# Patient Record
Sex: Male | Born: 1985
Health system: Southern US, Community
[De-identification: ages and names within clinical notes are randomized; demographics above are authoritative.]

## PROBLEM LIST (undated history)

## (undated) DIAGNOSIS — I10 Essential (primary) hypertension: Secondary | ICD-10-CM

## (undated) DIAGNOSIS — M869 Osteomyelitis, unspecified: Secondary | ICD-10-CM

## (undated) DIAGNOSIS — E119 Type 2 diabetes mellitus without complications: Secondary | ICD-10-CM

## (undated) HISTORY — PX: NO PAST SURGERIES: SHX2092

---

## 2013-09-25 ENCOUNTER — Encounter (HOSPITAL_COMMUNITY): Payer: Self-pay | Admitting: Emergency Medicine

## 2013-09-25 ENCOUNTER — Emergency Department (HOSPITAL_COMMUNITY)
Admission: EM | Admit: 2013-09-25 | Discharge: 2013-09-26 | Disposition: A | Discharge status: Home / Self Care | Payer: Self-pay | Attending: Emergency Medicine | Admitting: Emergency Medicine

## 2013-09-25 DIAGNOSIS — R197 Diarrhea, unspecified: Secondary | ICD-10-CM | POA: Insufficient documentation

## 2013-09-25 DIAGNOSIS — J029 Acute pharyngitis, unspecified: Secondary | ICD-10-CM | POA: Insufficient documentation

## 2013-09-25 DIAGNOSIS — J020 Streptococcal pharyngitis: Secondary | ICD-10-CM | POA: Insufficient documentation

## 2013-09-25 DIAGNOSIS — R112 Nausea with vomiting, unspecified: Secondary | ICD-10-CM | POA: Insufficient documentation

## 2013-09-25 MED ORDER — ONDANSETRON HCL 4 MG PO TABS
4.0000 mg | ORAL_TABLET | Freq: Once | ORAL | Status: AC
  Administered 2013-09-26: 4 mg via ORAL
  Filled 2013-09-25: qty 1

## 2013-09-25 NOTE — ED Notes (Signed)
Pt reports sore throat and n/v/d x 24 hours.  Pt reports hx of strep last year.

## 2013-09-25 NOTE — ED Provider Notes (Signed)
CSN: [id number]     Arrival date & time 09/25/13  2238 History   First MD Initiated Contact with Patient 09/25/13 2324     Chief Complaint  Patient presents with  . Sore Throat  . Vomiting  . Diarrhea    HPI Patient presents with sore throat and N/V/D since yesterday. Pain has been constant. He is able to keep down fluids and 2 meals today. Emesis is nonbilious, nonbloody. Patient reports diarrhea without blood. Patient is hydrating with Gatorade. Patient reports having strep last year with a similar presentation. Patient denies cough, rhinorrhea but some congestion. Patient denies palpitations, CP, dyspnea, abdominal pain, weakness.  History reviewed. No pertinent past medical history. History reviewed. No pertinent past surgical history. No family history on file. History  Substance Use Topics  . Smoking status: Never Smoker   . Smokeless tobacco: Not on file  . Alcohol Use: No    Review of Systems  Constitutional: Positive for chills. Negative for fever.  HENT: Positive for congestion. Negative for rhinorrhea.   Respiratory: Negative for cough and shortness of breath.   Cardiovascular: Negative for chest pain and palpitations.  Gastrointestinal: Positive for nausea, vomiting and diarrhea. Negative for abdominal pain and blood in stool.  Genitourinary: Negative for dysuria and hematuria.  Musculoskeletal: Negative for back pain.  Neurological: Negative for weakness.      Allergies  Review of patient's allergies indicates no known allergies.  Home Medications   Prior to Admission medications   Medication Sig Start Date End Date Taking? Authorizing Provider  amoxicillin (AMOXIL) 500 MG capsule Take 1 capsule (500 mg total) by mouth 2 (two) times daily. 09/26/13   Louann SjogrenVictoria L Dayane Hillenburg, PA-C  promethazine (PHENERGAN) 25 MG tablet Take 1 tablet (25 mg total) by mouth every 6 (six) hours as needed for nausea or vomiting. 09/26/13   Benetta SparVictoria L Krystelle Prashad, PA-C   BP 146/85  Pulse 88   Temp(Src) 98.8 F (37.1 C) (Oral)  Resp 18  SpO2 99% Physical Exam  Vitals reviewed. Constitutional: He is oriented to person, place, and time. He appears well-developed and well-nourished. No distress.  HENT:  Head: Normocephalic and atraumatic.  Mouth/Throat: Oropharyngeal exudate present.  Bilateral tonsillar edema and erythema with exudate on left side  Eyes: Conjunctivae and EOM are normal.  Neck: Neck supple.  Cardiovascular: Normal rate, regular rhythm and normal heart sounds.   Pulmonary/Chest: Effort normal and breath sounds normal. No respiratory distress. He has no wheezes.  Abdominal: Soft. Bowel sounds are normal. He exhibits no distension. There is no hepatosplenomegaly. There is no tenderness.  Musculoskeletal: Normal range of motion. He exhibits no edema and no tenderness.  Lymphadenopathy:    He has no cervical adenopathy.  Neurological: He is alert and oriented to person, place, and time.  Skin: Skin is warm and dry. He is not diaphoretic.    ED Course  Procedures (including critical care time) Labs Review Labs Reviewed  RAPID STREP SCREEN - Abnormal; Notable for the following:    Streptococcus, Group A Screen (Direct) POSITIVE (*)    All other components within normal limits    Imaging Review No results found.   EKG Interpretation None      MDM   Final diagnoses:  Streptococcal pharyngitis   Patient with PMH of strep pharyngitis presents with one day history of sore throat, N/V/D. Patient is febrile, 100.3 and tachycardic but in is nontoxic and in no distress. Vomiting is not intractable. Patient is tolerating solids and liquids. Rapid  strep positive. Patient is a good candidate for outpatient therapy and is stable for discharge. Continue hydration with fluids with electrolytes. Phenergan PRN. Follow up with PCP.  Discussed return precautions with patient. Discussed all results and patient verbalizes understanding and agrees with  plan.     Louann Sjogren, PA-C 09/26/13 [phone]

## 2013-09-26 LAB — RAPID STREP SCREEN (MED CTR MEBANE ONLY): Streptococcus, Group A Screen (Direct): POSITIVE — AB

## 2013-09-26 MED ORDER — PROMETHAZINE HCL 25 MG PO TABS: 25.0000 mg | ORAL_TABLET | Freq: Four times a day (QID) | ORAL | Status: DC | PRN

## 2013-09-26 MED ORDER — AMOXICILLIN 500 MG PO CAPS
500.0000 mg | ORAL_CAPSULE | Freq: Once | ORAL | Status: AC
  Administered 2013-09-26: 500 mg via ORAL
  Filled 2013-09-26: qty 1

## 2013-09-26 MED ORDER — AMOXICILLIN 500 MG PO CAPS: 500.0000 mg | ORAL_CAPSULE | Freq: Two times a day (BID) | ORAL | Status: DC

## 2013-09-26 NOTE — ED Provider Notes (Signed)
Pharyngitis with associated diarrhea and occasional nausea and vomiting, on exam has purulent exudate in the posterior pharynx, soft abdomen with normal bowel sounds and is well appearing otherwise. Strep is positive, outpatient therapy, diarrhea will be chewed with symptomatic care, patient expresses understanding.  Medical screening examination/treatment/procedure(s) were conducted as a shared visit with non-physician practitioner(s) and myself.  I personally evaluated the patient during the encounter.  Clinical Impression:  Strep pharyngitis      Vida RollerBrian D Quandarius Nill, MD 09/26/13 [phone]

## 2013-09-26 NOTE — Discharge Instructions (Signed)
Return to the emergency room with worsening of symptoms or with symptoms that are concerning, Especially difficulty breathing or swallowing.

## 2013-12-05 ENCOUNTER — Emergency Department (HOSPITAL_COMMUNITY)
Admission: EM | Admit: 2013-12-05 | Discharge: 2013-12-05 | Disposition: A | Discharge status: Home / Self Care | Payer: Self-pay | Attending: Emergency Medicine | Admitting: Emergency Medicine

## 2013-12-05 ENCOUNTER — Encounter (HOSPITAL_COMMUNITY): Payer: Self-pay | Admitting: Emergency Medicine

## 2013-12-05 DIAGNOSIS — J020 Streptococcal pharyngitis: Secondary | ICD-10-CM | POA: Insufficient documentation

## 2013-12-05 DIAGNOSIS — Z792 Long term (current) use of antibiotics: Secondary | ICD-10-CM | POA: Insufficient documentation

## 2013-12-05 LAB — RAPID STREP SCREEN (MED CTR MEBANE ONLY): Streptococcus, Group A Screen (Direct): POSITIVE — AB

## 2013-12-05 MED ORDER — PENICILLIN V POTASSIUM 500 MG PO TABS: 500.0000 mg | ORAL_TABLET | Freq: Four times a day (QID) | ORAL | Status: AC

## 2013-12-05 MED ORDER — GUAIFENESIN 100 MG/5ML PO SYRP: 100.0000 mg | ORAL_SOLUTION | ORAL | Status: DC | PRN

## 2013-12-05 NOTE — Discharge Instructions (Signed)
Pharyngitis °Pharyngitis is redness, pain, and swelling (inflammation) of your pharynx.  °CAUSES  °Pharyngitis is usually caused by infection. Most of the time, these infections are from viruses (viral) and are part of a cold. However, sometimes pharyngitis is caused by bacteria (bacterial). Pharyngitis can also be caused by allergies. Viral pharyngitis may be spread from person to person by coughing, sneezing, and personal items or utensils (cups, forks, spoons, toothbrushes). Bacterial pharyngitis may be spread from person to person by more intimate contact, such as kissing.  °SIGNS AND SYMPTOMS  °Symptoms of pharyngitis include:   °· Sore throat.   °· Tiredness (fatigue).   °· Low-grade fever.   °· Headache. °· Joint pain and muscle aches. °· Skin rashes. °· Swollen lymph nodes. °· Plaque-like film on throat or tonsils (often seen with bacterial pharyngitis). °DIAGNOSIS  °Your health care provider will ask you questions about your illness and your symptoms. Your medical history, along with a physical exam, is often all that is needed to diagnose pharyngitis. Sometimes, a rapid strep test is done. Other lab tests may also be done, depending on the suspected cause.  °TREATMENT  °Viral pharyngitis will usually get better in 3-4 days without the use of medicine. Bacterial pharyngitis is treated with medicines that kill germs (antibiotics).  °HOME CARE INSTRUCTIONS  °· Drink enough water and fluids to keep your urine clear or pale yellow.   °· Only take over-the-counter or prescription medicines as directed by your health care provider:   °¨ If you are prescribed antibiotics, make sure you finish them even if you start to feel better.   °¨ Do not take aspirin.   °· Get lots of rest.   °· Gargle with 8 oz of salt water (½ tsp of salt per 1 qt of water) as often as every 1-2 hours to soothe your throat.   °· Throat lozenges (if you are not at risk for choking) or sprays may be used to soothe your throat. °SEEK MEDICAL  CARE IF:  °· You have large, tender lumps in your neck. °· You have a rash. °· You cough up green, yellow-brown, or bloody spit. °SEEK IMMEDIATE MEDICAL CARE IF:  °· Your neck becomes stiff. °· You drool or are unable to swallow liquids. °· You vomit or are unable to keep medicines or liquids down. °· You have severe pain that does not go away with the use of recommended medicines. °· You have trouble breathing (not caused by a stuffy nose). °MAKE SURE YOU:  °· Understand these instructions. °· Will watch your condition. °· Will get help right away if you are not doing well or get worse. °Document Released: 02/05/2005 Document Revised: 11/26/2012 Document Reviewed: 10/13/2012 °ExitCare® Patient Information ©2015 [employer]. This information is not intended to replace advice given to you by your health care provider. Make sure you discuss any questions you have with your health care provider. ° ° °Emergency Department Resource Guide °1) Find a Doctor and Pay Out of Pocket °Although you won't have to find out who is covered by your insurance plan, it is a good idea to ask around and get recommendations. You will then need to call the office and see if the doctor you have chosen will accept you as a new patient and what types of options they offer for patients who are self-pay. Some doctors offer discounts or will set up payment plans for their patients who do not have insurance, but you will need to ask so you aren't surprised when you get to your appointment. ° °  2) Contact Your Local Health Department °Not all health departments have doctors that can see patients for sick visits, but many do, so it is worth a call to see if yours does. If you don't know where your local health department is, you can check in your phone book. The CDC also has a tool to help you locate your state's health department, and many state websites also have listings of all of their local health departments. ° °3) Find a Walk-in Clinic °If  your illness is not likely to be very severe or complicated, you may want to try a walk in clinic. These are popping up all over the country in pharmacies, drugstores, and shopping centers. They're usually staffed by nurse practitioners or physician assistants that have been trained to treat common illnesses and complaints. They're usually fairly quick and inexpensive. However, if you have serious medical issues or chronic medical problems, these are probably not your best option. ° °No Primary Care Doctor: °- Call [facility] at  832-8000 - they can help you locate a primary care doctor that  accepts your insurance, provides certain services, etc. °- Physician Referral Service- 1-800-533-3463 ° °Chronic Pain Problems: °Organization         Address  Phone   Notes  °[facility]  (336) 297-2271 Patients need to be referred by their primary care doctor.  ° °Medication Assistance: °Organization         Address  Phone   Notes  °[place] [facility] [address] °[place], [place] [address] (336) 641-8030 --Must be a resident of [place] °-- Must have NO insurance coverage whatsoever (no Medicaid/ Medicare, etc.) °-- The pt. MUST have a primary care doctor that directs their care regularly and follows them in the community °  °[facility]  (866) 331-1348   °[employer]  (888) 892-1162   ° °Agencies that provide inexpensive medical care: °Organization         Address  Phone   Notes  °[facility]  (336) 832-8035   °[facility]    (336) 832-7272   °[facility] [address] °[place], [place] [address] (336) 832-4777   °[facility] [address], °[place] (336) 271-4999   °[facility]    (336) 373-0678   °[facility]    (336) 272-1050   °[facility] ° [address], [place] Phone:  (336) 832-4444, Fax:  (336) 832-4440 Hours of  Operation:  9 am - 6 pm, M-F.  Also accepts Medicaid/Medicare and self-pay.  °[facility] ° [address], [place] Phone: (336) 832-3150, Fax: (336) 832-3151. Hours of Operation:  8:30 am - 5:30 pm, M-F.  Also accepts Medicaid and self-pay.  °[facility] [place] [address], [place] Phone: (336) 878-6027   °[facility] [address], [place], [place] (336)723-1848, Ext. 123 Mondays & Thursdays: 7-9 AM.  First 15 patients are seen on a first come, first serve basis. °  ° °Medicaid-accepting [place] Providers: ° °Organization         Address  Phone   Notes  °[facility] [address], [place] (336) 641-2100 Also accepts self-pay patients.  °[facility] [address], [place] ° (336) 856-9996   °[facility] [address], [place] (336) 288-8857   °[facility] [address] [place] [address], [place] (336) 299-7000   °Veita Bland [address], [place]  ° (336) 373-1557 Only accepts [place] Access Medicaid patients after they have their name applied to their card.  ° °Self-Pay (no insurance) in [place]: ° °Organization         Address  Phone   Notes  °Sickle Cell Patients, [facility] [address], [place] (336) 832-1970   °[facility] [address], [place] (336) 832-4400   °[facility] [place] ° [address], [place] (336) 992-4800   °[facility]/Dr. Osei-Bonsu ° [address] [place] [address], [place] or [address], [place] (336) 841-8500 Phone number for both [place] and [place] locations is the same.  °[facility] [address], [place] (336) 299-0000   °[facility] [address] [place] [address], [place] or [address] (336) 852-7530 °(336) 878-2260   °[facility] [address], [place] (336) 350-1642, phone; (336) 294-5005, fax Sees patients 1st and 3rd Saturday of every month.  Must not qualify for public or private insurance (i.e. Medicaid, Medicare, [place] Health Choice, Veterans' Benefits) • Household income should be no more than 200% of the poverty level •The clinic cannot treat you if you are pregnant or think you are pregnant • Sexually transmitted diseases are not treated at the clinic.  ° ° °Dental Care: °Organization         Address  Phone  Notes  °[facility] [address], [place] (336) 641-6152 Accepts children up to age 21 who are enrolled in Medicaid or [place] Health Choice; pregnant women with a Medicaid card; and children who have applied for Medicaid or [place] Health Choice, but were declined, whose parents can pay a reduced fee at time of service.  °[facility] [place]  [address], [place] (336) 641-7733 Accepts children up to age 21 who are enrolled in Medicaid or [place] Health Choice; pregnant women with a Medicaid card; and children who have applied for Medicaid or [place] Health Choice, but were declined, whose parents can pay a reduced fee at time of service.  °[facility] ° [address], [place] (336) 641-4533 Patients are seen by appointment only. Walk-ins are not accepted. [facility] will see patients 18 years of age and older. °Monday - Tuesday (8am-5pm) °Most Wednesdays (8:30-5pm) °$30 per visit, cash only  °[facility] ° [address], [place] (336) 641-4533 Patients are seen by appointment only. Walk-ins are not accepted. [facility] will see patients 18 years of age and older. °One Wednesday Evening (Monthly: Volunteer Based).  $30 per visit, cash only  °[facility]  (919) 537-3737 for adults; Children under age 4, call Graduate Pediatric  Dentistry at ([phone]) [phone]. Children aged 4-14, please call (919) [phone] to request a pediatric application. ° Dental services are provided in all areas of dental care including fillings, crowns and bridges, complete and partial dentures, implants, gum treatment, root canals, and extractions. Preventive care is also provided. Treatment is provided to both adults and children. °Patients are selected via a lottery and there is often a waiting list. °  °[facility] [address], °  [place] ° (336) 763-8833 [email or web address] °  °[facility] [address], [place], [place] (336)723-1848, Ext. 123 Second and Fourth Thursday of each month, opens at 6:30 AM; Clinic ends at 9 AM.  Patients are seen on a first-come first-served basis, and a limited number are seen during each clinic.  ° °[facility] ° [address], [place], [place] (336) 723-7904   Eligibility Requirements °You must have lived in [place], [place], or [place] counties for at least the last three months. °  You cannot be eligible for state or federal sponsored healthcare insurance, including Veterans Administration, Medicaid, or Medicare. °  You generally cannot be eligible for healthcare insurance through your employer.  °  How to apply: °Eligibility screenings are held every Tuesday and Wednesday afternoon from 1:00 pm until 4:00 pm. You do not need an appointment for the interview!  °[facility] [address], [place], [place] 336-631-2330   °[facility]  336-342-8273   °[facility]  336-703-3100   °[facility]  336-570-6415   ° °Behavioral Health Resources in the Community: °Intensive Outpatient Programs °Organization         Address  Phone  Notes  °[facility] [address], [place], [place] 336-878-6098   °[facility] [address], [place], [place] 336-832-9800   °[facility] [address], [place], [place] ° 336-882-2125   °[facility] [address],  °[place], [place] 1-800-853-5163 or 336-641-4981   °Substance Abuse Resources °Organization         Address  Phone  Notes  °[facility]  336-882-2125   °[facility]  336-784-9470   °[facility]  336-285-9073   °[facility]  336-845-3988   °Residential & Outpatient Substance Abuse Program  1-800-659-3381   °Psychological Services °Organization         Address  Phone  Notes  °[facility]  336- 832-9600   °[facility]  336- 378-7881   °[facility] [address], [place] 1-800-853-5163 or 336-641-4981   ° °Mobile Crisis Teams °Organization         Address  Phone  Notes  °[facility], Mobile Crisis Care Unit  1-877-626-1772   °Assertive °[facility] ° [address] [place], [place] 336-834-9664   °Sharon DeEsch [address] °[place] 336-554-5454   ° °Self-Help/Support Groups °Organization         Address  Phone             Notes  °[facility] [place] - variety of support groups  336- 373-1402 Call for more information  °Narcotics Anonymous (NA), [facility] [address], °[place]  2 meetings at this location  ° °Residential Treatment Programs °Organization         Address  Phone  Notes  °[facility] [address],    °[place]  1-866-801-8205   °[facility] ° [address], [place], [place] 704-293-8524   °[facility] [address], [place] 336-845-3988 Admissions: 8am-3pm M-F  °[facility] [address]    °[place], [place] 336-841-1104   °[facility] [address], [place],  336-379-7146   °[facility] [address]  °[place],  336-285-9073   °[facility] - Intensive Outpatient [address],  [place], [place] 336-852-3033   °[facility] [address]  °[place], [place] 1-877-615-2722 or 336-784-9470   °[facility] [address] [place], [place] 336-227-7417 Accepts Medicaid  °[facility] [address]  °[place] 1-800-659-3381 Substance Abuse/Addiction Treatment  ° °[place] Behavioral Health Resources °Organization         Address  Phone  Notes  °[facility]  (888) 581-9988   °Julie Brannon, PhD [address] [place], [place]   (336) 349-5553 or (336) 951-0000   °[facility]   [address] °[place], [place] (336) 349-4454   °[facility] [address], [place], [place] (336) 342-8316 Insurance/Medicaid/sponsorship through [facility]  °[facility] [address]                                    [place], [place] (336) 342-8316 Therapy/tele-psych/case  °[facility] [address]  ° [place], [place] (336) 349-2233    °Dr. Arfeen  (336) 349-4544   °[facility] [place]  [employer] [place] [facility] 1) [address], [place] °2) [address], [place] °3)  [address], [place] (336) 349-3220 °(336) 342-7768 ° °(336) 342-8140   °[place] [facility] (336) 342-1394 or (336) 342-3537 (After Hours)    ° ° ° ° °

## 2013-12-05 NOTE — ED Notes (Signed)
Per pt, states cold symptoms since thurs-throat pain since this am

## 2013-12-05 NOTE — ED Provider Notes (Signed)
CSN: [id number]     Arrival date & time 12/05/13  1043 History   First MD Initiated Contact with Patient 12/05/13 1107     Chief Complaint  Patient presents with  . URI     (Consider location/radiation/quality/duration/timing/severity/associated sxs/prior Treatment) HPI Jerry Arnold is a 28 y.o. male with no significant past medical history who comes in for evaluation of cough and throat pain. Patient states on Thursday he began to have a mild cough that has been persistent through today and now he has an associated sore throat. He has tried TheraFlu and Halls throat lozenges with some relief. He denies productive or bloody cough. No chest pain, shortness of breath or abdominal pain. No sick contacts  History reviewed. No pertinent past medical history. History reviewed. No pertinent past surgical history. No family history on file. History  Substance Use Topics  . Smoking status: Never Smoker   . Smokeless tobacco: Not on file  . Alcohol Use: No    Review of Systems  Constitutional: Negative for fever.  HENT: Positive for sore throat.   Respiratory: Positive for cough. Negative for shortness of breath.   Cardiovascular: Negative for chest pain.  Skin: Negative for rash.      Allergies  Review of patient's allergies indicates no known allergies.  Home Medications   Prior to Admission medications   Medication Sig Start Date End Date Taking? Authorizing Provider  amoxicillin (AMOXIL) 500 MG capsule Take 1 capsule (500 mg total) by mouth 2 (two) times daily. 09/26/13   Louann SjogrenVictoria L Creech, PA-C  promethazine (PHENERGAN) 25 MG tablet Take 1 tablet (25 mg total) by mouth every 6 (six) hours as needed for nausea or vomiting. 09/26/13   Benetta SparVictoria L Creech, PA-C   BP 152/101  Pulse 86  Temp(Src) 99 F (37.2 C) (Oral)  Resp 16  SpO2 100% Physical Exam  Nursing note and vitals reviewed. Constitutional:  Awake, alert, nontoxic appearance.  HENT:  Head: Normocephalic and  atraumatic.  Right Ear: External ear normal.  Left Ear: External ear normal.  Nose: Nose normal.  Mouth/Throat: Oropharynx is clear and moist. No oropharyngeal exudate.  Mild posterior oropharynx erythema with no tonsillar enlargement. No exudate appreciated. Uvula midline. Handling secretions well. Patent airway  Eyes: Right eye exhibits no discharge. Left eye exhibits no discharge.  Neck: Neck supple.  Pulmonary/Chest: Effort normal. He exhibits no tenderness.  Abdominal: Soft. There is no tenderness. There is no rebound.  Musculoskeletal: He exhibits no tenderness.  Baseline ROM, no obvious new focal weakness.  Neurological:  Mental status and motor strength appears baseline for patient and situation.  Skin: No rash noted.  Psychiatric: He has a normal mood and affect.    ED Course  Procedures (including critical care time) Labs Review Labs Reviewed  RAPID STREP SCREEN    Imaging Review No results found.   EKG Interpretation None      MDM  Vitals stable  -afebrile. Recommended outpatient followup of blood pressure Pt resting comfortably in ED. PE not concerning for acute or emergent pathology. [facility] or criteria negative Labwork--rapid strep POSITIVE. Will trxt empirically.  Will treat cough and sore throat symptomatically as etiology is likely viral Will DC with Robitussin cough syrup , ABX and encourage warm salt water gargles Discussed f/u with PCP and return precautions, pt very amenable to plan. Patient stable, in good condition and is appropriate for discharge   Final diagnoses:  Strep pharyngitis        Sharlene MottsBenjamin W Arelly Whittenberg,  PA-C 12/05/13 1245

## 2013-12-05 NOTE — ED Provider Notes (Signed)
Medical screening examination/treatment/procedure(s) were performed by non-physician practitioner and as supervising physician I was immediately available for consultation/collaboration.   EKG Interpretation None        Jerry Arnold H Laural Eiland, MD 12/05/13 1603 

## 2014-02-28 ENCOUNTER — Emergency Department (HOSPITAL_COMMUNITY)
Admission: EM | Admit: 2014-02-28 | Discharge: 2014-02-28 | Disposition: A | Discharge status: Home / Self Care | Payer: Self-pay | Attending: Emergency Medicine | Admitting: Emergency Medicine

## 2014-02-28 ENCOUNTER — Encounter (HOSPITAL_COMMUNITY): Payer: Self-pay | Admitting: Emergency Medicine

## 2014-02-28 DIAGNOSIS — J069 Acute upper respiratory infection, unspecified: Secondary | ICD-10-CM | POA: Insufficient documentation

## 2014-02-28 LAB — RAPID STREP SCREEN (MED CTR MEBANE ONLY): Streptococcus, Group A Screen (Direct): NEGATIVE

## 2014-02-28 NOTE — ED Provider Notes (Signed)
CSN: [id number]     Arrival date & time 02/28/14  0608 History   First MD Initiated Contact with Patient 02/28/14 0701     Chief Complaint  Patient presents with  . Cough  . Sore Throat     (Consider location/radiation/quality/duration/timing/severity/associated sxs/prior Treatment) HPI The patient poor she developed a cough 5 days ago. He denies any shortness of breath in association with that. He reports 2 days later he developed a sore throat. There is pain with swallowing. No difficulty breathing or swallowing. No associated fever. No associated chest pain. Occasionally with cough there is some greenish tinged mucus. History reviewed. No pertinent past medical history. History reviewed. No pertinent past surgical history. History reviewed. No pertinent family history. History  Substance Use Topics  . Smoking status: Never Smoker   . Smokeless tobacco: Not on file  . Alcohol Use: No    Review of Systems Constitutional: No fevers no chills or general malaise GI: No abdominal pain vomiting or diarrhea.  Allergies  Review of patient's allergies indicates no known allergies.  Home Medications   Prior to Admission medications   Medication Sig Start Date End Date Taking? Authorizing Provider  Phenylephrine-APAP-Guaifenesin (MUCINEX FAST-MAX COLD & SINUS) 10-650-400 MG/20ML LIQD Take 20 mLs by mouth every 6 (six) hours as needed (for cold symptoms).   Yes Historical Provider, MD  Pseudoeph-Doxylamine-DM-APAP (NYQUIL PO) Take 2 capsules by mouth at bedtime as needed (for cold symptoms).   Yes Historical Provider, MD  amoxicillin (AMOXIL) 500 MG capsule Take 1 capsule (500 mg total) by mouth 2 (two) times daily. Patient not taking: Reported on 02/28/2014 09/26/13   Louann Sjogren, PA-C  guaifenesin (ROBITUSSIN) 100 MG/5ML syrup Take 5-10 mLs (100-200 mg total) by mouth every 4 (four) hours as needed for cough. Patient not taking: Reported on 02/28/2014 12/05/13   Earle Gell Cartner,  PA-C  promethazine (PHENERGAN) 25 MG tablet Take 1 tablet (25 mg total) by mouth every 6 (six) hours as needed for nausea or vomiting. Patient not taking: Reported on 02/28/2014 09/26/13   Benetta Spar L Creech, PA-C   BP 145/107 mmHg  Pulse 94  Temp(Src) 98.2 F (36.8 C) (Oral)  Resp 16  Ht  (1.854 m)  Wt 245 lb (111.131 kg)  BMI 32.33 kg/m2  SpO2 99% Physical Exam  Constitutional: He is oriented to person, place, and time. He appears well-developed and well-nourished. No distress.  HENT:  Head: Normocephalic and atraumatic.  Nose: Nose normal.  Mouth/Throat: No oropharyngeal exudate.  Bilateral TMs normal without erythema or bulging. Neck is supple without lymphadenopathy.  Eyes: EOM are normal. Pupils are equal, round, and reactive to light. Right eye exhibits no discharge. Left eye exhibits no discharge.  Neck: Neck supple.  Cardiovascular: Normal rate, regular rhythm, normal heart sounds and intact distal pulses.   Pulmonary/Chest: Effort normal and breath sounds normal.  Musculoskeletal: Normal range of motion.  Neurological: He is alert and oriented to person, place, and time. No cranial nerve deficit. Coordination normal.  Skin: Skin is warm and dry.  Psychiatric: He has a normal mood and affect.    ED Course  Procedures (including critical care time) Labs Review Labs Reviewed  RAPID STREP SCREEN  CULTURE, GROUP A STREP    Imaging Review No results found.   EKG Interpretation None      MDM   Final diagnoses:  Acute URI   The patient is well-appearing. Rapid strep is negative. Throat shows no exudate or erythema at this  time. Lungs are clear. There is no associated respiratory distress. Patient can continue over-the-counter cold remedies as needed.    Arby BarretteMarcy Kurtiss Wence, MD 02/28/14 [phone]

## 2014-02-28 NOTE — Discharge Instructions (Signed)
Upper Respiratory Infection, Adult  Continue over-the-counter cold medications as needed for symptoms.   An upper respiratory infection (URI) is also sometimes known as the common cold. The upper respiratory tract includes the nose, sinuses, throat, trachea, and bronchi. Bronchi are the airways leading to the lungs. Most people improve within 1 week, but symptoms can last up to 2 weeks. A residual cough may last even longer.  CAUSES Many different viruses can infect the tissues lining the upper respiratory tract. The tissues become irritated and inflamed and often become very moist. Mucus production is also common. A cold is contagious. You can easily spread the virus to others by oral contact. This includes kissing, sharing a glass, coughing, or sneezing. Touching your mouth or nose and then touching a surface, which is then touched by another person, can also spread the virus. SYMPTOMS  Symptoms typically develop 1 to 3 days after you come in contact with a cold virus. Symptoms vary from person to person. They may include:  Runny nose.  Sneezing.  Nasal congestion.  Sinus irritation.  Sore throat.  Loss of voice (laryngitis).  Cough.  Fatigue.  Muscle aches.  Loss of appetite.  Headache.  Low-grade fever. DIAGNOSIS  You might diagnose your own cold based on familiar symptoms, since most people get a cold 2 to 3 times a year. Your caregiver can confirm this based on your exam. Most importantly, your caregiver can check that your symptoms are not due to another disease such as strep throat, sinusitis, pneumonia, asthma, or epiglottitis. Blood tests, throat tests, and X-rays are not necessary to diagnose a common cold, but they may sometimes be helpful in excluding other more serious diseases. Your caregiver will decide if any further tests are required. RISKS AND COMPLICATIONS  You may be at risk for a more severe case of the common cold if you smoke cigarettes, have chronic heart  disease (such as heart failure) or lung disease (such as asthma), or if you have a weakened immune system. The very young and very old are also at risk for more serious infections. Bacterial sinusitis, middle ear infections, and bacterial pneumonia can complicate the common cold. The common cold can worsen asthma and chronic obstructive pulmonary disease (COPD). Sometimes, these complications can require emergency medical care and may be life-threatening. PREVENTION  The best way to protect against getting a cold is to practice good hygiene. Avoid oral or hand contact with people with cold symptoms. Wash your hands often if contact occurs. There is no clear evidence that vitamin C, vitamin E, echinacea, or exercise reduces the chance of developing a cold. However, it is always recommended to get plenty of rest and practice good nutrition. TREATMENT  Treatment is directed at relieving symptoms. There is no cure. Antibiotics are not effective, because the infection is caused by a virus, not by bacteria. Treatment may include:  Increased fluid intake. Sports drinks offer valuable electrolytes, sugars, and fluids.  Breathing heated mist or steam (vaporizer or shower).  Eating chicken soup or other clear broths, and maintaining good nutrition.  Getting plenty of rest.  Using gargles or lozenges for comfort.  Controlling fevers with ibuprofen or acetaminophen as directed by your caregiver.  Increasing usage of your inhaler if you have asthma. Zinc gel and zinc lozenges, taken in the first 24 hours of the common cold, can shorten the duration and lessen the severity of symptoms. Pain medicines may help with fever, muscle aches, and throat pain. A variety of non-prescription  medicines are available to treat congestion and runny nose. Your caregiver can make recommendations and may suggest nasal or lung inhalers for other symptoms.  HOME CARE INSTRUCTIONS   Only take over-the-counter or prescription  medicines for pain, discomfort, or fever as directed by your caregiver.  Use a warm mist humidifier or inhale steam from a shower to increase air moisture. This may keep secretions moist and make it easier to breathe.  Drink enough water and fluids to keep your urine clear or pale yellow.  Rest as needed.  Return to work when your temperature has returned to normal or as your caregiver advises. You may need to stay home longer to avoid infecting others. You can also use a face mask and careful hand washing to prevent spread of the virus. SEEK MEDICAL CARE IF:   After the first few days, you feel you are getting worse rather than better.  You need your caregiver's advice about medicines to control symptoms.  You develop chills, worsening shortness of breath, or brown or red sputum. These may be signs of pneumonia.  You develop yellow or brown nasal discharge or pain in the face, especially when you bend forward. These may be signs of sinusitis.  You develop a fever, swollen neck glands, pain with swallowing, or white areas in the back of your throat. These may be signs of strep throat. SEEK IMMEDIATE MEDICAL CARE IF:   You have a fever.  You develop severe or persistent headache, ear pain, sinus pain, or chest pain.  You develop wheezing, a prolonged cough, cough up blood, or have a change in your usual mucus (if you have chronic lung disease).  You develop sore muscles or a stiff neck. Document Released: 08/01/2000 Document Revised: 04/30/2011 Document Reviewed: 05/13/2013 [facility] Patient Information 2015 [place], [place]. This information is not intended to replace advice given to you by your health care provider. Make sure you discuss any questions you have with your health care provider.  Emergency Department Resource Guide 1) Find a Doctor and Pay Out of Pocket Although you won't have to find out who is covered by your insurance plan, it is a good idea to ask around and get  recommendations. You will then need to call the office and see if the doctor you have chosen will accept you as a new patient and what types of options they offer for patients who are self-pay. Some doctors offer discounts or will set up payment plans for their patients who do not have insurance, but you will need to ask so you aren't surprised when you get to your appointment.  2) Contact Your Local Health Department Not all health departments have doctors that can see patients for sick visits, but many do, so it is worth a call to see if yours does. If you don't know where your local health department is, you can check in your phone book. The CDC also has a tool to help you locate your state's health department, and many state websites also have listings of all of their local health departments.  3) Find a Walk-in Clinic If your illness is not likely to be very severe or complicated, you may want to try a walk in clinic. These are popping up all over the country in pharmacies, drugstores, and shopping centers. They're usually staffed by nurse practitioners or physician assistants that have been trained to treat common illnesses and complaints. They're usually fairly quick and inexpensive. However, if you have serious medical issues  or chronic medical problems, these are probably not your best option.  No Primary Care Doctor: - Call Health Connect at  [phone] - they can help you locate a primary care doctor that  accepts your insurance, provides certain services, etc. - Physician Referral Service- [phone]  Chronic Pain Problems: Organization         Address  Phone   Notes  [facility]  [phone] Patients need to be referred by their primary care doctor.   Medication Assistance: Organization         Address  Phone   Notes  [facility] Medication [facility] [address] [place] [place], [place] [address] [phone] --Must be a resident of  [facility] -- Must have NO insurance coverage whatsoever (no Medicaid/ Medicare, etc.) -- The pt. MUST have a primary care doctor that directs their care regularly and follows them in the community   MedAssist  [phone]   [employer]  [phone]    Agencies that provide inexpensive medical care: Organization         Address  Phone   Notes  [facility]  [phone]   [facility]    [phone]   [facility] [address] [place], [place] [address] [phone]   [facility] [address] [place] [phone]   Planned Parenthood    [phone]   [facility]    [phone]   [facility]  [address], [place] Phone:  [phone], Fax:  [phone] Hours of Operation:  9 am - 6 pm, M-F.  Also accepts Medicaid/Medicare and self-pay.  [facility]  [address], [place] Phone: [phone], Fax: [phone]. Hours of Operation:  8:30 am - 5:30 pm, M-F.  Also accepts Medicaid and self-pay.  [facility] [place] [address], [place] Phone: [phone]   [facility] [address] Natasha Bence [place], [place] [phone], Ext. [phone] Mondays & Thursdays: 7-9 AM.  First 15 patients are seen on a first come, first serve basis.    Medicaid-accepting [facility] Providers:  Organization         Address  Phone   Notes  [facility] [address], [place] [phone] Also accepts self-pay patients.  [facility] [address] Laurell Josephs [place], [place]  [phone]   [facility] [address], [place] [phone]   [facility] [address] [place] [phone]   Renaye Rakers [address], [place]   [phone] Only accepts [place] Access  [place] patients after they have their name applied to their card.   Self-Pay (no insurance) in [facility]:  Organization         Address  Phone   Notes  Sickle Cell Patients, [facility] [address] [place], [place] [phone]   [facility] Care [address] [place], [place] [phone]   [facility] Olivia Lopez de Gutierrez  [address], [place] [phone]   [facility]/Dr. Osei-Bonsu  [address], [place] or [address], [place] [phone] Phone number for both [place] and [place] locations is the same.  [facility]gent Medical and [facility] [address], [place] [phone]   [facility] [address] [place] or [address] [phone]   [facility] [address] [place] [phone], phone; [phone], fax Sees patients 1st and 3rd Saturday of every month.  Must not qualify for public or private insurance (i.e. Medicaid, Medicare, [place] Health Choice, Veterans' Benefits)  Household income should be no more than 200% of the poverty level The clinic cannot treat you if you are pregnant or think you are pregnant  Sexually transmitted diseases are not treated at the clinic.    Dental Care: Organization         Address  Phone  Notes  [facility] Department of [facility] [address] [place], [place] [phone] Accepts children up to age 54 who are enrolled in [place] or [place] Health Choice; pregnant women with a Medicaid card; and children who have applied for Medicaid or [place] Health Choice, but were declined, whose parents can pay a reduced fee at time of service.  [facility] Department of [facility]  [address], [place] [phone] Accepts children up to age 63 who are enrolled in [place] or [place] Health Choice; pregnant women with a Medicaid  card; and children who have applied for Medicaid or [place] Health Choice, but were declined, whose parents can pay a reduced fee at time of service.  [facility]  [address] [place] [address] [place], [place] [phone] Patients are seen by appointment only. Walk-ins are not accepted. [facility] will see patients 76 years of age and older. Monday - Tuesday (8am-5pm) Most Wednesdays (8:30-5pm) $30 per visit, cash only  [facility] Adult Dental Access PROGRAM  [address], [facility] [phone] Patients are seen by appointment only. Walk-ins are not accepted. [facility] will see patients 35 years of age and older. One Wednesday Evening (Monthly: Volunteer Based).  $30 per visit, cash only  [employer]  [phone] for adults; Children under age 36, call Graduate Pediatric Dentistry at [phone]. Children aged 32-14, please call [phone] to request a pediatric application.  Dental services are provided in all areas of dental care including fillings, crowns and bridges, complete and partial dentures, implants, gum treatment, root canals, and extractions. Preventive care is also provided. Treatment is provided to both adults and children. Patients are selected via a lottery and there is often a waiting list.   [facility] [address] [place]  [phone] www.drcivils.com   [facility] [address] [place], [place] [phone], Ext. [phone] Second and Fourth Thursday of each month, opens at 6:30 AM; Clinic ends at 9 AM.  Patients are seen on a first-come first-served basis, and a limited number are seen during each clinic.   [facility]  [address] [place], [place] [phone]   Eligibility Requirements You must have lived in [place], [place], or [place] counties for at least the last three months.   You cannot be eligible for state or federal sponsored [place],  including [employer], [place], or [employer].   You generally cannot be eligible for healthcare insurance through your employer.    How to apply: Eligibility screenings are held every Tuesday and Wednesday afternoon from 1:00 pm until 4:00 pm. You do not need an appointment for the interview!  [facility] [address], [place], [place] [phone]   [facility] Health Department  [phone]   [facility] Health Department  [phone]   [facility] Health Department  [phone]    Behavioral Health Resources in the Community: Intensive Outpatient Programs Organization         Address  Phone  Notes  [facility] [address], [place], [place] [phone]   [facility] Outpatient [address] [place], [place] [phone]   [facility] [address] [place], [place]  [phone]   [facility] Mental Health [address],  [place], [place] [phone] or [phone]   Substance Abuse Resources Organization         Address  Phone  Notes  [facility]  [phone]   [facility]  [phone]   [facility] [place]  [phone]   Floydene Flock  [phone]   Residential & Outpatient Substance Abuse Program  [phone]   Psychological Services Organization         Address  Phone  Notes  [facility] Behavioral Health  [phone]   [facility]  [phone]   [facility] Mental Health [address] [place] [phone] or [phone]    Mobile Crisis Teams Organization         Address  Phone  Notes  [facility], Mobile Crisis Care Unit  [phone]   [facility]  [address] [place], [place] [phone]   Doristine Locks [address] [place] [phone]    Self-Help/Support Groups Organization         Address  Phone             Notes  [facility]ental [facility] Assoc.  of [place] - variety of support groups  336- I7437963 Call for more information  Narcotics Anonymous (NA), [facility] [address], [employer] [place]  2 meetings at this location   Statistician         Address  Phone  Notes  [facility] [address],    [place]  [phone]   [facility]  [address], [place] [address], [place], [place] [phone]   [facility] [address] [place], [place] [phone] Admissions: 8am-3pm M-F  [facility] [address],    [place], [place] [phone]   [facility] [address] [place], [place], [place] [phone]   [facility] [address],  [place], [place] [phone]   [facility] - Intensive Outpatient [address]ure[address] Joseph[address], [place], [place] [phone]   [facility] ([facility]iction [facility] [address] [place]  [place], [place] [phone] or [phone]   [facility] ([facility]) [address], [place], [place] [phone] Accepts Medicaid  [facility] [address],  [place] [phone] Substance Abuse/Addiction Treatment   [facility] Organization         Address  Phone  Notes  [facility]  [phone]   Angie Fava, PhD [address] Ervin Knack [place], [place]   [phone] or [phone]   [facility] Behavioral   [address] [place], [place] [phone]   [facility] [address] [place], [place] [phone] Insurance/Medicaid/sponsorship through [employer] and Families [address]  [place], [place] [phone] [place], [place] [phone]    Dr. Adele Schilder  [phone]   [facility] Dept. 1) [address], [place] 2)  [place] 3)  [address], [place] [phone]  [phone]   [place] [phone] or [phone] (After Hours)

## 2014-02-28 NOTE — ED Notes (Signed)
Pt arrived to the ED with a complaint of a cough which has lead to a sore throat.  Pt states that the cough is slightly productive with clear greenish thick mucous.  Pt states cough symptoms have been present since Wednesday last.  Pt states sore throat has been present since Friday

## 2014-03-03 LAB — CULTURE, GROUP A STREP

## 2014-06-16 ENCOUNTER — Encounter (HOSPITAL_COMMUNITY): Payer: Self-pay | Admitting: Emergency Medicine

## 2014-06-16 ENCOUNTER — Emergency Department (HOSPITAL_COMMUNITY)
Admission: EM | Admit: 2014-06-16 | Discharge: 2014-06-16 | Disposition: A | Discharge status: Home / Self Care | Payer: Self-pay | Attending: Emergency Medicine | Admitting: Emergency Medicine

## 2014-06-16 DIAGNOSIS — Z792 Long term (current) use of antibiotics: Secondary | ICD-10-CM | POA: Insufficient documentation

## 2014-06-16 DIAGNOSIS — J020 Streptococcal pharyngitis: Secondary | ICD-10-CM | POA: Insufficient documentation

## 2014-06-16 LAB — RAPID STREP SCREEN (MED CTR MEBANE ONLY): Streptococcus, Group A Screen (Direct): POSITIVE — AB

## 2014-06-16 MED ORDER — IBUPROFEN 800 MG PO TABS: 800.0000 mg | ORAL_TABLET | Freq: Three times a day (TID) | ORAL | Status: DC | PRN

## 2014-06-16 MED ORDER — AMOXICILLIN 500 MG PO CAPS: 500.0000 mg | ORAL_CAPSULE | Freq: Three times a day (TID) | ORAL | Status: DC

## 2014-06-16 MED ORDER — HYDROCODONE-ACETAMINOPHEN 7.5-325 MG/15ML PO SOLN: 10.0000 mL | Freq: Four times a day (QID) | ORAL | Status: DC | PRN

## 2014-06-16 NOTE — Progress Notes (Signed)
29 yr old self pay [place] c/o nasal congestion CM spoke with pt who confirms self [place] with no pcp. CM discussed and provided written information for self pay pcps vs EDPs, importance of pcp for f/u care, www.needymeds.org, www.goodrx.com, discounted pharmacies and other [place] resources such as [facility] , [facility], affordable care act,  financial assistance, self pay dental services, [place] med assist, DSS and  health department  Reviewed resources for [place] self pay pcps like [facility]ovita [facility] [place], family medicine at [address], [facility], general medical clinics, [facility] urgent care plus others, medication resources, [facility] out patient pharmacies and housing Pt voiced understanding and appreciation of resources provided   Provided [facility] contact information Pt agreed to a referral Cm completed referral

## 2014-06-16 NOTE — ED Notes (Addendum)
BP 151/108 reported to RN [employer] RubbermaidJennifer Hamilton

## 2014-06-16 NOTE — ED Notes (Signed)
Patient requested to be re-swabed for Rapid Strep

## 2014-06-16 NOTE — ED Provider Notes (Signed)
CSN: [id number]     Arrival date & time 06/16/14  0945 History   First MD Initiated Contact with Patient 06/16/14 1003     Chief Complaint  Patient presents with  . Sore Throat     (Consider location/radiation/quality/duration/timing/severity/associated sxs/prior Treatment) HPI   Patient presents with four days of sore throat, nasal congestion, rhinorrhea, sneezing, mild headache.  Sore throat is described as sharp and sore, worse with swallowing.  Denies fevers, chills, myalgias, cough, SOB, facial pain, ear pain.  Has taken chloraseptic and mucinex without improvement.  Has had friends with similar symptoms recently.  Has personal hx strep throat and thinks this feels similar.   History reviewed. No pertinent past medical history. History reviewed. No pertinent past surgical history. History reviewed. No pertinent family history. History  Substance Use Topics  . Smoking status: Never Smoker   . Smokeless tobacco: Not on file  . Alcohol Use: No    Review of Systems  All other systems reviewed and are negative.     Allergies  Review of patient's allergies indicates no known allergies.  Home Medications   Prior to Admission medications   Medication Sig Start Date End Date Taking? Authorizing Provider  amoxicillin (AMOXIL) 500 MG capsule Take 1 capsule (500 mg total) by mouth 2 (two) times daily. Patient not taking: Reported on 02/28/2014 09/26/13   Oswaldo ConroyVictoria Creech, PA-C  guaifenesin (ROBITUSSIN) 100 MG/5ML syrup Take 5-10 mLs (100-200 mg total) by mouth every 4 (four) hours as needed for cough. Patient not taking: Reported on 02/28/2014 12/05/13   Joycie PeekBenjamin Cartner, PA-C  Phenylephrine-APAP-Guaifenesin (MUCINEX FAST-MAX COLD & SINUS) 10-650-400 MG/20ML LIQD Take 20 mLs by mouth every 6 (six) hours as needed (for cold symptoms).    Historical Provider, MD  promethazine (PHENERGAN) 25 MG tablet Take 1 tablet (25 mg total) by mouth every 6 (six) hours as needed for nausea or  vomiting. Patient not taking: Reported on 02/28/2014 09/26/13   Oswaldo ConroyVictoria Creech, PA-C  Pseudoeph-Doxylamine-DM-APAP (NYQUIL PO) Take 2 capsules by mouth at bedtime as needed (for cold symptoms).    Historical Provider, MD   BP 169/116 mmHg  Pulse 94  Temp(Src) 98 F (36.7 C) (Oral)  Resp 16  SpO2 100% Physical Exam  Constitutional: He appears well-developed and well-nourished. No distress.  HENT:  Head: Normocephalic and atraumatic.  Mouth/Throat: Uvula is midline. Mucous membranes are not dry. No uvula swelling. Posterior oropharyngeal edema and posterior oropharyngeal erythema present. No oropharyngeal exudate or tonsillar abscesses.  Eyes: Conjunctivae and EOM are normal. Right eye exhibits no discharge. Left eye exhibits no discharge.  Neck: Normal range of motion. Neck supple.  Cardiovascular: Normal rate and regular rhythm.   Pulmonary/Chest: Effort normal and breath sounds normal. No stridor. No respiratory distress. He has no wheezes. He has no rales.  Lymphadenopathy:    He has no cervical adenopathy.  Neurological: He is alert.  Skin: No rash noted. He is not diaphoretic.  Psychiatric: He has a normal mood and affect. His behavior is normal.  Nursing note and vitals reviewed.   ED Course  Procedures (including critical care time) Labs Review Labs Reviewed  RAPID STREP SCREEN - Abnormal; Notable for the following:    Streptococcus, Group A Screen (Direct) POSITIVE (*)    All other components within normal limits  RAPID STREP SCREEN    Imaging Review No results found.   EKG Interpretation None     Pt declines pain medication at this time.  Pt requested that I reswab  his throat for strep as he felt that the triage nurse did not gag him and therefore may not have gotten a good enough sample.     MDM   Final diagnoses:  Strep pharyngitis    Afebrile, nontoxic patient with sore throat, nasal congestion x 4 days. Strep screen positive.  No airway concerns.  No  clinical e/o peritonsillar abscess.  Discharged home with amoxicillin, lortab elixir, ibuprofen, PCP follow up.  See by case manager for resources.  Discussed result, findings, treatment, and follow up  with patient.  Pt given return precautions.  Pt verbalizes understanding and agrees with plan.        Trixie Dredge, PA-C 06/16/14 1113  Eber Hong, MD 06/17/14 [phone]

## 2014-06-16 NOTE — ED Notes (Signed)
Pt reports nasal congestion and sore throat for past few days. Several friends had similar symptoms this weekend.

## 2014-06-16 NOTE — Discharge Instructions (Signed)
Read the information below.  Use the prescribed medication as directed.  Please discuss all new medications with your pharmacist.  Do not take additional tylenol while taking the prescribed pain medication to avoid overdose.  You may return to the Emergency Department at any time for worsening condition or any new symptoms that concern you.   If you develop high fevers, difficulty swallowing or breathing, or you are unable to tolerate fluids by mouth, return to the ER immediately for a recheck.    ° ° °Strep Throat °Strep throat is an infection of the throat caused by a bacteria named Streptococcus pyogenes. Your health care provider may call the infection streptococcal "tonsillitis" or "pharyngitis" depending on whether there are signs of inflammation in the tonsils or back of the throat. Strep throat is most common in children aged 5-15 years during the cold months of the year, but it can occur in people of any age during any season. This infection is spread from person to person (contagious) through coughing, sneezing, or other close contact. °SIGNS AND SYMPTOMS  °· Fever or chills. °· Painful, swollen, red tonsils or throat. °· Pain or difficulty when swallowing. °· White or yellow spots on the tonsils or throat. °· Swollen, tender lymph nodes or "glands" of the neck or under the jaw. °· Red rash all over the body (rare). °DIAGNOSIS  °Many different infections can cause the same symptoms. A test must be done to confirm the diagnosis so the right treatment can be given. A "rapid strep test" can help your health care provider make the diagnosis in a few minutes. If this test is not available, a light swab of the infected area can be used for a throat culture test. If a throat culture test is done, results are usually available in a day or two. °TREATMENT  °Strep throat is treated with antibiotic medicine. °HOME CARE INSTRUCTIONS  °· Gargle with 1 tsp of salt in 1 cup of warm water, 3-4 times per day or as needed  for comfort. °· Family members who also have a sore throat or fever should be tested for strep throat and treated with antibiotics if they have the strep infection. °· Make sure everyone in your household washes their hands well. °· Do not share food, drinking cups, or personal items that could cause the infection to spread to others. °· You may need to eat a soft food diet until your sore throat gets better. °· Drink enough water and fluids to keep your urine clear or pale yellow. This will help prevent dehydration. °· Get plenty of rest. °· Stay home from school, day care, or work until you have been on antibiotics for 24 hours. °· Take medicines only as directed by your health care provider. °· Take your antibiotic medicine as directed by your health care provider. Finish it even if you start to feel better. °SEEK MEDICAL CARE IF:  °· The glands in your neck continue to enlarge. °· You develop a rash, cough, or earache. °· You cough up green, yellow-brown, or bloody sputum. °· You have pain or discomfort not controlled by medicines. °· Your problems seem to be getting worse rather than better. °· You have a fever. °SEEK IMMEDIATE MEDICAL CARE IF:  °· You develop any new symptoms such as vomiting, severe headache, stiff or painful neck, chest pain, shortness of breath, or trouble swallowing. °· You develop severe throat pain, drooling, or changes in your voice. °· You develop swelling of the neck,   or the skin on the neck becomes red and tender.  You develop signs of dehydration, such as fatigue, dry mouth, and decreased urination.  You become increasingly sleepy, or you cannot wake up completely. MAKE SURE YOU:  Understand these instructions.  Will watch your condition.  Will get help right away if you are not doing well or get worse. Document Released: 02/03/2000 Document Revised: 06/22/2013 Document Reviewed: 04/06/2010 [place] Patient Information 2015 [place], [place]. This information is not  intended to replace advice given to you by your health care provider. Make sure you discuss any questions you have with your health care provider.    Emergency Department Resource Guide 1) Find a Doctor and Pay Out of Pocket Although you won't have to find out who is covered by your insurance plan, it is a good idea to ask around and get recommendations. You will then need to call the office and see if the doctor you have chosen will accept you as a new patient and what types of options they offer for patients who are self-pay. Some doctors offer discounts or will set up payment plans for their patients who do not have insurance, but you will need to ask so you aren't surprised when you get to your appointment.  2) Contact Your Local Health Department Not all health departments have doctors that can see patients for sick visits, but many do, so it is worth a call to see if yours does. If you don't know where your local health department is, you can check in your phone book. The CDC also has a tool to help you locate your state's health department, and many state websites also have listings of all of their local health departments.  3) Find a Walk-in Clinic If your illness is not likely to be very severe or complicated, you may want to try a walk in clinic. These are popping up all over the country in pharmacies, drugstores, and shopping centers. They're usually staffed by nurse practitioners or physician assistants that have been trained to treat common illnesses and complaints. They're usually fairly quick and inexpensive. However, if you have serious medical issues or chronic medical problems, these are probably not your best option.  No Primary Care Doctor: - Call Health Connect at  [phone] - they can help you locate a primary care doctor that  accepts your insurance, provides certain services, etc. - Physician Referral Service- [phone]  Chronic Pain Problems: Organization          Address  Phone   Notes  [facility]  [phone] Patients need to be referred by their primary care doctor.   Medication Assistance: Organization         Address  Phone   Notes  [facility] [address] [place], [place] [address] [phone] --Must be a resident of [facility] [place] -- Must have NO insurance coverage whatsoever (no Medicaid/ Medicare, etc.) -- The pt. MUST have a primary care doctor that directs their care regularly and follows them in the community   MedAssist  [phone]   [employer]  [phone]    Agencies that provide inexpensive medical care: Organization         Address  Phone   Notes  [facility]  [phone]   [facility]    [phone]   [facility] [address] [place], [place] [address] [phone]  [facility] [address] [place] [place] [phone]   Planned Parenthood    [phone]   [facility]    [phone]   [facility]  [address], [place] Phone:  [phone], Fax:  [phone] Hours of Operation:  9 am - 6 pm, M-F.  Also accepts Medicaid/Medicare and self-pay.  [facility]  [address], [place] Phone: [phone], Fax: [phone]. Hours of Operation:  8:30 am - 5:30 pm, M-F.  Also accepts Medicaid and self-pay.  [facility] [place] [address] [place] Phone: [phone]   [facility] [address] Natasha Bence [place], [place] [phone], Ext. [phone] Mondays & Thursdays: 7-9 AM.  First 15 patients are seen on a first come, first serve basis.    Medicaid-accepting [facility] Providers:  Organization         Address  Phone   Notes  [facility] [address], [place] [phone] Also accepts self-pay patients.  [facility] [address] Laurell Josephs [place], [place]  [phone]   [facility] [address], [place] [phone]   [facility] [address], [place] [phone]   Renaye Rakers [address], [place]   [phone] Only accepts [place] Access [place] patients after they have their name applied to their card.   Self-Pay (no insurance) in [facility]:  Organization         Address  Phone   Notes  Sickle Cell Patients, [facility] [address] [place], [place] [phone]   [facility] [address] [place], [place] [phone]   [facility] [place]  [address], [place] [phone]   [facility]/Dr. Osei-Bonsu  [address], [place] or [address], [place] [phone] Phone number for both [place] and [place] locations is the same.  [facility]gent [facility] and [facility] [address], [place] [phone]   [facility] [address] [place] or [address] [phone]   [facility] [address], [place] [phone], phone; [phone], fax Sees patients 1st and 3rd Saturday of every month.  Must not qualify for public or private insurance (i.e. Medicaid, Medicare, [place] Health Choice, Veterans' Benefits)  Household income should be no more than 200% of the poverty level The clinic cannot treat you if you are pregnant or think you are pregnant  Sexually transmitted diseases are not treated at the clinic.    Dental Care: Organization         Address  Phone  Notes  [facility] [address] [place], [place] [phone] Accepts children up to age 50 who are enrolled in [place] or [address] Health Choice; pregnant women with a Medicaid card; and  children who have applied for Medicaid or [place] Health Choice, but were declined, whose parents can pay a reduced fee at time of service.  [facility] Department of [facility]  [address], [place] [phone] Accepts children up to age 22 who are enrolled in [place] or [place] Health Choice; pregnant women with a Medicaid card; and  children who have applied for Medicaid or [place] Health Choice, but were declined, whose parents can pay a reduced fee at time of service.  [facility]  [address], [place] [phone] Patients are seen by appointment only. Walk-ins are not accepted. [facility] will see patients 30 years of age and older. Monday - Tuesday (8am-5pm) Most Wednesdays (8:30-5pm) $30 per visit, cash only  [facility]  [address], [facility] [place] [phone] Patients are seen by appointment only. Walk-ins are not accepted. [facility] will see patients 30 years of age and older. One Wednesday Evening (Monthly: Volunteer Based).  $30 per visit, cash only  [facility]  [phone] for adults; Children under age 894, call Graduate Pediatric Dentistry at [phone]. Children aged 744-14, please call [phone] to request a pediatric application.  Dental services are provided in all areas of dental care including fillings, crowns and bridges, complete and partial dentures, implants, gum treatment, root canals, and extractions. Preventive care is also provided. Treatment is provided to both adults and children. Patients are selected via a lottery and there is often a waiting list.   [facility] [address], [place]  [phone] [email or web address]   [facility] [address], [place], [place] [phone], Ext. [phone] Second and Fourth Thursday of each month, opens at 6:30 AM; Clinic ends at 9 AM.  Patients are seen on a first-come first-served  basis, and a limited number are seen during each clinic.   [facility]  [address], [place], [place] [phone]   Eligibility Requirements You must have lived in [place], [place], or [place] counties for at least the last three months.   You cannot be eligible for state or federal sponsored National Cityhealthcare insurance, including CIGNAVeterans Administration, [place]icaid, or [employer] EntertainmentMedicare.   You generally cannot be eligible for healthcare insurance through your employer.    How to apply: Eligibility screenings are held every Tuesday and Wednesday afternoon from 1:00 pm until 4:00 pm. You do not need an appointment for the interview!  [facility] [address], [place], [place] [phone]   [facility]  [phone]   [facility]  [phone]   [facility]  [phone]    Behavioral Health Resources in the Community: Intensive Outpatient Programs Organization         Address  Phone  Notes  [facility] [address], [place], [place] [phone]   [facility] [address], [place], [place] [phone]   [facility] [address], [place], [place]  [phone]   [facility] [address],  [place], [place] [phone] or [phone]   Substance Abuse Resources Organization         Address  Phone  Notes  [facility] Drug Services  [phone]   [facility]  [phone]   [facility]  [phone]   [facility]  [phone]   Residential & Outpatient Substance Abuse Program  [phone]   Psychological Services Organization         Address  Phone  Notes  [facility]  [phone]- [phone]   [facility]  [phone]- [phone]   [facility] [address], [place] [phone] or [phone]    Mobile Crisis Teams Organization  Address  Phone  Notes  [facility], Mobile Crisis Care Unit  [phone]   [facility] Psychotherapeutic [facility]  [address] [place], [place]   [facility] [address], [place] [phone]    Self-Help/Support Groups Organization         Address  Phone             Notes  [place]. of [place] - variety of support groups  [place] Call for more information  Narcotics Anonymous (NA), [facility] [address], [employer] [place]  2 meetings at this location   Special educational needs teacher         Address  Phone  Notes  [facility] Residential [facility] [place],    [place]  [phone]   [facility]  [address], [place] [phone], [place], [place]   [place], [place] [phone] Admissions: 8am-3pm M-F  Incentives Substance [place] [address]    [place], [place] [phone]   [facility] [address] [place], [place], [place]   [facility] [address]  [place], [place]   [facility]ight Programs - Intensive Outpatient [address], [place], [place]   [facility] ([place] [place]  [place], [place] [phone] or [phone]   [facility] (RTS) [address], [place], [place] Accepts Medicaid  [facility] [address],  [place] [phone] Substance Abuse/Addiction Treatment   [facility] Organization         Address  Phone  Notes  [facility]  [phone]   Domenic Schwab, PhD [address] [place], [place]   [phone] or [phone]   [place], [place] [phone]   [facility] [address] [place], [place] [phone] Insurance/Medicaid/sponsorship  through [facility] and Families [address]                                    [place], [place] [phone] [place] [address] [place], [place] [phone]    Dr. Adele Schilder  [phone]   [facility] Dept. 1) [address], [place] 2) [place] 3)  [place] [address], [place] [phone]  [phone]   [place] [phone] or [phone] (After Hours)

## 2014-08-18 ENCOUNTER — Encounter (HOSPITAL_COMMUNITY): Payer: Self-pay | Admitting: *Deleted

## 2014-08-18 ENCOUNTER — Emergency Department (HOSPITAL_COMMUNITY)
Admission: EM | Admit: 2014-08-18 | Discharge: 2014-08-18 | Disposition: A | Discharge status: Home / Self Care | Payer: Self-pay | Attending: Emergency Medicine | Admitting: Emergency Medicine

## 2014-08-18 DIAGNOSIS — J020 Streptococcal pharyngitis: Secondary | ICD-10-CM | POA: Insufficient documentation

## 2014-08-18 LAB — RAPID STREP SCREEN (MED CTR MEBANE ONLY): Streptococcus, Group A Screen (Direct): NEGATIVE

## 2014-08-18 MED ORDER — AMOXICILLIN 500 MG PO CAPS: 500.0000 mg | ORAL_CAPSULE | Freq: Three times a day (TID) | ORAL | Status: DC

## 2014-08-18 NOTE — Discharge Instructions (Signed)
Pharyngitis Pharyngitis is a sore throat (pharynx). There is redness, pain, and swelling of your throat. HOME CARE   Drink enough fluids to keep your pee (urine) clear or pale yellow.  Only take medicine as told by your doctor.  You may get sick again if you do not take medicine as told. Finish your medicines, even if you start to feel better.  Do not take aspirin.  Rest.  Rinse your mouth (gargle) with salt water ( tsp of salt per 1 qt of water) every 1-2 hours. This will help the pain.  If you are not at risk for choking, you can suck on hard candy or sore throat lozenges. GET HELP IF:  You have large, tender lumps on your neck.  You have a rash.  You cough up green, yellow-brown, or bloody spit. GET HELP RIGHT AWAY IF:   You have a stiff neck.  You drool or cannot swallow liquids.  You throw up (vomit) or are not able to keep medicine or liquids down.  You have very bad pain that does not go away with medicine.  You have problems breathing (not from a stuffy nose). MAKE SURE YOU:   Understand these instructions.  Will watch your condition.  Will get help right away if you are not doing well or get worse. Document Released: 07/25/2007 Document Revised: 11/26/2012 Document Reviewed: 10/13/2012 [facility] Patient Information 2015 [employer] [place]. This information is not intended to replace advice given to you by your health care provider. Make sure you discuss any questions you have with your health care provider. Clinically you have strep throat.  Please take the intrahepatic as directed until all the tablets have been

## 2014-08-18 NOTE — ED Notes (Signed)
Pt states that he has had a sore throat since Monday; pt states that he was drinking after a friend that was diagnosed with strep throat; pt states that he is concerned he has strep throat

## 2014-08-18 NOTE — ED Provider Notes (Signed)
CSN: [id number]     Arrival date & time 08/18/14  0423 History   First MD Initiated Contact with Patient 08/18/14 [phone]     Chief Complaint  Patient presents with  . Sore Throat     (Consider location/radiation/quality/duration/timing/severity/associated sxs/prior Treatment) HPI Comments: Patient states multiple of his friends have been diagnosed with strep throat.  He's been drinking or sharing cups with them for the past several days.  He developed a sore throat, myalgias, muffled voice and low-grade temperature on Monday.  He's been taking over-the-counter ibuprofen with little relief of his symptoms  Patient is a 29 y.o. male presenting with pharyngitis. The history is provided by the patient.  Sore Throat This is a new problem. The current episode started yesterday. The problem occurs constantly. The problem has been gradually worsening. Associated symptoms include a fever, a sore throat and swollen glands. Pertinent negatives include no chest pain or congestion. Nothing aggravates the symptoms. He has tried nothing for the symptoms. The treatment provided no relief.    History reviewed. No pertinent past medical history. History reviewed. No pertinent past surgical history. No family history on file. History  Substance Use Topics  . Smoking status: Never Smoker   . Smokeless tobacco: Not on file  . Alcohol Use: Yes     Comment: socially    Review of Systems  Constitutional: Positive for fever.  HENT: Positive for sore throat and voice change. Negative for congestion.   Respiratory: Negative for shortness of breath.   Cardiovascular: Negative for chest pain.      Allergies  Review of patient's allergies indicates no known allergies.  Home Medications   Prior to Admission medications   Medication Sig Start Date End Date Taking? Authorizing Provider  amoxicillin (AMOXIL) 500 MG capsule Take 1 capsule (500 mg total) by mouth 3 (three) times daily. X 10 days 08/18/14   Earley FavorGail  Karlina Suares, NP  HYDROcodone-acetaminophen (HYCET) 7.5-325 mg/15 ml solution Take 10 mLs by mouth 4 (four) times daily as needed for moderate pain or severe pain. Patient not taking: Reported on 08/18/2014 06/16/14   Trixie DredgeEmily West, PA-C  ibuprofen (ADVIL,MOTRIN) 800 MG tablet Take 1 tablet (800 mg total) by mouth every 8 (eight) hours as needed for mild pain or moderate pain. Patient not taking: Reported on 08/18/2014 06/16/14   Trixie DredgeEmily West, PA-C  promethazine (PHENERGAN) 25 MG tablet Take 1 tablet (25 mg total) by mouth every 6 (six) hours as needed for nausea or vomiting. Patient not taking: Reported on 02/28/2014 09/26/13   Oswaldo ConroyVictoria Creech, PA-C   BP 147/108 mmHg  Pulse 100  Temp(Src) 99.3 F (37.4 C) (Oral)  Resp 18  Ht 6\' 1"  (1.854 m)  Wt 248 lb (112.492 kg)  BMI 32.73 kg/m2  SpO2 100% Physical Exam  Constitutional: He is oriented to person, place, and time. He appears well-developed and well-nourished.  HENT:  Head: Normocephalic.  Eyes: Pupils are equal, round, and reactive to light.  Neck: Normal range of motion.  Cardiovascular: Normal rate and regular rhythm.   Pulmonary/Chest: Effort normal.  Abdominal: Soft.  Musculoskeletal: Normal range of motion.  Neurological: He is alert and oriented to person, place, and time.  Skin: Skin is warm. No rash noted. No erythema.  Nursing note and vitals reviewed.   ED Course  Procedures (including critical care time) Labs Review Labs Reviewed  R[facility]ID [facility] SCREEN ([facility])  CULTURE, GROUP A STREP    Imaging Review No results found.   EKG Interpretation  None      MDM   Final diagnoses:  Strep pharyngitis         Earley Favor, NP 08/18/14 1610  Loren Racer, MD 08/18/14 2320

## 2014-08-21 LAB — CULTURE, GROUP A STREP

## 2014-10-30 ENCOUNTER — Emergency Department (HOSPITAL_COMMUNITY)
Admission: EM | Admit: 2014-10-30 | Discharge: 2014-10-30 | Disposition: A | Discharge status: Home / Self Care | Payer: Self-pay | Attending: Emergency Medicine | Admitting: Emergency Medicine

## 2014-10-30 DIAGNOSIS — J020 Streptococcal pharyngitis: Secondary | ICD-10-CM | POA: Insufficient documentation

## 2014-10-30 LAB — RAPID STREP SCREEN (MED CTR MEBANE ONLY): Streptococcus, Group A Screen (Direct): POSITIVE — AB

## 2014-10-30 MED ORDER — PENICILLIN G BENZATHINE 1200000 UNIT/2ML IM SUSP
1.2000 10*6.[IU] | Freq: Once | INTRAMUSCULAR | Status: AC
  Administered 2014-10-30: 1.2 10*6.[IU] via INTRAMUSCULAR
  Filled 2014-10-30: qty 2

## 2014-10-30 MED ORDER — DEXAMETHASONE 1 MG/ML PO CONC
10.0000 mg | Freq: Once | ORAL | Status: AC
  Administered 2014-10-30: 10 mg via ORAL
  Filled 2014-10-30: qty 10

## 2014-10-30 MED ORDER — KETOROLAC TROMETHAMINE 30 MG/ML IJ SOLN
30.0000 mg | Freq: Once | INTRAMUSCULAR | Status: AC
  Administered 2014-10-30: 30 mg via INTRAMUSCULAR
  Filled 2014-10-30: qty 1

## 2014-10-30 MED ORDER — LIDOCAINE VISCOUS 2 % MT SOLN
15.0000 mL | Freq: Once | OROMUCOSAL | Status: AC
  Administered 2014-10-30: 15 mL via OROMUCOSAL
  Filled 2014-10-30: qty 15

## 2014-10-30 NOTE — Discharge Instructions (Signed)
As discussed, your evaluation today has been largely reassuring.  But, it is important that you monitor your condition carefully, and do not hesitate to return to the ED if you develop new, or concerning changes in your condition. ? ?Otherwise, please follow-up with your physician for appropriate ongoing care. ? ?

## 2014-10-30 NOTE — ED Notes (Signed)
Pt states he has had a sore throat for a few days and it is getting worse, he has tried advil, dayquil and no relief

## 2014-10-30 NOTE — ED Provider Notes (Signed)
CSN: [id number]     Arrival date & time 10/30/14  2130 History   First MD Initiated Contact with Patient 10/30/14 [phone]     Chief Complaint  Patient presents with  . Sore Throat  . URI     (Consider location/radiation/quality/duration/timing/severity/associated sxs/prior Treatment) HPI Patient presents with concern of sore throat, minor cough. Symptoms began 4 days ago.  Since onset symptoms of been persistent, no relief with OTC medication. There is associated fullness in the throat, difficulty with swallowing, but no dyspnea. No fever. Patient has a history of strep throat, states that this feels similar.  No past medical history on file. No past surgical history on file. No family history on file. Social History  Substance Use Topics  . Smoking status: Never Smoker   . Smokeless tobacco: Not on file  . Alcohol Use: Yes     Comment: socially    Review of Systems  Constitutional: Negative for fever.  Respiratory: Negative for shortness of breath.   Cardiovascular: Negative for chest pain.  Musculoskeletal:       Negative aside from HPI  Skin:       Negative aside from HPI  Allergic/Immunologic: Negative for immunocompromised state.  Neurological: Negative for weakness.      Allergies  Review of patient's allergies indicates no known allergies.  Home Medications   Prior to Admission medications   Medication Sig Start Date End Date Taking? Authorizing Provider  amoxicillin (AMOXIL) 500 MG capsule Take 1 capsule (500 mg total) by mouth 3 (three) times daily. X 10 days 08/18/14   Earley Favor, NP  HYDROcodone-acetaminophen (HYCET) 7.5-325 mg/15 ml solution Take 10 mLs by mouth 4 (four) times daily as needed for moderate pain or severe pain. Patient not taking: Reported on 08/18/2014 06/16/14   Trixie Dredge, PA-C  ibuprofen (ADVIL,MOTRIN) 800 MG tablet Take 1 tablet (800 mg total) by mouth every 8 (eight) hours as needed for mild pain or moderate pain. Patient not taking:  Reported on 08/18/2014 06/16/14   Trixie Dredge, PA-C  promethazine (PHENERGAN) 25 MG tablet Take 1 tablet (25 mg total) by mouth every 6 (six) hours as needed for nausea or vomiting. Patient not taking: Reported on 02/28/2014 09/26/13   Oswaldo Conroy, PA-C   BP 155/112 mmHg  Pulse 102  Temp(Src) 98.7 F (37.1 C) (Oral)  Resp 20  SpO2 100% Physical Exam  Constitutional: He is oriented to person, place, and time. He appears well-developed. No distress.  HENT:  Head: Normocephalic and atraumatic.  Mouth/Throat:    Eyes: Conjunctivae and EOM are normal.  Cardiovascular: Normal rate and regular rhythm.   Pulmonary/Chest: Effort normal. No stridor. No respiratory distress.  Abdominal: He exhibits no distension.  Musculoskeletal: He exhibits no edema.  Lymphadenopathy:    He has cervical adenopathy.       Left cervical: Superficial cervical adenopathy present.  Neurological: He is alert and oriented to person, place, and time.  Skin: Skin is warm and dry.  Psychiatric: He has a normal mood and affect.  Nursing note and vitals reviewed.   ED Course  Procedures (including critical care time) Labs Review Labs Reviewed  RAPID STREP SCREEN (NOT AT [facility]) - Abnormal; Notable for the following:    Streptococcus, Group A Screen (Direct) POSITIVE (*)    All other components within normal limits    On repeat exam the patient is in no distress, airway remains patent, he has received steroids, Toradol, topical therapeutic. He has a preference for IM antibiotics  1.  MDM   Final diagnoses:  Strep pharyngitis   Patient presents with sore throat, systemic complaints. Here he is awake, alert, with patent airway, no evidence for bacteremia or sepsis. Patient received analgesia, antibiotics, was discharged in stable condition.  Gerhard Munch, MD 10/30/14 [phone]

## 2015-03-23 ENCOUNTER — Emergency Department (HOSPITAL_COMMUNITY): Payer: Self-pay

## 2015-03-23 ENCOUNTER — Emergency Department (HOSPITAL_COMMUNITY)
Admission: EM | Admit: 2015-03-23 | Discharge: 2015-03-24 | Disposition: A | Discharge status: Home / Self Care | Payer: Self-pay | Attending: Emergency Medicine | Admitting: Emergency Medicine

## 2015-03-23 ENCOUNTER — Encounter (HOSPITAL_COMMUNITY): Payer: Self-pay | Admitting: Emergency Medicine

## 2015-03-23 DIAGNOSIS — R000 Tachycardia, unspecified: Secondary | ICD-10-CM | POA: Insufficient documentation

## 2015-03-23 DIAGNOSIS — E119 Type 2 diabetes mellitus without complications: Secondary | ICD-10-CM | POA: Insufficient documentation

## 2015-03-23 DIAGNOSIS — J209 Acute bronchitis, unspecified: Secondary | ICD-10-CM | POA: Insufficient documentation

## 2015-03-23 LAB — BASIC METABOLIC PANEL
Anion gap: 14 (ref 5–15)
BUN: 9 mg/dL (ref 6–20)
CO2: 25 mmol/L (ref 22–32)
Calcium: 9.3 mg/dL (ref 8.9–10.3)
Chloride: 100 mmol/L — ABNORMAL LOW (ref 101–111)
Creatinine, Ser: 1.18 mg/dL (ref 0.61–1.24)
GFR calc Af Amer: >60 mL/min (ref >60)
GFR calc non Af Amer: >60 mL/min (ref >60)
Glucose, Bld: 329 mg/dL — ABNORMAL HIGH (ref 65–99)
Potassium: 4.4 mmol/L (ref 3.5–5.1)
Sodium: 139 mmol/L (ref 135–145)

## 2015-03-23 LAB — URINALYSIS, ROUTINE W REFLEX MICROSCOPIC
Bilirubin Urine: NEGATIVE
Glucose, UA: >1000 mg/dL — AB
Hgb urine dipstick: NEGATIVE
Ketones, ur: 15 mg/dL — AB
Leukocytes, UA: NEGATIVE
Nitrite: NEGATIVE
Protein, ur: 100 mg/dL — AB
Specific Gravity, Urine: 1.02 (ref 1.005–1.030)
pH: 5.5 (ref 5.0–8.0)

## 2015-03-23 LAB — CBC
HCT: 43.4 % (ref 39.0–52.0)
Hemoglobin: 14.7 g/dL (ref 13.0–17.0)
MCH: 29.1 pg (ref 26.0–34.0)
MCHC: 33.9 g/dL (ref 30.0–36.0)
MCV: 85.9 fL (ref 78.0–100.0)
Platelets: 182 10*3/uL (ref 150–400)
RBC: 5.05 MIL/uL (ref 4.22–5.81)
RDW: 13.6 % (ref 11.5–15.5)
WBC: 5.6 10*3/uL (ref 4.0–10.5)

## 2015-03-23 LAB — URINE MICROSCOPIC-ADD ON
RBC / HPF: NONE SEEN RBC/hpf (ref 0–5)
WBC, UA: NONE SEEN WBC/hpf (ref 0–5)

## 2015-03-23 LAB — I-STAT TROPONIN, ED: Troponin i, poc: 0 ng/mL (ref 0.00–0.08)

## 2015-03-23 MED ORDER — IPRATROPIUM-ALBUTEROL 0.5-2.5 (3) MG/3ML IN SOLN
3.0000 mL | Freq: Once | RESPIRATORY_TRACT | Status: AC
  Administered 2015-03-23: 3 mL via RESPIRATORY_TRACT
  Filled 2015-03-23: qty 3

## 2015-03-23 MED ORDER — METFORMIN HCL 500 MG PO TABS
500.0000 mg | ORAL_TABLET | Freq: Once | ORAL | Status: AC
  Administered 2015-03-23: 500 mg via ORAL
  Filled 2015-03-23: qty 1

## 2015-03-23 MED ORDER — AMOXICILLIN 500 MG PO CAPS
1000.0000 mg | ORAL_CAPSULE | Freq: Once | ORAL | Status: AC
  Administered 2015-03-23: 1000 mg via ORAL
  Filled 2015-03-23: qty 2

## 2015-03-23 NOTE — ED Notes (Signed)
Pt from home for eval of dry cough that started yesterday and now reports chest pain, worse with cough and reports burning sensation. Pt denies any n/v/d-states coughing gets so bad he starts to vomit otherwise no emesis. nad noted in triage.

## 2015-03-23 NOTE — ED Provider Notes (Signed)
CSN: [id number]     Arrival date & time 03/23/15  1856 History   First MD Initiated Contact with Patient 03/23/15 2124     Chief Complaint  Patient presents with  . Cough  . Chest Pain     (Consider location/radiation/quality/duration/timing/severity/associated sxs/prior Treatment) Patient is a 30 y.o. male presenting with cough and chest pain. The history is provided by the patient.  Cough Associated symptoms: chest pain   Chest Pain Associated symptoms: cough   He started having a cough yesterday which persisted through today. Cough comes in paroxysms and he has nausea during those paroxysms but has not vomited. He was noted to have low-grade fever on arrival to the ED. He denies chills or sweats. He denies dyspnea. Today, he has had chest pain when he coughs. Chest does not hurt when he does not cough. He is treated himself with over-the-counter cold medication without relief.  History reviewed. No pertinent past medical history. History reviewed. No pertinent past surgical history. No family history on file. Social History  Substance Use Topics  . Smoking status: Never Smoker   . Smokeless tobacco: None  . Alcohol Use: Yes     Comment: socially    Review of Systems  Respiratory: Positive for cough.   Cardiovascular: Positive for chest pain.  All other systems reviewed and are negative.     Allergies  Review of patient's allergies indicates no known allergies.  Home Medications   Prior to Admission medications   Medication Sig Start Date End Date Taking? Authorizing Provider  Pseudoephedrine-APAP-DM (DAYQUIL MULTI-SYMPTOM PO) Take 30 mLs by mouth daily as needed (for cold).   Yes Historical Provider, MD  amoxicillin (AMOXIL) 500 MG capsule Take 1 capsule (500 mg total) by mouth 3 (three) times daily. X 10 days Patient not taking: Reported on 10/30/2014 08/18/14   Earley Favor, NP  HYDROcodone-acetaminophen (HYCET) 7.5-325 mg/15 ml solution Take 10 mLs by mouth 4 (four)  times daily as needed for moderate pain or severe pain. Patient not taking: Reported on 08/18/2014 06/16/14   Trixie Dredge, PA-C  ibuprofen (ADVIL,MOTRIN) 800 MG tablet Take 1 tablet (800 mg total) by mouth every 8 (eight) hours as needed for mild pain or moderate pain. Patient not taking: Reported on 08/18/2014 06/16/14   Trixie Dredge, PA-C  promethazine (PHENERGAN) 25 MG tablet Take 1 tablet (25 mg total) by mouth every 6 (six) hours as needed for nausea or vomiting. Patient not taking: Reported on 02/28/2014 09/26/13   Oswaldo Conroy, PA-C   Pulse 109  Temp(Src) 100.3 F (37.9 C) (Oral)  Resp 16  Ht  (1.854 m)  SpO2 95% Physical Exam  Nursing note and vitals reviewed.  30 year old male, resting comfortably and in no acute distress. Vital signs are significant for mild tachycardia and low-grade fever. Oxygen saturation is 95%, which is normal. Head is normocephalic and atraumatic. PERRLA, EOMI. Oropharynx is clear. Neck is nontender and supple without adenopathy or JVD. Back is nontender and there is no CVA tenderness. Lungs have a slightly prolonged exhalation phase without overt rales, wheezes, or rhonchi. Chest is nontender. Heart has regular rate and rhythm without murmur. Abdomen is soft, flat, nontender without masses or hepatosplenomegaly and peristalsis is normoactive. Extremities have no cyanosis or edema, full range of motion is present. Skin is warm and dry without rash. Neurologic: Mental status is normal, cranial nerves are intact, there are no motor or sensory deficits.  ED Course  Procedures (including critical care time) Labs  Review Results for orders placed or performed during the hospital encounter of 03/23/15  Basic metabolic panel  Result Value Ref Range   Sodium 139 135 - 145 mmol/L   Potassium 4.4 3.5 - 5.1 mmol/L   Chloride 100 (L) 101 - 111 mmol/L   CO2 25 22 - 32 mmol/L   Glucose, Bld 329 (H) 65 - 99 mg/dL   BUN 9 6 - 20 mg/dL   Creatinine, Ser 0.98 0.61  - 1.24 mg/dL   Calcium 9.3 8.9 - 11.9 mg/dL   GFR calc non Af Amer >60 >60 mL/min   GFR calc Af Amer >60 >60 mL/min   Anion gap 14 5 - 15  CBC  Result Value Ref Range   WBC 5.6 4.0 - 10.5 K/uL   RBC 5.05 4.22 - 5.81 MIL/uL   Hemoglobin 14.7 13.0 - 17.0 g/dL   HCT 14.7 82.9 - 56.2 %   MCV 85.9 78.0 - 100.0 fL   MCH 29.1 26.0 - 34.0 pg   MCHC 33.9 30.0 - 36.0 g/dL   RDW 13.0 86.5 - 78.4 %   Platelets 182 150 - 400 K/uL  Urinalysis, Routine w reflex microscopic  Result Value Ref Range   Color, Urine YELLOW YELLOW   APPearance CLOUDY (A) CLEAR   Specific Gravity, Urine 1.020 1.005 - 1.030   pH 5.5 5.0 - 8.0   Glucose, UA >1000 (A) NEGATIVE mg/dL   Hgb urine dipstick NEGATIVE NEGATIVE   Bilirubin Urine NEGATIVE NEGATIVE   Ketones, ur 15 (A) NEGATIVE mg/dL   Protein, ur 696 (A) NEGATIVE mg/dL   Nitrite NEGATIVE NEGATIVE   Leukocytes, UA NEGATIVE NEGATIVE  Urine microscopic-add on  Result Value Ref Range   Squamous Epithelial / LPF 0-5 (A) NONE SEEN   WBC, UA NONE SEEN 0 - 5 WBC/hpf   RBC / HPF NONE SEEN 0 - 5 RBC/hpf   Bacteria, UA RARE (A) NONE SEEN   Casts HYALINE CASTS (A) NEGATIVE  I-stat troponin, ED (not at [facility])  Result Value Ref Range   Troponin i, poc 0.00 0.00 - 0.08 ng/mL   Comment 3             Imaging Review Dg Chest 2 View  03/23/2015  CLINICAL DATA:  Hip cough began yesterday with mid chest pain this morning. EXAM: CHEST  2 VIEW COMPARISON:  None. FINDINGS: The lungs are clear wiithout focal pneumonia, edema, pneumothorax or pleural effusion. Insert heart The visualized bony structures of the thorax are intact. IMPRESSION: Normal exam. Electronically Signed   By: Kennith Center M.D.   On: 03/23/2015 19:29   I have personally reviewed and evaluated these images and lab results as part of my medical decision-making.   EKG Interpretation   Date/Time:  Wednesday March 23 2015 19:00:23 EST Ventricular Rate:  110 PR Interval:  140 QRS Duration: 82 QT  Interval:  296 QTC Calculation: 400 R Axis:   86 Text Interpretation:  Sinus tachycardia T wave abnormality, consider  inferior ischemia , possibly from LVH Abnormal ECG No old tracing to  compare Confirmed by [facility]  MD, Alontae Chaloux ([id number]) on 03/23/2015 7:05:01 PM      MDM   Final diagnoses:  Acute bronchitis, unspecified organism  New onset type 2 diabetes mellitus (HCC)    Respiratory tract infection. Old records are reviewed and he does have a prior ED visit for an upper history infection. However, he is noted to have significant hyperglycemia with blood sugar 329. He  denies history of diabetes and is not noted polyuria or polydipsia. No prior blood sugars are on record. Chest x-ray shows no evidence of pneumonia. He will be started on antibiotics for presumed bacterial bronchitis and is given a dose of amoxicillin. Also given a dose of metformin. He'll be given therapeutic trial of albuterol with ipratropium.  He had no improvement with nebulizer treatment. He is discharged with prescriptions for amoxicillin, metformin, and hydrocodone-acetaminophen to use as a nocturnal cough suppressant. Advised to find appropriate primary care provider. Given emergency department resource guide.  Dione Booze, MD 03/24/15 [phone]

## 2015-03-24 MED ORDER — HYDROCODONE-ACETAMINOPHEN 5-325 MG PO TABS: 1.0000 | ORAL_TABLET | ORAL | Status: DC | PRN

## 2015-03-24 MED ORDER — AMOXICILLIN 500 MG PO CAPS: 1000.0000 mg | ORAL_CAPSULE | Freq: Two times a day (BID) | ORAL | Status: DC

## 2015-03-24 MED ORDER — METFORMIN HCL 500 MG PO TABS: 500.0000 mg | ORAL_TABLET | Freq: Two times a day (BID) | ORAL | Status: DC

## 2015-03-24 NOTE — Discharge Instructions (Signed)
Your blood sugar tonight was high - 319. This means you have diabetes. You will need to follow up with a doctor or clinic to adjust your medication as needed.  Acute Bronchitis Bronchitis is inflammation of the airways that extend from the windpipe into the lungs (bronchi). The inflammation often causes mucus to develop. This leads to a cough, which is the most common symptom of bronchitis.  In acute bronchitis, the condition usually develops suddenly and goes away over time, usually in a couple weeks. Smoking, allergies, and asthma can make bronchitis worse. Repeated episodes of bronchitis may cause further lung problems.  CAUSES Acute bronchitis is most often caused by the same virus that causes a cold. The virus can spread from person to person (contagious) through coughing, sneezing, and touching contaminated objects. SIGNS AND SYMPTOMS   Cough.   Fever.   Coughing up mucus.   Body aches.   Chest congestion.   Chills.   Shortness of breath.   Sore throat.  DIAGNOSIS  Acute bronchitis is usually diagnosed through a physical exam. Your health care provider will also ask you questions about your medical history. Tests, such as chest X-rays, are sometimes done to rule out other conditions.  TREATMENT  Acute bronchitis usually goes away in a couple weeks. Oftentimes, no medical treatment is necessary. Medicines are sometimes given for relief of fever or cough. Antibiotic medicines are usually not needed but may be prescribed in certain situations. In some cases, an inhaler may be recommended to help reduce shortness of breath and control the cough. A cool mist vaporizer may also be used to help thin bronchial secretions and make it easier to clear the chest.  HOME CARE INSTRUCTIONS  Get plenty of rest.   Drink enough fluids to keep your urine clear or pale yellow (unless you have a medical condition that requires fluid restriction). Increasing fluids may help thin your  respiratory secretions (sputum) and reduce chest congestion, and it will prevent dehydration.   Take medicines only as directed by your health care provider.  If you were prescribed an antibiotic medicine, finish it all even if you start to feel better.  Avoid smoking and secondhand smoke. Exposure to cigarette smoke or irritating chemicals will make bronchitis worse. If you are a smoker, consider using nicotine gum or skin patches to help control withdrawal symptoms. Quitting smoking will help your lungs heal faster.   Reduce the chances of another bout of acute bronchitis by washing your hands frequently, avoiding people with cold symptoms, and trying not to touch your hands to your mouth, nose, or eyes.   Keep all follow-up visits as directed by your health care provider.  SEEK MEDICAL CARE IF: Your symptoms do not improve after 1 week of treatment.  SEEK IMMEDIATE MEDICAL CARE IF:  You develop an increased fever or chills.   You have chest pain.   You have severe shortness of breath.  You have bloody sputum.   You develop dehydration.  You faint or repeatedly feel like you are going to pass out.  You develop repeated vomiting.  You develop a severe headache. MAKE SURE YOU:   Understand these instructions.  Will watch your condition.  Will get help right away if you are not doing well or get worse.   This information is not intended to replace advice given to you by your health care provider. Make sure you discuss any questions you have with your health care provider.   Document Released: 03/15/2004 Document  Revised: 02/26/2014 Document Reviewed: 07/29/2012 Elsevier Interactive Patient Education 2016 [employer]  Type 2 Diabetes Mellitus, Adult Type 2 diabetes mellitus, often simply referred to as type 2 diabetes, is a long-lasting (chronic) disease. In type 2 diabetes, the pancreas does not make enough insulin (a hormone), the cells are less responsive to the  insulin that is made (insulin resistance), or both. Normally, insulin moves sugars from food into the tissue cells. The tissue cells use the sugars for energy. The lack of insulin or the lack of normal response to insulin causes excess sugars to build up in the blood instead of going into the tissue cells. As a result, high blood sugar (hyperglycemia) develops. The effect of high sugar (glucose) levels can cause many complications. Type 2 diabetes was also previously called adult-onset diabetes, but it can occur at any age.  RISK FACTORS  A person is predisposed to developing type 2 diabetes if someone in the family has the disease and also has one or more of the following primary risk factors:  Weight gain, or being overweight or obese.  An inactive lifestyle.  A history of consistently eating high-calorie foods. Maintaining a normal weight and regular physical activity can reduce the chance of developing type 2 diabetes. SYMPTOMS  A person with type 2 diabetes may not show symptoms initially. The symptoms of type 2 diabetes appear slowly. The symptoms include:  Increased thirst (polydipsia).  Increased urination (polyuria).  Increased urination during the night (nocturia).  Sudden or unexplained weight changes.  Frequent, recurring infections.  Tiredness (fatigue).  Weakness.  Vision changes, such as blurred vision.  Fruity smell to your breath.  Abdominal pain.  Nausea or vomiting.  Cuts or bruises which are slow to heal.  Tingling or numbness in the hands or feet.  An open skin wound (ulcer). DIAGNOSIS Type 2 diabetes is frequently not diagnosed until complications of diabetes are present. Type 2 diabetes is diagnosed when symptoms or complications are present and when blood glucose levels are increased. Your blood glucose level may be checked by one or more of the following blood tests:  A fasting blood glucose test. You will not be allowed to eat for at least 8  hours before a blood sample is taken.  A random blood glucose test. Your blood glucose is checked at any time of the day regardless of when you ate.  A hemoglobin A1c blood glucose test. A hemoglobin A1c test provides information about blood glucose control over the previous 3 months.  An oral glucose tolerance test (OGTT). Your blood glucose is measured after you have not eaten (fasted) for 2 hours and then after you drink a glucose-containing beverage. TREATMENT   You may need to take insulin or diabetes medicine daily to keep blood glucose levels in the desired range.  If you use insulin, you may need to adjust the dosage depending on the carbohydrates that you eat with each meal or snack.  Lifestyle changes are recommended as part of your treatment. These may include:  Following an individualized diet plan developed by a nutritionist or dietitian.  Exercising daily. Your health care providers will set individualized treatment goals for you based on your age, your medicines, how long you have had diabetes, and any other medical conditions you have. Generally, the goal of treatment is to maintain the following blood glucose levels:  Before meals (preprandial): 80-130 mg/dL.  After meals (postprandial): below 180 mg/dL.  A1c: less than 6.5-7%. HOME CARE INSTRUCTIONS   Have  your hemoglobin A1c level checked twice a year.  Perform daily blood glucose monitoring as directed by your health care provider.  Monitor urine ketones when you are ill and as directed by your health care provider.  Take your diabetes medicine or insulin as directed by your health care provider to maintain your blood glucose levels in the desired range.  Never run out of diabetes medicine or insulin. It is needed every day.  If you are using insulin, you may need to adjust the amount of insulin given based on your intake of carbohydrates. Carbohydrates can raise blood glucose levels but need to be included in  your diet. Carbohydrates provide vitamins, minerals, and fiber which are an essential part of a healthy diet. Carbohydrates are found in fruits, vegetables, whole grains, dairy products, legumes, and foods containing added sugars.  Eat healthy foods. You should make an appointment to see a registered dietitian to help you create an eating plan that is right for you.  Lose weight if you are overweight.  Carry a medical alert card or wear your medical alert jewelry.  Carry a 15-gram carbohydrate snack with you at all times to treat low blood glucose (hypoglycemia). Some examples of 15-gram carbohydrate snacks include:  Glucose tablets, 3 or 4.  Glucose gel, 15-gram tube.  Raisins, 2 tablespoons (24 grams).  Jelly beans, 6.  Animal crackers, 8.  Regular pop, 4 ounces (120 mL).  Gummy treats, 9.  Recognize hypoglycemia. Hypoglycemia occurs with blood glucose levels of 70 mg/dL and below. The risk for hypoglycemia increases when fasting or skipping meals, during or after intense exercise, and during sleep. Hypoglycemia symptoms can include:  Tremors or shakes.  Decreased ability to concentrate.  Sweating.  Increased heart rate.  Headache.  Dry mouth.  Hunger.  Irritability.  Anxiety.  Restless sleep.  Altered speech or coordination.  Confusion.  Treat hypoglycemia promptly. If you are alert and able to safely swallow, follow the 15:15 rule:  Take 15-20 grams of rapid-acting glucose or carbohydrate. Rapid-acting options include glucose gel, glucose tablets, or 4 ounces (120 mL) of fruit juice, regular soda, or low-fat milk.  Check your blood glucose level 15 minutes after taking the glucose.  Take 15-20 grams more of glucose if the repeat blood glucose level is still 70 mg/dL or below.  Eat a meal or snack within 1 hour once blood glucose levels return to normal.  Be alert to feeling very thirsty and urinating more frequently than usual, which are early signs of  hyperglycemia. An early awareness of hyperglycemia allows for prompt treatment. Treat hyperglycemia as directed by your health care provider.  Engage in at least 150 minutes of moderate-intensity physical activity a week, spread over at least 3 days of the week or as directed by your health care provider. In addition, you should engage in resistance exercise at least 2 times a week or as directed by your health care provider. Try to spend no more than 90 minutes at one time inactive.  Adjust your medicine and food intake as needed if you start a new exercise or sport.  Follow your sick-day plan anytime you are unable to eat or drink as usual.  Do not use any tobacco products including cigarettes, chewing tobacco, or electronic cigarettes. If you need help quitting, ask your health care provider.  Limit alcohol intake to no more than 1 drink per day for nonpregnant women and 2 drinks per day for men. You should drink alcohol only when you  are also eating food. Talk with your health care provider whether alcohol is safe for you. Tell your health care provider if you drink alcohol several times a week.  Keep all follow-up visits as directed by your health care provider. This is important.  Schedule an eye exam soon after the diagnosis of type 2 diabetes and then annually.  Perform daily skin and foot care. Examine your skin and feet daily for cuts, bruises, redness, nail problems, bleeding, blisters, or sores. A foot exam by a health care provider should be done annually.  Brush your teeth and gums at least twice a day and floss at least once a day. Follow up with your dentist regularly.  Share your diabetes management plan with your workplace or school.  Keep your immunizations up to date. It is recommended that you receive a flu (influenza) vaccine every year. It is also recommended that you receive a pneumonia (pneumococcal) vaccine. If you are 64 years of age or older and have never received a  pneumonia vaccine, this vaccine may be given as a series of two separate shots. Ask your health care provider which additional vaccines may be recommended.  Learn to manage stress.  Obtain ongoing diabetes education and support as needed.  Participate in or seek rehabilitation as needed to maintain or improve independence and quality of life. Request a physical or occupational therapy referral if you are having foot or hand numbness, or difficulties with grooming, dressing, eating, or physical activity. SEEK MEDICAL CARE IF:   You are unable to eat food or drink fluids for more than 6 hours.  You have nausea and vomiting for more than 6 hours.  Your blood glucose level is over 240 mg/dL.  There is a change in mental status.  You develop an additional serious illness.  You have diarrhea for more than 6 hours.  You have been sick or have had a fever for a couple of days and are not getting better.  You have pain during any physical activity.  SEEK IMMEDIATE MEDICAL CARE IF:  You have difficulty breathing.  You have moderate to large ketone levels.   This information is not intended to replace advice given to you by your health care provider. Make sure you discuss any questions you have with your health care provider.   Document Released: 02/05/2005 Document Revised: 10/27/2014 Document Reviewed: 09/04/2011 Elsevier Interactive Patient Education 2016 [employer].  Metformin tablets What is this medicine? METFORMIN (met FOR min) is used to treat type 2 diabetes. It helps to control blood sugar. Treatment is combined with diet and exercise. This medicine can be used alone or with other medicines for diabetes. This medicine may be used for other purposes; ask your health care provider or pharmacist if you have questions. What should I tell my health care provider before I take this medicine? They need to know if you have any of these conditions: -anemia -frequently drink  alcohol-containing beverages -become easily dehydrated -heart attack -heart failure that is treated with medications -kidney disease -liver disease -polycystic ovary syndrome -serious infection or injury -vomiting -an unusual or allergic reaction to metformin, other medicines, foods, dyes, or preservatives -pregnant or trying to get pregnant -breast-feeding How should I use this medicine? Take this medicine by mouth. Take it with meals. Swallow the tablets with a drink of water. Follow the directions on the prescription label. Take your medicine at regular intervals. Do not take your medicine more often than directed. Talk to your pediatrician  regarding the use of this medicine in children. While this drug may be prescribed for children as young as 78 years of age for selected conditions, precautions do apply. Overdosage: If you think you have taken too much of this medicine contact a poison control center or emergency room at once. NOTE: This medicine is only for you. Do not share this medicine with others. What if I miss a dose? If you miss a dose, take it as soon as you can. If it is almost time for your next dose, take only that dose. Do not take double or extra doses. What may interact with this medicine? Do not take this medicine with any of the following medications: -dofetilide -gatifloxacin -certain contrast medicines given before X-rays, CT scans, MRI, or other procedures This medicine may also interact with the following medications: -acetazolamide -certain medicines for HIV infection or hepatitis, like adefovir, emtricitabine, entecavir, lamivudine, or tenofovir -cimetidine -crizotinib -digoxin -diuretics -male hormones, like estrogens or progestins and birth control pills -glycopyrrolate -isoniazid -lamotrigine -medicines for blood pressure, heart disease, irregular heart beat -memantine -midodrine -methazolamide -morphine -nicotinic acid -phenothiazines like  chlorpromazine, mesoridazine, prochlorperazine, thioridazine -phenytoin -procainamide -propantheline -quinidine -quinine -ranitidine -ranolazine -steroid medicines like prednisone or cortisone -stimulant medicines for attention disorders, weight loss, or to stay awake -thyroid medicines -topiramate -trimethoprim -trospium -vancomycin -vandetanib -zonisamide This list may not describe all possible interactions. Give your health care provider a list of all the medicines, herbs, non-prescription drugs, or dietary supplements you use. Also tell them if you smoke, drink alcohol, or use illegal drugs. Some items may interact with your medicine. What should I watch for while using this medicine? Visit your doctor or health care professional for regular checks on your progress. A test called the HbA1C (A1C) will be monitored. This is a simple blood test. It measures your blood sugar control over the last 2 to 3 months. You will receive this test every 3 to 6 months. Learn how to check your blood sugar. Learn the symptoms of low and high blood sugar and how to manage them. Always carry a quick-source of sugar with you in case you have symptoms of low blood sugar. Examples include hard sugar candy or glucose tablets. Make sure others know that you can choke if you eat or drink when you develop serious symptoms of low blood sugar, such as seizures or unconsciousness. They must get medical help at once. Tell your doctor or health care professional if you have high blood sugar. You might need to change the dose of your medicine. If you are sick or exercising more than usual, you might need to change the dose of your medicine. Do not skip meals. Ask your doctor or health care professional if you should avoid alcohol. Many nonprescription cough and cold products contain sugar or alcohol. These can affect blood sugar. This medicine may cause ovulation in premenopausal women who do not have regular monthly  periods. This may increase your chances of becoming pregnant. You should not take this medicine if you become pregnant or think you may be pregnant. Talk with your doctor or health care professional about your birth control options while taking this medicine. Contact your doctor or health care professional right away if think you are pregnant. If you are going to need surgery, a MRI, CT scan, or other procedure, tell your doctor that you are taking this medicine. You may need to stop taking this medicine before the procedure. Wear a medical ID bracelet or chain,  and carry a card that describes your disease and details of your medicine and dosage times. What side effects may I notice from receiving this medicine? Side effects that you should report to your doctor or health care professional as soon as possible: -allergic reactions like skin rash, itching or hives, swelling of the face, lips, or tongue -breathing problems -feeling faint or lightheaded, falls -muscle aches or pains -signs and symptoms of low blood sugar such as feeling anxious, confusion, dizziness, increased hunger, unusually weak or tired, sweating, shakiness, cold, irritable, headache, blurred vision, fast heartbeat, loss of consciousness -slow or irregular heartbeat -unusual stomach pain or discomfort -unusually tired or weak Side effects that usually do not require medical attention (report to your doctor or health care professional if they continue or are bothersome): -diarrhea -headache -heartburn -metallic taste in mouth -nausea -stomach gas, upset This list may not describe all possible side effects. Call your doctor for medical advice about side effects. You may report side effects to FDA at 1-800-FDA-1088. Where should I keep my medicine? Keep out of the reach of children. Store at room temperature between 15 and 30 degrees C (59 and 86 degrees F). Protect from moisture and light. Throw away any unused medicine after  the expiration date. NOTE: This sheet is a summary. It may not cover all possible information. If you have questions about this medicine, talk to your doctor, pharmacist, or health care provider.    2016, Elsevier/[employer]. (2013-07-21 22:14:40)  Acetaminophen; Hydrocodone tablets or capsules What is this medicine? ACETAMINOPHEN; HYDROCODONE (a set a MEE noe fen; hye droe KOE done) is a pain reliever. It is used to treat moderate to severe pain. This medicine may be used for other purposes; ask your health care provider or pharmacist if you have questions. What should I tell my health care provider before I take this medicine? They need to know if you have any of these conditions: -brain tumor -Crohn's disease, inflammatory bowel disease, or ulcerative colitis -drug abuse or addiction -head injury -heart or circulation problems -if you often drink alcohol -kidney disease or problems going to the bathroom -liver disease -lung disease, asthma, or breathing problems -an unusual or allergic reaction to acetaminophen, hydrocodone, other opioid analgesics, other medicines, foods, dyes, or preservatives -pregnant or trying to get pregnant -breast-feeding How should I use this medicine? Take this medicine by mouth. Swallow it with a full glass of water. Follow the directions on the prescription label. If the medicine upsets your stomach, take the medicine with food or milk. Do not take more than you are told to take. Talk to your pediatrician regarding the use of this medicine in children. This medicine is not approved for use in children. Patients over 65 years may have a stronger reaction and need a smaller dose. Overdosage: If you think you have taken too much of this medicine contact a poison control center or emergency room at once. NOTE: This medicine is only for you. Do not share this medicine with others. What if I miss a dose? If you miss a dose, take it as soon as you can. If it  is almost time for your next dose, take only that dose. Do not take double or extra doses. What may interact with this medicine? -alcohol -antihistamines -isoniazid -medicines for depression, anxiety, or psychotic disturbances -medicines for sleep -muscle relaxants -naltrexone -narcotic medicines (opiates) for pain -phenobarbital -ritonavir -tramadol This list may not describe all possible interactions. Give your health care provider a  list of all the medicines, herbs, non-prescription drugs, or dietary supplements you use. Also tell them if you smoke, drink alcohol, or use illegal drugs. Some items may interact with your medicine. What should I watch for while using this medicine? Tell your doctor or health care professional if your pain does not go away, if it gets worse, or if you have new or a different type of pain. You may develop tolerance to the medicine. Tolerance means that you will need a higher dose of the medicine for pain relief. Tolerance is normal and is expected if you take the medicine for a long time. Do not suddenly stop taking your medicine because you may develop a severe reaction. Your body becomes used to the medicine. This does NOT mean you are addicted. Addiction is a behavior related to getting and using a drug for a non-medical reason. If you have pain, you have a medical reason to take pain medicine. Your doctor will tell you how much medicine to take. If your doctor wants you to stop the medicine, the dose will be slowly lowered over time to avoid any side effects. You may get drowsy or dizzy when you first start taking the medicine or change doses. Do not drive, use machinery, or do anything that may be dangerous until you know how the medicine affects you. Stand or sit up slowly. There are different types of narcotic medicines (opiates) for pain. If you take more than one type at the same time, you may have more side effects. Give your health care provider a list of  all medicines you use. Your doctor will tell you how much medicine to take. Do not take more medicine than directed. Call emergency for help if you have problems breathing. The medicine will cause constipation. Try to have a bowel movement at least every 2 to 3 days. If you do not have a bowel movement for 3 days, call your doctor or health care professional. Too much acetaminophen can be very dangerous. Do not take Tylenol (acetaminophen) or medicines that contain acetaminophen with this medicine. Many non-prescription medicines contain acetaminophen. Always read the labels carefully. What side effects may I notice from receiving this medicine? Side effects that you should report to your doctor or health care professional as soon as possible: -allergic reactions like skin rash, itching or hives, swelling of the face, lips, or tongue -breathing problems -confusion -feeling faint or lightheaded, falls -stomach pain -yellowing of the eyes or skin Side effects that usually do not require medical attention (report to your doctor or health care professional if they continue or are bothersome): -nausea, vomiting -stomach upset This list may not describe all possible side effects. Call your doctor for medical advice about side effects. You may report side effects to FDA at 1-800-FDA-1088. Where should I keep my medicine? Keep out of the reach of children. This medicine can be abused. Keep your medicine in a safe place to protect it from theft. Do not share this medicine with anyone. Selling or giving away this medicine is dangerous and against the law. This medicine may cause accidental overdose and death if it taken by other adults, children, or pets. Mix any unused medicine with a substance like cat litter or coffee grounds. Then throw the medicine away in a sealed container like a sealed bag or a coffee can with a lid. Do not use the medicine after the expiration date. Store at room temperature between  15 and 30 degrees C (59  and 86 degrees F). NOTE: This sheet is a summary. It may not cover all possible information. If you have questions about this medicine, talk to your doctor, pharmacist, or health care provider.    2016, Elsevier/Gold Standard. (2014-01-06 15:29:20)   Emergency Department Resource Guide 1) Find a Doctor and Pay Out of Pocket Although you won't have to find out who is covered by your insurance plan, it is a good idea to ask around and get recommendations. You will then need to call the office and see if the doctor you have chosen will accept you as a new patient and what types of options they offer for patients who are self-pay. Some doctors offer discounts or will set up payment plans for their patients who do not have insurance, but you will need to ask so you aren't surprised when you get to your appointment.  2) Contact Your Local Health Department Not all health departments have doctors that can see patients for sick visits, but many do, so it is worth a call to see if yours does. If you don't know where your local health department is, you can check in your phone book. The CDC also has a tool to help you locate your state's health department, and many state websites also have listings of all of their local health departments.  3) Find a Walk-in Clinic If your illness is not likely to be very severe or complicated, you may want to try a walk in clinic. These are popping up all over the country in pharmacies, drugstores, and shopping centers. They're usually staffed by nurse practitioners or physician assistants that have been trained to treat common illnesses and complaints. They're usually fairly quick and inexpensive. However, if you have serious medical issues or chronic medical problems, these are probably not your best option.  No Primary Care Doctor: - Call Health Connect at  [phone] - they can help you locate a primary care doctor that  accepts your insurance,  provides certain services, etc. - Physician Referral Service- [phone]  Chronic Pain Problems: Organization         Address  Phone   Notes  [facility]  [phone] Patients need to be referred by their primary care doctor.   Medication Assistance: Organization         Address  Phone   Notes  [facility] Medication [facility] [address] [place], [place] [address] [phone] --Must be a resident of [facility] -- Must have NO insurance coverage whatsoever (no Medicaid/ Medicare, etc.) -- The pt. MUST have a primary care doctor that directs their care regularly and follows them in the community   MedAssist  [phone]   [employer]  [phone]    Agencies that provide inexpensive medical care: Organization         Address  Phone   Notes  [facility]  [phone]   [facility]    [phone]   [facility] [address] [place], [place] [address] [phone]   [facility] [address] [place] [place] [phone]   Planned Parenthood    [phone]   [facility]    [phone]   [facility] and [facility]  [address], [place] Phone:  [phone], Fax:  [phone] Hours of Operation:  9 am - 6 pm, M-F.  Also accepts Medicaid/Medicare  and self-pay.  [facility] for [address], [address], [place] Phone: [phone], Fax: [phone]. Hours of Operation:  8:30 am - 5:30 pm, M-F.  Also accepts Medicaid and self-pay.  [facility] [address] [place] Phone: [phone]   [place], [place], [place] [phone], Ext. [phone] Mondays & Thursdays: 7-9 AM.  First 15 patients are seen on a first come, first serve basis.    [place] Providers:  Organization         Address  Phone    Notes  [facility] [address], [place] [phone] Also accepts self-pay patients.  [facility] [address], [place]  [phone]   [place], [address], [place] [phone]   [facility] [address] [place] [phone]   Lucianne Lei [address], [place]   [phone] Only accepts [place] Access [place] patients after they have their name applied to their card.   Self-Pay (no insurance) in [facility]:  Organization         Address  Phone   Notes  Sickle Cell Patients, [facility] [place] [phone]   [facility] [place] [phone]   [facility] [place]  [place], [place], [place] [phone]   [facility]/Dr. Osei-Bonsu  [address] [place] or [address], [place] [phone] Phone number for both [place] and [place] locations is the same.  Urgent Medical and [facility] [address] [place] [phone]   [facility] [address] [place] or [address] [phone]   [facility] [address] [place] [phone], phone; [phone], fax Sees patients 1st and 3rd Saturday of every month.  Must not qualify for public or private insurance (i.e. Medicaid, Medicare, [place] Health Choice, Veterans' Benefits)  Household income should be no more than 200% of the poverty level The clinic cannot treat you if you are pregnant or think you are pregnant  Sexually transmitted diseases are not treated at the clinic.    Dental Care: Organization         Address  Phone  Notes  [facility] Department of [facility] [place] [phone] Accepts children up to age 87 who are enrolled in [place] or [place]; pregnant women with a Medicaid card; and children who have applied for Medicaid or [place] Health Choice, but were declined, whose parents can pay a reduced fee at time of service.  [facility] Department of [facility]  [address], [place] [phone] Accepts children up to age 37 who are enrolled in [place] or [place]; pregnant women with a Medicaid card; and children who have applied for Medicaid or [place] Health Choice, but were declined, whose parents can pay a reduced fee at time of service.  [place] Adult [facility] Access PROGRAM  [place] [phone] Patients are seen by appointment only. Walk-ins are not accepted. [place] will see patients 36 years of age and older. Monday - Tuesday (8am-5pm) Most Wednesdays (  8:30-5pm) $30 per visit, cash only  [facility] Adult Dental Access PROGRAM  [address], [facility] [phone] Patients are seen by appointment only. Walk-ins are not accepted. [place] will see patients 81 years of age and older. One Wednesday Evening (Monthly: Volunteer Based).  $30 per visit, cash only  [place]  [phone] for adults; Children under age 43, call Graduate Pediatric Dentistry at [phone]. Children aged 15-14, please call [phone] to request a pediatric application.  Dental services are provided in all areas of dental care including fillings, crowns and bridges, complete and partial dentures, implants, gum treatment, root canals, and extractions. Preventive care is also provided. Treatment is provided to both adults and children. Patients are selected via a lottery and there is often a waiting list.   [facility] [address], [place]  [phone] [email or web address]   [facility] [address] [place], [place] [phone], Ext.  123 Second and Fourth Thursday of each month, opens at 6:30 AM; Clinic ends at 9 AM.  Patients are seen on a first-come first-served basis, and a limited number are seen during each clinic.   [facility]  [address] Hillard Danker [place], [place] [phone]   Eligibility Requirements You must have lived in [place], [place], or [place] counties for at least the last three months.   You cannot be eligible for state or federal sponsored [employer], including [employer], [place], or [employer].   You generally cannot be eligible for healthcare insurance through your employer.    How to apply: Eligibility screenings are held every Tuesday and Wednesday afternoon from 1:00 pm until 4:00 pm. You do not need an appointment for the interview!  [facility] [address], [place], [place]   [place]  [place] Department  [place]  [phone]    Behavioral Health Resources in the Community: Intensive Outpatient Programs Organization         Address  Phone  Notes  [place]. [address], [place], [place] [phone]   [facility] Outpatient [address], [place], [place]   ADS: [facility] Drug S[facility] [address], [place], [place]   [place] [address]  [place], [place] or [phone]   Substance Abuse Resources Organization         Address  Phone  Notes  Alcohol and Drug Services  [phone]   [place]  [phone]   [facility] [place]   Chinita Pester  [phone]   Residential & Outpatient Substance Abuse Program  [phone]   Psychological Services Organization         Address  Phone  Notes  [facility] [place]  [place]  [phone]   [place] [address], [place] or [phone]    Mobile Crisis Teams Organization         Address  Phone  Notes  [facility], Mobile Crisis Care Unit  [phone]   [facility]  [address] [place], [place]   [place] [address], [place] [phone]    Self-Help/Support Groups Organization         Address  Phone  Notes  [facility] [place] - variety of support groups  [place] Call for more information  Narcotics Anonymous (NA), [facility] [address], [employer] [place]  2 meetings at this location   Special educational needs teacher         Address  Phone  Notes  [facility] [place],    [place]  [phone]   [facility]  [address], [place] [address], [place], [place]   [place], [place] [phone] Admissions: 8am-3pm M-F  Incentives Substance [place] [address]    [place], [place] [phone]   [facility] [address] [place], [place], [place]   [facility] [address],  [place], [place]   [facility] - Intensive Outpatient [address]een [address], [place], [place]   [facility] [place]  [place], [place] [phone] or [phone]   [facility] ([facility]) [address], [place], [place] Accepts Medicaid  [facility] [address],  [place] [phone] Substance Abuse/Addiction Treatment   [facility] Organization         Address  Phone  Notes  [facility]  [phone]   Domenic Schwab, PhD [address] Arlis Porta [place], [place]   [phone] or [phone]   [place]  [place], [place] [phone]   [facility] [address], [place], [place] [phone] Insurance/Medicaid/sponsorship through [facility] and Families [address], [address] [place]                                    [place], [place] [phone] [place] [address] [place], [place] [phone]    Dr. Adele Schilder  [phone]   [facility] [place] Dept. 1) [address], [place] 2) [place] 3)  [place] [address], [place] [phone]  [phone]   [place] [phone] or [phone] (After Hours)

## 2015-11-03 ENCOUNTER — Encounter (HOSPITAL_COMMUNITY): Payer: Self-pay | Admitting: Emergency Medicine

## 2015-11-03 ENCOUNTER — Emergency Department (HOSPITAL_COMMUNITY)
Admission: EM | Admit: 2015-11-03 | Discharge: 2015-11-03 | Disposition: A | Discharge status: Home / Self Care | Payer: Self-pay | Attending: Emergency Medicine | Admitting: Emergency Medicine

## 2015-11-03 DIAGNOSIS — J020 Streptococcal pharyngitis: Secondary | ICD-10-CM | POA: Insufficient documentation

## 2015-11-03 DIAGNOSIS — Z7984 Long term (current) use of oral hypoglycemic drugs: Secondary | ICD-10-CM | POA: Insufficient documentation

## 2015-11-03 LAB — RAPID STREP SCREEN (MED CTR MEBANE ONLY): Streptococcus, Group A Screen (Direct): POSITIVE — AB

## 2015-11-03 MED ORDER — AMOXICILLIN 500 MG PO CAPS: 1000.0000 mg | ORAL_CAPSULE | Freq: Every day | ORAL | 0 refills | Status: AC

## 2015-11-03 NOTE — ED Provider Notes (Signed)
[facility]-EMERGENCY DEPT Provider Note   CSN: [id number] Arrival date & time: 11/03/15  1254  By signing my name below, I, Christel MormonMatthew Jamison, attest that this documentation has been prepared under the direction and in the presence of Creed Kail L. Mayson Mcneish, PA-C. Electronically Signed: Christel MormonMatthew Jamison, Scribe. 11/03/2015. 4:16 PM.    History   Chief Complaint Chief Complaint  Patient presents with  . Sore Throat    The history is provided by the patient. No language interpreter was used.   HPI Comments:  Jerry Arnold is a 30 y.o. male who presents to the Emergency Department complaining of a constant sore throat that began 2-3 days ago. Pt states that pain is worsened when swallowing. Pt complains of associated non-productive cough. Pt notes possible sick contact, drank from a container of someone who had strep recently. Pt denies fatigue, headache, dizziness, fever, nausea, vomiting.   History reviewed. No pertinent past medical history.  There are no active problems to display for this patient.   History reviewed. No pertinent surgical history.     Home Medications    Prior to Admission medications   Medication Sig Start Date End Date Taking? Authorizing Provider  amoxicillin (AMOXIL) 500 MG capsule Take 2 capsules (1,000 mg total) by mouth daily. 11/03/15 11/13/15  Jerre SimonJessica L Zaineb Nowaczyk, PA  HYDROcodone-acetaminophen (NORCO) 5-325 MG tablet Take 1 tablet by mouth every 4 (four) hours as needed (coughing - use only at night). 03/24/15   Dione Boozeavid Glick, MD  metFORMIN (GLUCOPHAGE) 500 MG tablet Take 1 tablet (500 mg total) by mouth 2 (two) times daily with a meal. 03/24/15   Dione Boozeavid Glick, MD  Pseudoephedrine-APAP-DM (DAYQUIL MULTI-SYMPTOM PO) Take 30 mLs by mouth daily as needed (for cold).    Historical Provider, MD    Family History History reviewed. No pertinent family history.  Social History Social History  Substance Use Topics  . Smoking status: Never Smoker  . Smokeless tobacco: Not on  file  . Alcohol use Yes     Comment: socially     Allergies   Review of patient's allergies indicates no known allergies.   Review of Systems Review of Systems  Constitutional: Negative for fatigue and fever.  HENT: Positive for sore throat and trouble swallowing.   Gastrointestinal: Negative for nausea and vomiting.  Neurological: Negative for dizziness and headaches.     Physical Exam Updated Vital Signs BP 138/96   Pulse 99   Temp 98.4 F (36.9 C) (Oral)   Resp 16   SpO2 96%   Physical Exam  Constitutional: He appears well-developed and well-nourished. No distress.  HENT:  Head: Normocephalic and atraumatic.  Mouth/Throat: Uvula is midline and mucous membranes are normal. No trismus in the jaw. No uvula swelling. Oropharyngeal exudate, posterior oropharyngeal edema and posterior oropharyngeal erythema present. No tonsillar abscesses.  Tonsillar redness and swelling.   Eyes: Conjunctivae are normal.  Cardiovascular: Normal rate, regular rhythm and normal heart sounds.  Exam reveals no gallop and no friction rub.   No murmur heard. Pulmonary/Chest: Effort normal and breath sounds normal. No respiratory distress. He has no wheezes. He has no rales.  Musculoskeletal: Normal range of motion.  Lymphadenopathy:    He has cervical adenopathy.  Neurological: He is alert. Coordination normal.  Skin: Skin is warm and dry. He is not diaphoretic.  Psychiatric: He has a normal mood and affect. His behavior is normal.  Nursing note and vitals reviewed.    ED Treatments / Results  DIAGNOSTIC STUDIES:  Oxygen Saturation  is 96% on RA, adequate by my interpretation.    COORDINATION OF CARE:  4:16 PM Discussed treatment plan with pt at bedside and pt agreed to plan.   Labs (all labs ordered are listed, but only abnormal results are displayed) Labs Reviewed  RAPID STREP SCREEN (NOT AT [facility]) - Abnormal; Notable for the following:       Result Value   Streptococcus, Group A  Screen (Direct) POSITIVE (*)    All other components within normal limits    EKG  EKG Interpretation None       Radiology No results found.  Procedures Procedures (including critical care time)  Medications Ordered in ED Medications - No data to display   Initial Impression / Assessment and Plan / ED Course  I have reviewed the triage vital signs and the nursing notes.  Pertinent labs & imaging results that were available during my care of the patient were reviewed by me and considered in my medical decision making (see chart for details).  Clinical Course   Pt afebrile with tonsillar exudate, cervical lymphadenopathy, & dysphagia; diagnosis of strepVia positive strep test. Pt d/c with amoxicillin. Discussed importance of water rehydration. Presentation non concerning for PTA or infxn spread to soft tissue. No trismus or uvula deviation. Instructed follow-up with primary care provider in 2 days if symptoms are not improving. Pt able to drink water in ED without difficulty with intact air way.  Discussed strict return precautions. Patient expressed understanding to the discharge instructions.  I personally performed the services described in this documentation, which was scribed in my presence. The recorded information has been reviewed and is accurate.   Final Clinical Impressions(s) / ED Diagnoses   Final diagnoses:  Strep throat    New Prescriptions New Prescriptions   AMOXICILLIN (AMOXIL) 500 MG CAPSULE    Take 2 capsules (1,000 mg total) by mouth daily.     Jerre Simon, PA 11/03/15 1628    Lyndal Pulley, MD 11/05/15 [phone]

## 2015-11-03 NOTE — Discharge Instructions (Signed)
Take the amoxicillin as prescribed to be sure to complete the entire course. Discontinue this medication if you experience diarrhea or rash and returned immediately to the emergency department. Follow-up with your primary care provider or the [facility] 2 days of your symptoms are not improving.  Return mainly to the emergency department if you experience worsening swelling of your throat, worsening pain, inability to swallow, inability to open your mouth, fevers, nausea, vomiting or any other concerning symptoms.

## 2015-11-03 NOTE — Progress Notes (Signed)
Pt states his sore throat pain is a 3.5/10. He admits he was at a party and drank after a person who was dx with a strep throat

## 2015-11-03 NOTE — ED Triage Notes (Signed)
Pt reports he has had a sore throat for the past few days accompanied by a cough. Pt drank after another person who found out he had strep throat a few days ago.

## 2015-11-03 NOTE — Progress Notes (Signed)
CM spoke with pt who confirms uninsured [place] resident with [place] pcp.  CM discussed and provided written information to assist pt with determining choice for uninsured accepting pcps, discussed the importance of pcp vs EDP services for f/u care, www.needymeds.org, www.goodrx.com, discounted pharmacies and other [place] resources such as [facility] , [facility], affordable care act, financial assistance, uninsured dental services, [place] med assist, DSS and  health department  Reviewed resources for [place] uninsured accepting pcps like [facility] [place], family medicine at [address], [facility], [facility], local urgent care centers, [facility], [facility], general medical clinics, [facility] [place], [facility] plus others, medication resources, [facility] out patient pharmacies and housing Pt voiced understanding and appreciation of resources provided   Provided [facility] contact information Pt agreed to a referral Cm completed referral Pt to be contact by [facility] clinical liaison  

## 2016-12-19 ENCOUNTER — Encounter (HOSPITAL_COMMUNITY): Payer: Self-pay | Admitting: Emergency Medicine

## 2016-12-19 ENCOUNTER — Emergency Department (HOSPITAL_COMMUNITY)
Admission: EM | Admit: 2016-12-19 | Discharge: 2016-12-19 | Disposition: A | Discharge status: Home / Self Care | Payer: Self-pay | Attending: Emergency Medicine | Admitting: Emergency Medicine

## 2016-12-19 DIAGNOSIS — L738 Other specified follicular disorders: Secondary | ICD-10-CM

## 2016-12-19 DIAGNOSIS — L731 Pseudofolliculitis barbae: Secondary | ICD-10-CM | POA: Insufficient documentation

## 2016-12-19 MED ORDER — DOXYCYCLINE HYCLATE 100 MG PO TABS
100.0000 mg | ORAL_TABLET | Freq: Once | ORAL | Status: AC
  Administered 2016-12-19: 100 mg via ORAL
  Filled 2016-12-19: qty 1

## 2016-12-19 MED ORDER — IBUPROFEN 800 MG PO TABS
800.0000 mg | ORAL_TABLET | Freq: Once | ORAL | Status: AC
  Administered 2016-12-19: 800 mg via ORAL
  Filled 2016-12-19: qty 1

## 2016-12-19 MED ORDER — DOXYCYCLINE HYCLATE 100 MG PO CAPS: 100.0000 mg | ORAL_CAPSULE | Freq: Two times a day (BID) | ORAL | 0 refills | Status: DC

## 2016-12-19 MED ORDER — CLINDAMYCIN PHOS-BENZOYL PEROX 1-5 % EX GEL: Freq: Two times a day (BID) | CUTANEOUS | 0 refills | Status: DC

## 2016-12-19 NOTE — ED Provider Notes (Signed)
[facility]-EMERGENCY DEPT Provider Note   CSN: [id number] Arrival date & time: 12/19/16  0305     History   Chief Complaint Chief Complaint  Patient presents with  . Abscess    HPI Jerry Arnold is a 31 y.o. male.  31 year old male presents to the emergency department for evaluation of a rash to his posterior neck.  He states that he has developed this after the last few times of getting his haircut.  He has noticed increased pain and swelling to the back of his neck recently.  He states that pain has been well controlled with ibuprofen.  He expresses concern about persistence of this swelling.  He has had no drainage or fever.  No neck stiffness.      History reviewed. No pertinent past medical history.  There are no active problems to display for this patient.   History reviewed. No pertinent surgical history.     Home Medications    Prior to Admission medications   Medication Sig Start Date End Date Taking? Authorizing Provider  clindamycin-benzoyl peroxide (BENZACLIN) gel Apply topically 2 (two) times daily. 12/19/16   Antony MaduraHumes, Mckenna Boruff, PA-C  doxycycline (VIBRAMYCIN) 100 MG capsule Take 1 capsule (100 mg total) by mouth 2 (two) times daily. 12/19/16   Antony MaduraHumes, Neola Worrall, PA-C  HYDROcodone-acetaminophen (NORCO) 5-325 MG tablet Take 1 tablet by mouth every 4 (four) hours as needed (coughing - use only at night). 03/24/15   Dione BoozeGlick, David, MD  metFORMIN (GLUCOPHAGE) 500 MG tablet Take 1 tablet (500 mg total) by mouth 2 (two) times daily with a meal. 03/24/15   Dione BoozeGlick, David, MD  Pseudoephedrine-APAP-DM (DAYQUIL MULTI-SYMPTOM PO) Take 30 mLs by mouth daily as needed (for cold).    [provider]    Family History Family History  Problem Relation Age of Onset  . Hypertension Mother   . Hypertension Maternal Grandmother   . Hypertension Paternal Grandmother     Social History Social History  Substance Use Topics  . Smoking status: Never Smoker    . Smokeless tobacco: Never Used  . Alcohol use No     Comment: socially     Allergies   Patient has no known allergies.   Review of Systems Review of Systems Ten systems reviewed and are negative for acute change, except as noted in the HPI.    Physical Exam Updated Vital Signs BP (!) 162/107 (BP Location: Left Arm)   Pulse 96   Temp 98.8 F (37.1 C) (Oral)   Resp 16   Ht 6\' 1"  (1.854 m)   Wt 106.6 kg (235 lb)   SpO2 100%   BMI 31.00 kg/m   Physical Exam  Constitutional: He is oriented to person, place, and time. He appears well-developed and well-nourished. No distress.  Nontoxic appearing and in NAD  HENT:  Head: Normocephalic and atraumatic.  Eyes: Conjunctivae and EOM are normal. No scleral icterus.  Neck: Normal range of motion.  Papules to lower hairline on posterior neck. Small area of induration associated with papules. No fluctuance or drainage. No pustules or vesicles.  Cardiovascular: Normal rate, regular rhythm and intact distal pulses.   Pulmonary/Chest: Effort normal. No respiratory distress.  Respirations even and unlabored  Musculoskeletal: Normal range of motion.  Neurological: He is alert and oriented to person, place, and time. He exhibits normal muscle tone. Coordination normal.  Skin: Skin is warm and dry. Rash noted. He is not diaphoretic. No erythema. No pallor.  Psychiatric: He has a normal  mood and affect. His behavior is normal.  Nursing note and vitals reviewed.    ED Treatments / Results  Labs (all labs ordered are listed, but only abnormal results are displayed) Labs Reviewed - No data to display  EKG  EKG Interpretation None       Radiology No results found.  Procedures Procedures (including critical care time)  Medications Ordered in ED Medications  doxycycline (VIBRA-TABS) tablet 100 mg (not administered)  ibuprofen (ADVIL,MOTRIN) tablet 800 mg (not administered)     Initial Impression / Assessment and Plan / ED  Course  I have reviewed the triage vital signs and the nursing notes.  Pertinent labs & imaging results that were available during my care of the patient were reviewed by me and considered in my medical decision making (see chart for details).     31 year old male with clinical findings consistent with folliculitis barbae.  Will manage with oral doxycycline given area of induration which may represent small area of cellulitis.  No distinct palpable abscess.  Patient also given prescription for topical BenzaClin gel.  He has been recommended to see his primary care doctor for follow-up. Return precautions discussed and provided. Patient discharged in stable condition with no unaddressed concerns.   Final Clinical Impressions(s) / ED Diagnoses   Final diagnoses:  Folliculitis barbae    New Prescriptions New Prescriptions   CLINDAMYCIN-BENZOYL PEROXIDE (BENZACLIN) GEL    Apply topically 2 (two) times daily.   DOXYCYCLINE (VIBRAMYCIN) 100 MG CAPSULE    Take 1 capsule (100 mg total) by mouth 2 (two) times daily.     Antony Madura, PA-C 12/19/16 1610    Shon Baton, MD 12/19/16 [phone]

## 2016-12-19 NOTE — ED Triage Notes (Signed)
Pt states the last several times he has had a haircut he has developed bumps on the back of his neck at the base of his skull  Pt states this past time the bumps did not go away and now that area is swollen and painful

## 2016-12-19 NOTE — Discharge Instructions (Signed)
We recommend application of topical BenzaClin gel.  Take doxycycline as prescribed until finished.  Continue 600 mg ibuprofen every 6 hours for pain control.  Follow-up with a primary care doctor to ensure resolution of symptoms.

## 2017-04-11 ENCOUNTER — Emergency Department (HOSPITAL_COMMUNITY): Payer: Self-pay

## 2017-04-11 ENCOUNTER — Inpatient Hospital Stay (HOSPITAL_COMMUNITY)
Admission: EM | Admit: 2017-04-11 | Discharge: 2017-04-20 | DRG: 616 | Disposition: A | Discharge status: Home / Self Care | Payer: Self-pay | Attending: Internal Medicine | Admitting: Internal Medicine

## 2017-04-11 ENCOUNTER — Other Ambulatory Visit: Payer: Self-pay

## 2017-04-11 ENCOUNTER — Encounter (HOSPITAL_COMMUNITY): Payer: Self-pay

## 2017-04-11 DIAGNOSIS — N179 Acute kidney failure, unspecified: Secondary | ICD-10-CM

## 2017-04-11 DIAGNOSIS — E1069 Type 1 diabetes mellitus with other specified complication: Principal | ICD-10-CM | POA: Diagnosis present

## 2017-04-11 DIAGNOSIS — L03119 Cellulitis of unspecified part of limb: Secondary | ICD-10-CM

## 2017-04-11 DIAGNOSIS — L97519 Non-pressure chronic ulcer of other part of right foot with unspecified severity: Secondary | ICD-10-CM | POA: Diagnosis present

## 2017-04-11 DIAGNOSIS — E1043 Type 1 diabetes mellitus with diabetic autonomic (poly)neuropathy: Secondary | ICD-10-CM | POA: Diagnosis not present

## 2017-04-11 DIAGNOSIS — IMO0001 Reserved for inherently not codable concepts without codable children: Secondary | ICD-10-CM

## 2017-04-11 DIAGNOSIS — E10621 Type 1 diabetes mellitus with foot ulcer: Secondary | ICD-10-CM | POA: Diagnosis present

## 2017-04-11 DIAGNOSIS — R059 Cough, unspecified: Secondary | ICD-10-CM

## 2017-04-11 DIAGNOSIS — Z6830 Body mass index (BMI) 30.0-30.9, adult: Secondary | ICD-10-CM

## 2017-04-11 DIAGNOSIS — E1022 Type 1 diabetes mellitus with diabetic chronic kidney disease: Secondary | ICD-10-CM | POA: Diagnosis present

## 2017-04-11 DIAGNOSIS — R112 Nausea with vomiting, unspecified: Secondary | ICD-10-CM

## 2017-04-11 DIAGNOSIS — K3184 Gastroparesis: Secondary | ICD-10-CM | POA: Diagnosis not present

## 2017-04-11 DIAGNOSIS — I96 Gangrene, not elsewhere classified: Secondary | ICD-10-CM

## 2017-04-11 DIAGNOSIS — I129 Hypertensive chronic kidney disease with stage 1 through stage 4 chronic kidney disease, or unspecified chronic kidney disease: Secondary | ICD-10-CM | POA: Diagnosis present

## 2017-04-11 DIAGNOSIS — R739 Hyperglycemia, unspecified: Secondary | ICD-10-CM

## 2017-04-11 DIAGNOSIS — E1052 Type 1 diabetes mellitus with diabetic peripheral angiopathy with gangrene: Secondary | ICD-10-CM | POA: Diagnosis present

## 2017-04-11 DIAGNOSIS — D631 Anemia in chronic kidney disease: Secondary | ICD-10-CM | POA: Diagnosis present

## 2017-04-11 DIAGNOSIS — E43 Unspecified severe protein-calorie malnutrition: Secondary | ICD-10-CM | POA: Diagnosis present

## 2017-04-11 DIAGNOSIS — E119 Type 2 diabetes mellitus without complications: Secondary | ICD-10-CM

## 2017-04-11 DIAGNOSIS — N183 Chronic kidney disease, stage 3 (moderate): Secondary | ICD-10-CM | POA: Diagnosis present

## 2017-04-11 DIAGNOSIS — R1110 Vomiting, unspecified: Secondary | ICD-10-CM

## 2017-04-11 DIAGNOSIS — R05 Cough: Secondary | ICD-10-CM

## 2017-04-11 DIAGNOSIS — Z79899 Other long term (current) drug therapy: Secondary | ICD-10-CM

## 2017-04-11 DIAGNOSIS — D649 Anemia, unspecified: Secondary | ICD-10-CM

## 2017-04-11 DIAGNOSIS — E1065 Type 1 diabetes mellitus with hyperglycemia: Secondary | ICD-10-CM | POA: Diagnosis present

## 2017-04-11 DIAGNOSIS — M86171 Other acute osteomyelitis, right ankle and foot: Secondary | ICD-10-CM

## 2017-04-11 DIAGNOSIS — M869 Osteomyelitis, unspecified: Secondary | ICD-10-CM

## 2017-04-11 DIAGNOSIS — E669 Obesity, unspecified: Secondary | ICD-10-CM | POA: Diagnosis present

## 2017-04-11 DIAGNOSIS — L97509 Non-pressure chronic ulcer of other part of unspecified foot with unspecified severity: Secondary | ICD-10-CM

## 2017-04-11 HISTORY — DX: Osteomyelitis, unspecified: M86.9

## 2017-04-11 HISTORY — DX: Type 2 diabetes mellitus without complications: E11.9

## 2017-04-11 LAB — COMPREHENSIVE METABOLIC PANEL
ALT: 9 U/L — ABNORMAL LOW (ref 17–63)
AST: 12 U/L — ABNORMAL LOW (ref 15–41)
Albumin: 2.7 g/dL — ABNORMAL LOW (ref 3.5–5.0)
Alkaline Phosphatase: 100 U/L (ref 38–126)
Anion gap: 10 (ref 5–15)
BUN: 8 mg/dL (ref 6–20)
CO2: 25 mmol/L (ref 22–32)
Calcium: 8.4 mg/dL — ABNORMAL LOW (ref 8.9–10.3)
Chloride: 100 mmol/L — ABNORMAL LOW (ref 101–111)
Creatinine, Ser: 1.02 mg/dL (ref 0.61–1.24)
GFR calc Af Amer: >60 mL/min (ref >60)
GFR calc non Af Amer: >60 mL/min (ref >60)
Glucose, Bld: 351 mg/dL — ABNORMAL HIGH (ref 65–99)
Potassium: 4.2 mmol/L (ref 3.5–5.1)
Sodium: 135 mmol/L (ref 135–145)
Total Bilirubin: 0.8 mg/dL (ref 0.3–1.2)
Total Protein: 7.7 g/dL (ref 6.5–8.1)

## 2017-04-11 LAB — CBC WITH DIFFERENTIAL/PLATELET
Basophils Absolute: 0 10*3/uL (ref 0.0–0.1)
Basophils Relative: 0 %
Eosinophils Absolute: 0 10*3/uL (ref 0.0–0.7)
Eosinophils Relative: 0 %
HCT: 36.2 % — ABNORMAL LOW (ref 39.0–52.0)
Hemoglobin: 11.8 g/dL — ABNORMAL LOW (ref 13.0–17.0)
Lymphocytes Relative: 17 %
Lymphs Abs: 1.9 10*3/uL (ref 0.7–4.0)
MCH: 27.8 pg (ref 26.0–34.0)
MCHC: 32.6 g/dL (ref 30.0–36.0)
MCV: 85.4 fL (ref 78.0–100.0)
Monocytes Absolute: 0.9 10*3/uL (ref 0.1–1.0)
Monocytes Relative: 8 %
Neutro Abs: 8.5 10*3/uL — ABNORMAL HIGH (ref 1.7–7.7)
Neutrophils Relative %: 75 %
Platelets: 243 10*3/uL (ref 150–400)
RBC: 4.24 MIL/uL (ref 4.22–5.81)
RDW: 13.8 % (ref 11.5–15.5)
WBC: 11.3 10*3/uL — ABNORMAL HIGH (ref 4.0–10.5)

## 2017-04-11 LAB — GLUCOSE, CAPILLARY
Glucose-Capillary: 208 mg/dL — ABNORMAL HIGH (ref 65–99)
Glucose-Capillary: 245 mg/dL — ABNORMAL HIGH (ref 65–99)

## 2017-04-11 LAB — CBG MONITORING, ED: Glucose-Capillary: 323 mg/dL — ABNORMAL HIGH (ref 65–99)

## 2017-04-11 LAB — PROTIME-INR
INR: 1.1
Prothrombin Time: 14.1 seconds (ref 11.4–15.2)

## 2017-04-11 LAB — I-STAT CG4 LACTIC ACID, ED: Lactic Acid, Venous: 1.12 mmol/L (ref 0.5–1.9)

## 2017-04-11 MED ORDER — VANCOMYCIN HCL IN DEXTROSE 1-5 GM/200ML-% IV SOLN
1000.0000 mg | Freq: Three times a day (TID) | INTRAVENOUS | Status: DC
  Administered 2017-04-12 – 2017-04-15 (×10): 1000 mg via INTRAVENOUS
  Filled 2017-04-11 (×11): qty 200

## 2017-04-11 MED ORDER — PIPERACILLIN-TAZOBACTAM 3.375 G IVPB
3.3750 g | Freq: Three times a day (TID) | INTRAVENOUS | Status: DC
  Administered 2017-04-11 – 2017-04-15 (×12): 3.375 g via INTRAVENOUS
  Filled 2017-04-11 (×12): qty 50

## 2017-04-11 MED ORDER — PRO-STAT SUGAR FREE PO LIQD
30.0000 mL | Freq: Two times a day (BID) | ORAL | Status: DC
  Administered 2017-04-11 – 2017-04-18 (×4): 30 mL via ORAL
  Filled 2017-04-11 (×7): qty 30

## 2017-04-11 MED ORDER — ACETAMINOPHEN 325 MG PO TABS
650.0000 mg | ORAL_TABLET | Freq: Four times a day (QID) | ORAL | Status: DC | PRN
  Administered 2017-04-11 – 2017-04-20 (×2): 650 mg via ORAL
  Filled 2017-04-11 (×2): qty 2

## 2017-04-11 MED ORDER — TRAMADOL HCL 50 MG PO TABS
50.0000 mg | ORAL_TABLET | Freq: Four times a day (QID) | ORAL | Status: DC | PRN
  Administered 2017-04-11: 50 mg via ORAL
  Filled 2017-04-11: qty 1

## 2017-04-11 MED ORDER — INSULIN ASPART 100 UNIT/ML ~~LOC~~ SOLN
6.0000 [IU] | Freq: Once | SUBCUTANEOUS | Status: AC
  Administered 2017-04-11: 6 [IU] via INTRAVENOUS
  Filled 2017-04-11: qty 1

## 2017-04-11 MED ORDER — PIPERACILLIN-TAZOBACTAM 3.375 G IVPB 30 MIN
3.3750 g | Freq: Once | INTRAVENOUS | Status: AC
  Administered 2017-04-11: 3.375 g via INTRAVENOUS
  Filled 2017-04-11: qty 50

## 2017-04-11 MED ORDER — VANCOMYCIN HCL 10 G IV SOLR
2000.0000 mg | Freq: Once | INTRAVENOUS | Status: AC
  Administered 2017-04-11: 2000 mg via INTRAVENOUS
  Filled 2017-04-11 (×2): qty 2000

## 2017-04-11 MED ORDER — ONDANSETRON HCL 4 MG/2ML IJ SOLN: 4.0000 mg | Freq: Four times a day (QID) | INTRAMUSCULAR | Status: DC | PRN

## 2017-04-11 MED ORDER — INSULIN ASPART 100 UNIT/ML ~~LOC~~ SOLN
0.0000 [IU] | SUBCUTANEOUS | Status: DC
  Administered 2017-04-11: 3 [IU] via SUBCUTANEOUS
  Administered 2017-04-12: 2 [IU] via SUBCUTANEOUS
  Administered 2017-04-12: 5 [IU] via SUBCUTANEOUS

## 2017-04-11 MED ORDER — SODIUM CHLORIDE 0.9 % IV BOLUS (SEPSIS)
1000.0000 mL | Freq: Once | INTRAVENOUS | Status: AC
  Administered 2017-04-11: 1000 mL via INTRAVENOUS

## 2017-04-11 MED ORDER — ACETAMINOPHEN 325 MG PO TABS
325.0000 mg | ORAL_TABLET | Freq: Once | ORAL | Status: AC
Start: 2017-04-11 — End: 2017-04-11
  Administered 2017-04-11: 325 mg via ORAL
  Filled 2017-04-11: qty 1

## 2017-04-11 MED ORDER — SODIUM CHLORIDE 0.9 % IV SOLN
INTRAVENOUS | Status: DC
  Administered 2017-04-11: 23:00:00 via INTRAVENOUS

## 2017-04-11 MED ORDER — ACETAMINOPHEN 650 MG RE SUPP: 650.0000 mg | Freq: Four times a day (QID) | RECTAL | Status: DC | PRN

## 2017-04-11 NOTE — Progress Notes (Signed)
CBG 208, did not transfer in system. P.J. Henderson NewcomerSexton, RN

## 2017-04-11 NOTE — ED Provider Notes (Signed)
[facility]   CSN: [id number] Arrival date & time: 04/11/17  1645     History   Chief Complaint Chief Complaint  Patient presents with  . Hyperglycemia    HPI Jerry Arnold is a 32 y.o. male.  31yM with R foot pain and swelling. Approximately 3 weeks ago patient states that the nail of his right second toe was lifted off.  He had some mild pain at the site and some bleeding.  Reports that only in the last couple days he has had increasing swelling in the toe and also coming up his foot.  Subjective fevers.  He is a diabetic.   No past medical history on file.  There are no active problems to display for this patient.   History reviewed. No pertinent surgical history.     Home Medications    Prior to Admission medications   Medication Sig Start Date End Date Taking? Authorizing Provider  ibuprofen (ADVIL,MOTRIN) 200 MG tablet Take 600 mg by mouth every 6 (six) hours as needed.   Yes [provider]  insulin aspart protamine- aspart (NOVOLOG MIX 70/30) (70-30) 100 UNIT/ML injection Inject 50 Units into the skin every other day.   Yes [provider]  clindamycin-benzoyl peroxide (BENZACLIN) gel Apply topically 2 (two) times daily. Patient not taking: Reported on 04/11/2017 12/19/16   Antony MaduraHumes, Kelly, PA-C  doxycycline (VIBRAMYCIN) 100 MG capsule Take 1 capsule (100 mg total) by mouth 2 (two) times daily. Patient not taking: Reported on 04/11/2017 12/19/16   Antony MaduraHumes, Kelly, PA-C  HYDROcodone-acetaminophen (NORCO) 5-325 MG tablet Take 1 tablet by mouth every 4 (four) hours as needed (coughing - use only at night). Patient not taking: Reported on 04/11/2017 03/24/15   Dione BoozeGlick, David, MD  metFORMIN (GLUCOPHAGE) 500 MG tablet Take 1 tablet (500 mg total) by mouth 2 (two) times daily with a meal. Patient not taking: Reported on 04/11/2017 03/24/15   Dione BoozeGlick, David, MD  promethazine (PHENERGAN) 25 MG tablet Take 1 tablet (25  mg total) by mouth every 6 (six) hours as needed for nausea or vomiting. Patient not taking: Reported on 02/28/2014 09/26/13 03/24/15  Oswaldo Conroyreech, Victoria, PA-C    Family History Family History  Problem Relation Age of Onset  . Hypertension Mother   . Hypertension Maternal Grandmother   . Hypertension Paternal Grandmother     Social History Social History   Tobacco Use  . Smoking status: Never Smoker  . Smokeless tobacco: Never Used  Substance Use Topics  . Alcohol use: No    Comment: socially  . Drug use: No     Allergies   Patient has no known allergies.   Review of Systems   Physical Exam Updated Vital Signs BP (!) 161/96 (BP Location: Right Arm)   Pulse (!) 117   Temp 100.2 F (37.9 C) (Oral)   Resp 18   SpO2 100%   Physical Exam  Constitutional: He appears well-developed and well-nourished. No distress.  HENT:  Head: Normocephalic and atraumatic.  Eyes: Conjunctivae are normal. Right eye exhibits no discharge. Left eye exhibits no discharge.  Neck: Neck supple.  Cardiovascular: Normal rate, regular rhythm and normal heart sounds. Exam reveals no gallop and no friction rub.  No murmur heard. Pulmonary/Chest: Effort normal and breath sounds normal. No respiratory distress.  Abdominal: Soft. He exhibits no distension. There is no tenderness.  Musculoskeletal: He exhibits edema and tenderness.  Distal tip of the right second toe is necrotic.  Purulent  drainage.  Significant swelling extending up into the foot with some erythema over the dorsum of the foot with increased warmth.  Neurological: He is alert.  Skin: Skin is warm and dry.  Psychiatric: He has a normal mood and affect. His behavior is normal. Thought content normal.  Nursing note and vitals reviewed.    ED Treatments / Results  Labs (all labs ordered are listed, but only abnormal results are displayed) Labs Reviewed  COMPREHENSIVE METABOLIC PANEL - Abnormal; Notable for the following components:       Result Value   Chloride 100 (*)    Glucose, Bld 351 (*)    Calcium 8.4 (*)    Albumin 2.7 (*)    AST 12 (*)    ALT 9 (*)    All other components within normal limits  CBC WITH DIFFERENTIAL/PLATELET - Abnormal; Notable for the following components:   WBC 11.3 (*)    Hemoglobin 11.8 (*)    HCT 36.2 (*)    All other components within normal limits  CBG MONITORING, ED - Abnormal; Notable for the following components:   Glucose-Capillary 323 (*)    All other components within normal limits  CULTURE, BLOOD (ROUTINE X 2)  CULTURE, BLOOD (ROUTINE X 2)  PROTIME-INR  I-STAT CG4 LACTIC ACID, ED    EKG  EKG Interpretation None       Radiology Dg Foot Complete Right  Result Date: 04/11/2017 CLINICAL DATA:  Pt has necrotic 2 toe on his right foot. PT states his toe nail fell of at the end of January, after that he notices some swelling and pain in toe. His toe became infected and he tried to treat at home. Pt is has fever chills and some nv. EXAM: RIGHT FOOT COMPLETE - 3+ VIEW COMPARISON:  None. FINDINGS: There is erosion of the tuft of the distal phalanx second digit back to the level of the proximal metaphysis. No subcutaneous gas clearly noted. IMPRESSION: Osteomyelitis of the distal phalanx second digit Electronically Signed   By: Genevive Bi M.D.   On: 04/11/2017 17:23    Procedures Procedures (including critical care time)  Medications Ordered in ED Medications  vancomycin (VANCOCIN) 2,000 mg in sodium chloride 0.9 % 500 mL IVPB (not administered)  piperacillin-tazobactam (ZOSYN) IVPB 3.375 g (3.375 g Intravenous New Bag/Given 04/11/17 1743)  acetaminophen (TYLENOL) tablet 325 mg (325 mg Oral Given 04/11/17 1742)  sodium chloride 0.9 % bolus 1,000 mL (1,000 mLs Intravenous New Bag/Given 04/11/17 1743)     Initial Impression / Assessment and Plan / ED Course  I have reviewed the triage vital signs and the nursing notes.  Pertinent labs & imaging results that were  available during my care of the patient were reviewed by me and considered in my medical decision making (see chart for details).    Osteomyelitis of the right second toe with cellulitis of the foot.  Antibiotics.  Admission.  Final Clinical Impressions(s) / ED Diagnoses   Final diagnoses:  Osteomyelitis of right foot, unspecified type (HCC)  Cellulitis of foot  Hyperglycemia    ED Discharge Orders    None       Raeford Razor, MD 04/11/17 2353

## 2017-04-11 NOTE — ED Notes (Signed)
Attempted report x1. 

## 2017-04-11 NOTE — ED Triage Notes (Signed)
PT arrives EMS from home with second toe on left foot injury x 2-3 weeks. Since Monday pt has experienced increased pain and toe is now sloughing skin, black, swollen foot, hot to touch. Hx of type 2 DM with high sugars. cbg 413. Pt reports 101 temp at home.  A&Ox4

## 2017-04-11 NOTE — H&P (Addendum)
TRH H&P   Patient Demographics:    Jerry Arnold, is a 32 y.o. male  MRN: [id number]   DOB - April 30, 1985  Admit Date - 04/11/2017  Outpatient Primary MD for the patient is Patient, No Pcp Per  Referring MD/NP/PA:   Virgel Manifold  Outpatient Specialists:   Patient coming from: home  Chief Complaint  Patient presents with  . Hyperglycemia      HPI:    Jerry Arnold  is a 32 y.o. male, w diabetes apparently has had 2nd toe right foot infection since 02/2017.  Pt notes that his toe turned black starting Monday and had subjective fever starting yesterday.   In ED,  Right foot xray IMPRESSION: Osteomyelitis of the distal phalanx second digit   Na 135, K 4.2,  Glucose 351 Bun 8, creatinine 1.02 Alb 2.7, Ast 12, Alt 9  Wbc 11.3, Hgb 11.8, Plt 243  Pt will be admitted for osteomyelitis of the 2nd digit right foot.       Review of systems:    In addition to the HPI above,  + fever No Headache, No changes with Vision or hearing, No problems swallowing food or Liquids, No Chest pain, Cough or Shortness of Breath, No Abdominal pain, No Nausea or Vommitting, Bowel movements are regular, No Blood in stool or Urine, No dysuria, No new skin rashes or bruises,   No new weakness, tingling, numbness in any extremity, No recent weight gain or loss, No polyuria, polydypsia or polyphagia, No significant Mental Stressors.  A full 10 point Review of Systems was done, except as stated above, all other Review of Systems were negative.   With Past History of the following :    Past Medical History:  Diagnosis Date  . Osteomyelitis ([place]) 04/11/2017   with "second toe on right foot" injury x 2-3 weeks (04/11/2017)  . Type II diabetes mellitus ([place])       Past Surgical History:  Procedure Laterality Date  . NO PAST SURGERIES        Social History:     Social  History   Tobacco Use  . Smoking status: Never Smoker  . Smokeless tobacco: Never Used  Substance Use Topics  . Alcohol use: Yes    Comment: 04/11/2017 "might have a few drinks/year"     Lives - at home, uninsured  Mobility - walks by self    Family History :     Family History  Problem Relation Age of Onset  . Hypertension Mother   . Hypertension Maternal Grandmother   . Hypertension Paternal Grandmother       Home Medications:   Prior to Admission medications   Medication Sig Start Date End Date Taking? Authorizing Provider  ibuprofen (ADVIL,MOTRIN) 200 MG tablet Take 600 mg by mouth every 6 (six) hours as needed.   Yes [provider]  insulin aspart protamine- aspart (  NOVOLOG MIX 70/30) (70-30) 100 UNIT/ML injection Inject 50 Units into the skin every other day.   Yes [provider]  clindamycin-benzoyl peroxide (BENZACLIN) gel Apply topically 2 (two) times daily. Patient not taking: Reported on 04/11/2017 12/19/16   Antonietta Breach, PA-C  doxycycline (VIBRAMYCIN) 100 MG capsule Take 1 capsule (100 mg total) by mouth 2 (two) times daily. Patient not taking: Reported on 04/11/2017 12/19/16   Antonietta Breach, PA-C  HYDROcodone-acetaminophen (NORCO) 5-325 MG tablet Take 1 tablet by mouth every 4 (four) hours as needed (coughing - use only at night). Patient not taking: Reported on 2/50/0370 05/28/86   Delora Fuel, MD  metFORMIN (GLUCOPHAGE) 500 MG tablet Take 1 tablet (500 mg total) by mouth 2 (two) times daily with a meal. Patient not taking: Reported on 11/04/9448 04/27/86   Delora Fuel, MD  promethazine (PHENERGAN) 25 MG tablet Take 1 tablet (25 mg total) by mouth every 6 (six) hours as needed for nausea or vomiting. Patient not taking: Reported on 02/28/2014 09/26/13 03/24/15  Al Corpus, PA-C     Allergies:    No Known Allergies   Physical Exam:   Vitals  Blood pressure (!) 155/98, pulse (!) 107, temperature 99 F (37.2 C), temperature source Oral,  resp. rate 18, height '6\' 1"'  (1.854 m), weight 108.3 kg (238 lb 11.2 oz), SpO2 100 %.   1. General  lying in bed in NAD,    2. Normal affect and insight, Not Suicidal or Homicidal, Awake Alert, Oriented X 3.  3. No F.N deficits, ALL C.Nerves Intact, Strength 5/5 all 4 extremities, Sensation intact all 4 extremities, Plantars down going.  4. Ears and Eyes appear Normal, Conjunctivae clear, PERRLA. Moist Oral Mucosa.  5. Supple Neck, No JVD, No cervical lymphadenopathy appriciated, No Carotid Bruits.  6. Symmetrical Chest wall movement, Good air movement bilaterally, CTAB.  7. Tachycardia s1, s2   8. Positive Bowel Sounds, Abdomen Soft, No tenderness, No organomegaly appriciated,No rebound -guarding or rigidity.  9.  No Cyanosis,  skin ulcer on the dorsum of the 2nd digit right foot, foul smelling,  And black on the tip of the 2nd digit right foot indicative of dry gangrene.  10. Good muscle tone,  joints appear normal , no effusions, Normal ROM.  11. No Palpable Lymph Nodes in Neck or Axillae     Data Review:    CBC Recent Labs  Lab 04/11/17 1705  WBC 11.3*  HGB 11.8*  HCT 36.2*  PLT 243  MCV 85.4  MCH 27.8  MCHC 32.6  RDW 13.8  LYMPHSABS 1.9  MONOABS 0.9  EOSABS 0.0  BASOSABS 0.0   ------------------------------------------------------------------------------------------------------------------  Chemistries  Recent Labs  Lab 04/11/17 1705  NA 135  K 4.2  CL 100*  CO2 25  GLUCOSE 351*  BUN 8  CREATININE 1.02  CALCIUM 8.4*  AST 12*  ALT 9*  ALKPHOS 100  BILITOT 0.8   ------------------------------------------------------------------------------------------------------------------ estimated creatinine clearance is 134.3 mL/min (by C-G formula based on SCr of 1.02 mg/dL). ------------------------------------------------------------------------------------------------------------------ No results for input(s): TSH, T4TOTAL, T3FREE, THYROIDAB in the last  72 hours.  Invalid input(s): FREET3  Coagulation profile Recent Labs  Lab 04/11/17 1705  INR 1.10   ------------------------------------------------------------------------------------------------------------------- No results for input(s): DDIMER in the last 72 hours. -------------------------------------------------------------------------------------------------------------------  Cardiac Enzymes No results for input(s): CKMB, TROPONINI, MYOGLOBIN in the last 168 hours.  Invalid input(s): CK ------------------------------------------------------------------------------------------------------------------ No results found for: BNP   ---------------------------------------------------------------------------------------------------------------  Urinalysis    Component Value Date/Time   COLORURINE  YELLOW 03/23/2015 2317   APPEARANCEUR CLOUDY (A) 03/23/2015 2317   LABSPEC 1.020 03/23/2015 2317   PHURINE 5.5 03/23/2015 2317   GLUCOSEU >1000 (A) 03/23/2015 2317   HGBUR NEGATIVE 03/23/2015 2317   BILIRUBINUR NEGATIVE 03/23/2015 2317   KETONESUR 15 (A) 03/23/2015 2317   PROTEINUR 100 (A) 03/23/2015 2317   NITRITE NEGATIVE 03/23/2015 2317   LEUKOCYTESUR NEGATIVE 03/23/2015 2317    ----------------------------------------------------------------------------------------------------------------   Imaging Results:    Dg Foot Complete Right  Result Date: 04/11/2017 CLINICAL DATA:  Pt has necrotic 2 toe on his right foot. PT states his toe nail fell of at the end of January, after that he notices some swelling and pain in toe. His toe became infected and he tried to treat at home. Pt is has fever chills and some nv. EXAM: RIGHT FOOT COMPLETE - 3+ VIEW COMPARISON:  None. FINDINGS: There is erosion of the tuft of the distal phalanx second digit back to the level of the proximal metaphysis. No subcutaneous gas clearly noted. IMPRESSION: Osteomyelitis of the distal phalanx second digit  Electronically Signed   By: Suzy Bouchard M.D.   On: 04/11/2017 17:23      Assessment & Plan:    Principal Problem:   Osteomyelitis ([place]) Active Problems:   Anemia   Diabetes (HCC)    Osteomyelitis Check ESR Check blood culture x 2 vanco iv, zosyn iv pharmacy to dose Tramadol prn pain I contacted orthopedics [facility] and they will by in Am likely will need amputation Orthopedics requested NPO after MN Appreciate orthopedic input.    Anemia Check cbc in am  Dm1 STOP metformin fsbs q4h, ISS  Severe protein calorie malnutrition Prostat   DVT Prophylaxis  - SCDs   AM Labs Ordered, also please review Full Orders  Family Communication: Admission, patients condition and plan of care including tests being ordered have been discussed with the patient who indicate understanding and agree with the plan and Code Status.  Code Status FULL CODE  Likely DC to  home  Condition GUARDED    Consults called: orthopedic consult    Admission status: inpatient  Time spent in minutes : 45   Jani Gravel M.D on 04/11/2017 at 9:35 PM  Between 7am to 7pm - Pager - [phone]. After 7pm go to [email or web address] - password [facility]  [facility] - Office  [phone]

## 2017-04-11 NOTE — Progress Notes (Addendum)
Pharmacy Antibiotic Note  Jerry RangSamuel Arnold is a 32 y.o. male admitted on 04/11/2017 with osteomyelitis.  Pharmacy has been consulted for vancomycin and Zosyn dosing.  Patient injured 2nd toe on L foot ~2-3 weeks ago- since Monday he has had increased pain, toe has become black and is sloughing off skin. Xray of foot shows osteomyelitis. SCr 1.02, nCrCl >13300mL/min.  Plan: Zosyn 3.375g IV q8h EI Vancomycin 2g IV x1 (already given), then 1g IV q8h based on obesity nomogram Follow c/s, clinical progression, surgical plans, renal function, level at SS  Height: 6\' 1"  (185.4 cm) Weight: 238 lb 11.2 oz (108.3 kg) IBW/kg (Calculated) : 79.9  Temp (24hrs), Avg:99.6 F (37.6 C), Min:99 F (37.2 C), Max:100.2 F (37.9 C)  Recent Labs  Lab 04/11/17 1705 04/11/17 1713  WBC 11.3*  --   CREATININE 1.02  --   LATICACIDVEN  --  1.12    Estimated Creatinine Clearance: 134.3 mL/min (by C-G formula based on SCr of 1.02 mg/dL).    No Known Allergies  Antimicrobials this admission: Vancomycin 2/21 >>  Zosyn 2/21 >>   Dose adjustments this admission: n/a  Microbiology results: 2/21 BCx:    Thank you for allowing pharmacy to be a part of this patient's care.  Dakiya Puopolo D. Elester Apodaca, PharmD, BCPS Clinical Pharmacist [phone] 04/11/2017 10:17 PM

## 2017-04-12 DIAGNOSIS — I96 Gangrene, not elsewhere classified: Secondary | ICD-10-CM

## 2017-04-12 DIAGNOSIS — M86171 Other acute osteomyelitis, right ankle and foot: Secondary | ICD-10-CM

## 2017-04-12 LAB — COMPREHENSIVE METABOLIC PANEL
ALT: 8 U/L — ABNORMAL LOW (ref 17–63)
AST: 10 U/L — ABNORMAL LOW (ref 15–41)
Albumin: 2.5 g/dL — ABNORMAL LOW (ref 3.5–5.0)
Alkaline Phosphatase: 86 U/L (ref 38–126)
Anion gap: 10 (ref 5–15)
BUN: 6 mg/dL (ref 6–20)
CO2: 24 mmol/L (ref 22–32)
Calcium: 8 mg/dL — ABNORMAL LOW (ref 8.9–10.3)
Chloride: 98 mmol/L — ABNORMAL LOW (ref 101–111)
Creatinine, Ser: 1 mg/dL (ref 0.61–1.24)
GFR calc Af Amer: >60 mL/min (ref >60)
GFR calc non Af Amer: >60 mL/min (ref >60)
Glucose, Bld: 270 mg/dL — ABNORMAL HIGH (ref 65–99)
Potassium: 3.7 mmol/L (ref 3.5–5.1)
Sodium: 132 mmol/L — ABNORMAL LOW (ref 135–145)
Total Bilirubin: 0.5 mg/dL (ref 0.3–1.2)
Total Protein: 7.1 g/dL (ref 6.5–8.1)

## 2017-04-12 LAB — GLUCOSE, CAPILLARY
Glucose-Capillary: 266 mg/dL — ABNORMAL HIGH (ref 65–99)
Glucose-Capillary: 189 mg/dL — ABNORMAL HIGH (ref 65–99)
Glucose-Capillary: 417 mg/dL — ABNORMAL HIGH (ref 65–99)
Glucose-Capillary: 159 mg/dL — ABNORMAL HIGH (ref 65–99)
Glucose-Capillary: 264 mg/dL — ABNORMAL HIGH (ref 65–99)
Glucose-Capillary: 285 mg/dL — ABNORMAL HIGH (ref 65–99)

## 2017-04-12 LAB — CBC
HCT: 34.2 % — ABNORMAL LOW (ref 39.0–52.0)
Hemoglobin: 11.2 g/dL — ABNORMAL LOW (ref 13.0–17.0)
MCH: 27.9 pg (ref 26.0–34.0)
MCHC: 32.7 g/dL (ref 30.0–36.0)
MCV: 85.1 fL (ref 78.0–100.0)
Platelets: 258 10*3/uL (ref 150–400)
RBC: 4.02 MIL/uL — ABNORMAL LOW (ref 4.22–5.81)
RDW: 13.6 % (ref 11.5–15.5)
WBC: 10.9 10*3/uL — ABNORMAL HIGH (ref 4.0–10.5)

## 2017-04-12 LAB — LIPID PANEL
Cholesterol: 154 mg/dL (ref 0–200)
HDL: 41 mg/dL (ref >40)
LDL Cholesterol: 95 mg/dL (ref 0–99)
Total CHOL/HDL Ratio: 3.8 RATIO
Triglycerides: 88 mg/dL (ref <150)
VLDL: 18 mg/dL (ref 0–40)

## 2017-04-12 LAB — SEDIMENTATION RATE: Sed Rate: 122 mm/hr — ABNORMAL HIGH (ref 0–16)

## 2017-04-12 LAB — HEMOGLOBIN A1C
Hgb A1c MFr Bld: 14 % — ABNORMAL HIGH (ref 4.8–5.6)
Mean Plasma Glucose: 355.1 mg/dL

## 2017-04-12 LAB — SURGICAL PCR SCREEN
MRSA, PCR: NEGATIVE
Staphylococcus aureus: POSITIVE — AB

## 2017-04-12 LAB — HIV ANTIBODY (ROUTINE TESTING W REFLEX): HIV Screen 4th Generation wRfx: NONREACTIVE

## 2017-04-12 MED ORDER — IBUPROFEN 400 MG PO TABS
400.0000 mg | ORAL_TABLET | Freq: Four times a day (QID) | ORAL | Status: DC | PRN
  Administered 2017-04-12 – 2017-04-13 (×2): 400 mg via ORAL
  Filled 2017-04-12 (×3): qty 1

## 2017-04-12 MED ORDER — INSULIN ASPART 100 UNIT/ML ~~LOC~~ SOLN
0.0000 [IU] | Freq: Three times a day (TID) | SUBCUTANEOUS | Status: DC
  Administered 2017-04-12: 4 [IU] via SUBCUTANEOUS
  Administered 2017-04-13: 7 [IU] via SUBCUTANEOUS
  Administered 2017-04-13: 4 [IU] via SUBCUTANEOUS
  Administered 2017-04-14: 7 [IU] via SUBCUTANEOUS
  Administered 2017-04-14: 4 [IU] via SUBCUTANEOUS
  Administered 2017-04-14: 11 [IU] via SUBCUTANEOUS
  Administered 2017-04-15 (×2): 3 [IU] via SUBCUTANEOUS
  Administered 2017-04-15 – 2017-04-16 (×2): 7 [IU] via SUBCUTANEOUS

## 2017-04-12 MED ORDER — CHLORHEXIDINE GLUCONATE 4 % EX LIQD
60.0000 mL | Freq: Once | CUTANEOUS | Status: AC
  Administered 2017-04-13: 4 via TOPICAL
  Filled 2017-04-12: qty 60

## 2017-04-12 MED ORDER — POVIDONE-IODINE 10 % EX SWAB
2.0000 "application " | Freq: Once | CUTANEOUS | Status: AC
  Administered 2017-04-13: 2 via TOPICAL

## 2017-04-12 MED ORDER — MORPHINE SULFATE (PF) 4 MG/ML IV SOLN
2.0000 mg | INTRAVENOUS | Status: DC | PRN
  Administered 2017-04-13: 2 mg via INTRAVENOUS
  Filled 2017-04-12: qty 1

## 2017-04-12 MED ORDER — INSULIN ASPART 100 UNIT/ML ~~LOC~~ SOLN
0.0000 [IU] | Freq: Every day | SUBCUTANEOUS | Status: DC
  Administered 2017-04-12 – 2017-04-13 (×2): 3 [IU] via SUBCUTANEOUS

## 2017-04-12 MED ORDER — HYDROCODONE-ACETAMINOPHEN 5-325 MG PO TABS
1.0000 | ORAL_TABLET | Freq: Four times a day (QID) | ORAL | Status: DC | PRN
  Administered 2017-04-12: 1 via ORAL
  Filled 2017-04-12 (×2): qty 1

## 2017-04-12 MED ORDER — INSULIN ASPART 100 UNIT/ML ~~LOC~~ SOLN
20.0000 [IU] | Freq: Once | SUBCUTANEOUS | Status: AC
  Administered 2017-04-12: 20 [IU] via SUBCUTANEOUS

## 2017-04-12 MED ORDER — INSULIN GLARGINE 100 UNIT/ML ~~LOC~~ SOLN
20.0000 [IU] | Freq: Every day | SUBCUTANEOUS | Status: DC
  Administered 2017-04-12: 20 [IU] via SUBCUTANEOUS
  Filled 2017-04-12: qty 0.2

## 2017-04-12 NOTE — Consult Note (Signed)
ORTHOPAEDIC CONSULTATION  REQUESTING PHYSICIAN: Standley BrookingGoodrich, Daniel P, MD  Chief Complaint: Abscess ulceration osteomyelitis right second toe   HPI: Jerry Arnold is a 32 y.o. male who presents with acute abscess ulceration and necrosis right foot second toe.  Patient has diabetic insensate neuropathy.  Past Medical History:  Diagnosis Date  . Osteomyelitis (HCC) 04/11/2017   with "second toe on right foot" injury x 2-3 weeks (04/11/2017)  . Type II diabetes mellitus (HCC)    Past Surgical History:  Procedure Laterality Date  . NO PAST SURGERIES     Social History   Socioeconomic History  . Marital status: Single    Spouse name: None  . Number of children: None  . Years of education: None  . Highest education level: None  Social Needs  . Financial resource strain: None  . Food insecurity - worry: None  . Food insecurity - inability: None  . Transportation needs - medical: None  . Transportation needs - non-medical: None  Occupational History  . None  Tobacco Use  . Smoking status: Never Smoker  . Smokeless tobacco: Never Used  Substance and Sexual Activity  . Alcohol use: Yes    Comment: 04/11/2017 "might have a few drinks/year"  . Drug use: No  . Sexual activity: None  Other Topics Concern  . None  Social History Narrative  . None   Family History  Problem Relation Age of Onset  . Hypertension Mother   . Hypertension Maternal Grandmother   . Hypertension Paternal Grandmother    - negative except otherwise stated in the family history section No Known Allergies Prior to Admission medications   Medication Sig Start Date End Date Taking? Authorizing Provider  ibuprofen (ADVIL,MOTRIN) 200 MG tablet Take 600 mg by mouth every 6 (six) hours as needed.   Yes [provider]  insulin aspart protamine- aspart (NOVOLOG MIX 70/30) (70-30) 100 UNIT/ML injection Inject 50 Units into the skin every other day.   Yes [provider]    clindamycin-benzoyl peroxide (BENZACLIN) gel Apply topically 2 (two) times daily. Patient not taking: Reported on 04/11/2017 12/19/16   Antony MaduraHumes, Kelly, PA-C  doxycycline (VIBRAMYCIN) 100 MG capsule Take 1 capsule (100 mg total) by mouth 2 (two) times daily. Patient not taking: Reported on 04/11/2017 12/19/16   Antony MaduraHumes, Kelly, PA-C  HYDROcodone-acetaminophen (NORCO) 5-325 MG tablet Take 1 tablet by mouth every 4 (four) hours as needed (coughing - use only at night). Patient not taking: Reported on 04/11/2017 03/24/15   Dione BoozeGlick, David, MD  metFORMIN (GLUCOPHAGE) 500 MG tablet Take 1 tablet (500 mg total) by mouth 2 (two) times daily with a meal. Patient not taking: Reported on 04/11/2017 03/24/15   Dione BoozeGlick, David, MD  promethazine (PHENERGAN) 25 MG tablet Take 1 tablet (25 mg total) by mouth every 6 (six) hours as needed for nausea or vomiting. Patient not taking: Reported on 02/28/2014 09/26/13 03/24/15  Oswaldo Conroyreech, Victoria, PA-C   Dg Foot Complete Right  Result Date: 04/11/2017 CLINICAL DATA:  Pt has necrotic 2 toe on his right foot. PT states his toe nail fell of at the end of January, after that he notices some swelling and pain in toe. His toe became infected and he tried to treat at home. Pt is has fever chills and some nv. EXAM: RIGHT FOOT COMPLETE - 3+ VIEW COMPARISON:  None. FINDINGS: There is erosion of the tuft of the distal phalanx second digit back to the level of the proximal metaphysis. No subcutaneous gas clearly  noted. IMPRESSION: Osteomyelitis of the distal phalanx second digit Electronically Signed   By: Genevive Bi M.D.   On: 04/11/2017 17:23   - pertinent xrays, CT, MRI studies were reviewed and independently interpreted  Positive ROS: All other systems have been reviewed and were otherwise negative with the exception of those mentioned in the HPI and as above.  Physical Exam: General: Alert, no acute distress Psychiatric: Patient is competent for consent with normal mood and  affect Lymphatic: No axillary or cervical lymphadenopathy Cardiovascular: No pedal edema Respiratory: No cyanosis, no use of accessory musculature GI: No organomegaly, abdomen is soft and non-tender  Skin: Patient has black ischemic changes to the second toe with exposed bone and tendon with ulceration encompassing approximately three quarters of the toe.  Images:  @ENCIMAGES @   Neurologic: Patient does not have protective sensation bilateral lower extremities.   MUSCULOSKELETAL:  Examination patient has a good dorsalis pedis and posterior tibial pulse he has no ascending cellulitis he does have swelling.  Patient has uncontrolled type 2 diabetes.  Assessment: Assessment diabetic insensate neuropathy with osteomyelitis ulceration and gangrenous changes right foot second toe.  Plan: Plan: We will plan for right foot second ray amputation tomorrow Saturday morning n.p.o. after midnight risk and benefits were discussed including risk of the wound not healing.  Discussed importance of strict nonweightbearing for 3-4 weeks.    Thank you for the consult and the opportunity to see [name], MD [facility] [phone] 4:39 PM

## 2017-04-12 NOTE — Progress Notes (Signed)
MD notified of patient requesting Ibuprofen for his headaches. He stated that is what works for him.

## 2017-04-12 NOTE — Progress Notes (Signed)
Inpatient Diabetes Program Recommendations  AACE/ADA: New Consensus Statement on Inpatient Glycemic Control (2015)  Target Ranges:  Prepandial:   less than 140 mg/dL      Peak postprandial:   less than 180 mg/dL (1-2 hours)      Critically ill patients:  140 - 180 mg/dL   Spoke with patient about diabetes and home regimen for diabetes control. Patient reports that he gets 70/30 insulin from over the counter and takes 40-50 units every other day. Discussed A1C results (14% this admission). Discussed glucose and A1C goals. Discussed importance of checking CBGs and maintaining good CBG control to prevent long-term and short-term complications. Explained how hyperglycemia leads to damage within blood vessels which lead to the common complications seen with uncontrolled diabetes. Discussed with patient that we will be monitoring glucose trends and will assess how  Much insulin his body requires and convert it to 70/30 insulin dosing for glucose control outpatient. Patient verbalized understanding of information discussed and he states that he has no further questions at this time related to diabetes.   Thanks,  Christena DeemShannon Delcia Spitzley RN, MSN, [facility] Inpatient Diabetes Coordinator Team Pager [phone] (8a-5p)

## 2017-04-12 NOTE — Progress Notes (Signed)
  PROGRESS NOTE  Jerry Arnold [id number] DOB: 10/05/1985 DOA: 04/11/2017 PCP: Patient, No Pcp Per  Brief Narrative: 31yom PMH DM presented with right oe pain, admitted for acute osteomyelitis and gangrene right second toe.  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 BC pending. - continue empiric abx - orthopedics plans amputation  DM uncontrolled, HgbA1c 14.0 - Lantus, SSI  DVT prophylaxis: SCDs Code Status: full Family Communication: none Disposition Plan: home    Brendia Sacksaniel Goodrich, MD  [facility] Direct contact: [phone] --Via amion app OR  --[email or web address]; password [email or web address]  7PM-7AM contact night coverage as above 04/12/2017, 10:25 AM  LOS: 1 day   Consultants:  Orthopedics   Procedures:    Antimicrobials:  Zosyn 2/21 >>  Vancomycin 2/21 >>  Interval history/Subjective: Nausea, vomiting. No abdominal pain.  Objective: Vitals:  Vitals:   04/12/17 0416 04/12/17 0500  BP:    Pulse:    Resp:    Temp: (!) 100.8 F (38.2 C) 98.8 F (37.1 C)  SpO2:      Exam:  Constitutional:  . Appears calm, mildly uncomfortable Eyes:  . pupils and irises appear normal . Normal lids  ENMT:  . grossly normal hearing  . Lips appear normal Respiratory:  . CTA bilaterally, no w/r/r.  . Respiratory effort normal Cardiovascular:  . RRR, no m/r/g . No LE extremity edema   DP 2+ bilaterally Abdomen:  . Soft, ntnd Musculoskeletal:  . Digits/nails RLE: cyanosis, gangrene right second toe Skin:  . Right second toe black with proximal edema and erythema Psychiatric:  . Mental status o Mood, affect appropriate . judgement and insight appear intact   I have personally reviewed the following:    Labs:  CBG 189-417  CMP noted  Lactic acid WNL  CBC noted  Hgb A1c 14  Imaging studies:  Right foot film osteomyelitis distal phalanx second digit  Scheduled Meds: .  feeding supplement (PRO-STAT SUGAR FREE 64)  30 mL Oral BID  . insulin aspart  0-9 Units Subcutaneous Q4H   Continuous Infusions: . sodium chloride 75 mL/hr at 04/11/17 2324  . piperacillin-tazobactam (ZOSYN)  IV Stopped (04/12/17 0930)  . vancomycin Stopped (04/12/17 0931)    Principal Problem:   Osteomyelitis (HCC) Active Problems:   Anemia   Diabetes (HCC)   LOS: 1 day

## 2017-04-12 NOTE — Progress Notes (Signed)
MD said to give 20 units and give the lantus. Waiting on order and for pharmacy to approve.

## 2017-04-12 NOTE — Consult Note (Addendum)
Reason for Consult:Toe ulcer Referring Physician: D Kayne Yuhas is an 32 y.o. male.  HPI: Jerry Arnold came into the hospital with fevers and worsening of a right second toe infection. He has had a wound there since January when he stubbed it and the nail came off. He's been treating it at home with betadine soaks and abx ointment. It started turning black on Monday. He has DM but does not treat it rigorously. He checks his CBG's 1-2x/week and they are usually in the 200's-300's.  Past Medical History:  Diagnosis Date  . Osteomyelitis ([place]) 04/11/2017   with "second toe on right foot" injury x 2-3 weeks (04/11/2017)  . Type II diabetes mellitus ([place])     Past Surgical History:  Procedure Laterality Date  . NO PAST SURGERIES      Family History  Problem Relation Age of Onset  . Hypertension Mother   . Hypertension Maternal Grandmother   . Hypertension Paternal Grandmother     Social History:  reports that  has never smoked. he has never used smokeless tobacco. He reports that he drinks alcohol. He reports that he does not use drugs.  Allergies: No Known Allergies  Medications: I have reviewed the patient's current medications.  Results for orders placed or performed during the hospital encounter of 04/11/17 (from the past 48 hour(s))  CBG monitoring, ED     Status: Abnormal   Collection Time: 04/11/17  4:58 PM  Result Value Ref Range   Glucose-Capillary 323 (H) 65 - 99 mg/dL   Comment 1 Notify RN    Comment 2 Document in Chart   Comprehensive metabolic panel     Status: Abnormal   Collection Time: 04/11/17  5:05 PM  Result Value Ref Range   Sodium 135 135 - 145 mmol/L   Potassium 4.2 3.5 - 5.1 mmol/L   Chloride 100 (L) 101 - 111 mmol/L   CO2 25 22 - 32 mmol/L   Glucose, Bld 351 (H) 65 - 99 mg/dL   BUN 8 6 - 20 mg/dL   Creatinine, Ser 1.02 0.61 - 1.24 mg/dL   Calcium 8.4 (L) 8.9 - 10.3 mg/dL   Total Protein 7.7 6.5 - 8.1 g/dL   Albumin 2.7 (L) 3.5 - 5.0 g/dL   AST 12 (L) 15 - 41 U/L   ALT 9 (L) 17 - 63 U/L   Alkaline Phosphatase 100 38 - 126 U/L   Total Bilirubin 0.8 0.3 - 1.2 mg/dL   GFR calc non Af Amer >60 >60 mL/min   GFR calc Af Amer >60 >60 mL/min    Comment: (NOTE) The eGFR has been calculated using the CKD EPI equation. This calculation has not been validated in all clinical situations. eGFR's persistently <60 mL/min signify possible Chronic Kidney Disease.    Anion gap 10 5 - 15    Comment: Performed at [facility] [address], [place], [place] [address]  CBC with Differential     Status: Abnormal   Collection Time: 04/11/17  5:05 PM  Result Value Ref Range   WBC 11.3 (H) 4.0 - 10.5 K/uL   RBC 4.24 4.22 - 5.81 MIL/uL   Hemoglobin 11.8 (L) 13.0 - 17.0 g/dL   HCT 36.2 (L) 39.0 - 52.0 %   MCV 85.4 78.0 - 100.0 fL   MCH 27.8 26.0 - 34.0 pg   MCHC 32.6 30.0 - 36.0 g/dL   RDW 13.8 11.5 - 15.5 %   Platelets 243 150 - 400  K/uL   Neutrophils Relative % 75 %   Lymphocytes Relative 17 %   Monocytes Relative 8 %   Eosinophils Relative 0 %   Basophils Relative 0 %   Neutro Abs 8.5 (H) 1.7 - 7.7 K/uL   Lymphs Abs 1.9 0.7 - 4.0 K/uL   Monocytes Absolute 0.9 0.1 - 1.0 K/uL   Eosinophils Absolute 0.0 0.0 - 0.7 K/uL   Basophils Absolute 0.0 0.0 - 0.1 K/uL   Smear Review MORPHOLOGY UNREMARKABLE     Comment: Performed at [facility] [address] [place], [place] [address]  Protime-INR     Status: None   Collection Time: 04/11/17  5:05 PM  Result Value Ref Range   Prothrombin Time 14.1 11.4 - 15.2 seconds   INR 1.10     Comment: Performed at [facility] [address], [place], [place] [address]  I-Stat CG4 Lactic Acid, ED     Status: None   Collection Time: 04/11/17  5:13 PM  Result Value Ref Range   Lactic Acid, Venous 1.12 0.5 - 1.9 mmol/L  Glucose, capillary     Status: Abnormal   Collection Time: 04/11/17 10:38 PM  Result Value Ref Range   Glucose-Capillary 208 (H) 65 - 99 mg/dL  Glucose, capillary      Status: Abnormal   Collection Time: 04/11/17 11:51 PM  Result Value Ref Range   Glucose-Capillary 245 (H) 65 - 99 mg/dL   Comment 1 Notify RN   Comprehensive metabolic panel     Status: Abnormal   Collection Time: 04/12/17 12:36 AM  Result Value Ref Range   Sodium 132 (L) 135 - 145 mmol/L   Potassium 3.7 3.5 - 5.1 mmol/L   Chloride 98 (L) 101 - 111 mmol/L   CO2 24 22 - 32 mmol/L   Glucose, Bld 270 (H) 65 - 99 mg/dL   BUN 6 6 - 20 mg/dL   Creatinine, Ser 1.00 0.61 - 1.24 mg/dL   Calcium 8.0 (L) 8.9 - 10.3 mg/dL   Total Protein 7.1 6.5 - 8.1 g/dL   Albumin 2.5 (L) 3.5 - 5.0 g/dL   AST 10 (L) 15 - 41 U/L   ALT 8 (L) 17 - 63 U/L   Alkaline Phosphatase 86 38 - 126 U/L   Total Bilirubin 0.5 0.3 - 1.2 mg/dL   GFR calc non Af Amer >60 >60 mL/min   GFR calc Af Amer >60 >60 mL/min    Comment: (NOTE) The eGFR has been calculated using the CKD EPI equation. This calculation has not been validated in all clinical situations. eGFR's persistently <60 mL/min signify possible Chronic Kidney Disease.    Anion gap 10 5 - 15    Comment: Performed at [facility] [address], [place], [place] [address]  CBC     Status: Abnormal   Collection Time: 04/12/17 12:36 AM  Result Value Ref Range   WBC 10.9 (H) 4.0 - 10.5 K/uL   RBC 4.02 (L) 4.22 - 5.81 MIL/uL   Hemoglobin 11.2 (L) 13.0 - 17.0 g/dL   HCT 34.2 (L) 39.0 - 52.0 %   MCV 85.1 78.0 - 100.0 fL   MCH 27.9 26.0 - 34.0 pg   MCHC 32.7 30.0 - 36.0 g/dL   RDW 13.6 11.5 - 15.5 %   Platelets 258 150 - 400 K/uL    Comment: Performed at [facility] [address] [place],  [address]  Sedimentation rate  Status: Abnormal   Collection Time: 04/12/17 12:36 AM  Result Value Ref Range   Sed Rate 122 (H) 0 - 16 mm/hr    Comment: Performed at [facility] [address], [place], [place] [address]  Hemoglobin A1c     Status: Abnormal   Collection Time: 04/12/17 12:36 AM  Result Value Ref Range   Hgb A1c MFr Bld  14.0 (H) 4.8 - 5.6 %    Comment: (NOTE) Pre diabetes:          5.7%-6.4% Diabetes:              >6.4% Glycemic control for   <7.0% adults with diabetes    Mean Plasma Glucose 355.1 mg/dL    Comment: Performed at [facility] [address], [place], [place] [address]  Lipid panel     Status: None   Collection Time: 04/12/17 12:36 AM  Result Value Ref Range   Cholesterol 154 0 - 200 mg/dL   Triglycerides 88 <150 mg/dL   HDL 41 >40 mg/dL   Total CHOL/HDL Ratio 3.8 RATIO   VLDL 18 0 - 40 mg/dL   LDL Cholesterol 95 0 - 99 mg/dL    Comment:        Total Cholesterol/HDL:CHD Risk Coronary Heart Disease Risk Table                     Men   Women  1/2 Average Risk   3.4   3.3  Average Risk       5.0   4.4  2 X Average Risk   9.6   7.1  3 X Average Risk  23.4   11.0        Use the calculated Patient Ratio above and the CHD Risk Table to determine the patient's CHD Risk.        ATP III CLASSIFICATION (LDL):  <100     mg/dL   Optimal  100-129  mg/dL   Near or Above                    Optimal  130-159  mg/dL   Borderline  160-189  mg/dL   High  >190     mg/dL   Very High Performed at [facility] [address], [place], [place] [address]   Surgical pcr screen     Status: Abnormal   Collection Time: 04/12/17  3:47 AM  Result Value Ref Range   MRSA, PCR NEGATIVE NEGATIVE   Staphylococcus aureus POSITIVE (A) NEGATIVE    Comment: (NOTE) The Xpert SA Assay (FDA approved for NASAL specimens in patients 68 years of age and older), is one component of a comprehensive surveillance program. It is not intended to diagnose infection nor to guide or monitor treatment. Performed at [facility], [place] [address], [place], [place] [address]   Glucose, capillary     Status: Abnormal   Collection Time: 04/12/17  4:05 AM  Result Value Ref Range   Glucose-Capillary 266 (H) 65 - 99 mg/dL  Glucose, capillary     Status: Abnormal   Collection Time: 04/12/17  8:13 AM  Result  Value Ref Range   Glucose-Capillary 189 (H) 65 - 99 mg/dL    Dg Foot Complete Right  Result Date: 04/11/2017 CLINICAL DATA:  Pt has necrotic 2 toe on his right foot. PT states his toe nail fell of at the end of January, after that he  notices some swelling and pain in toe. His toe became infected and he tried to treat at home. Pt is has fever chills and some nv. EXAM: RIGHT FOOT COMPLETE - 3+ VIEW COMPARISON:  None. FINDINGS: There is erosion of the tuft of the distal phalanx second digit back to the level of the proximal metaphysis. No subcutaneous gas clearly noted. IMPRESSION: Osteomyelitis of the distal phalanx second digit Electronically Signed   By: Suzy Bouchard M.D.   On: 04/11/2017 17:23    Review of Systems  Constitutional: Negative for weight loss.  HENT: Negative for ear discharge, ear pain, hearing loss and tinnitus.   Eyes: Negative for blurred vision, double vision, photophobia and pain.  Respiratory: Negative for cough, sputum production and shortness of breath.   Cardiovascular: Negative for chest pain.  Gastrointestinal: Negative for abdominal pain, nausea and vomiting.  Genitourinary: Negative for dysuria, flank pain, frequency and urgency.  Musculoskeletal: Negative for back pain, falls, joint pain, myalgias and neck pain.  Neurological: Negative for dizziness, tingling, sensory change, focal weakness, loss of consciousness and headaches.  Endo/Heme/Allergies: Does not bruise/bleed easily.  Psychiatric/Behavioral: Negative for depression, memory loss and substance abuse. The patient is not nervous/anxious.    Blood pressure 132/66, pulse (!) 108, temperature 98.8 F (37.1 C), temperature source Oral, resp. rate 18, height '6\' 1"'  (1.854 m), weight 108.1 kg (238 lb 6.4 oz), SpO2 97 %. Physical Exam  Constitutional: He appears well-developed and well-nourished. No distress.  HENT:  Head: Normocephalic and atraumatic.  Eyes: Conjunctivae are normal. Right eye exhibits no  discharge. Left eye exhibits no discharge. No scleral icterus.  Neck: Normal range of motion.  Cardiovascular: Normal rate and regular rhythm.  Respiratory: Effort normal. No respiratory distress.  Musculoskeletal:  RLE 2nd toe necrotic with deep dorsal ulceration and malodorous, no ecchymosis or rash  Nontender  No knee or ankle effusion  Knee stable to varus/ valgus and anterior/posterior stress  Sens DPN, SPN, TN intact  Motor EHL, ext, flex, evers 5/5  DP 2+, PT 0, No significant edema  LLE No traumatic wounds, ecchymosis, or rash  Nontender  No knee or ankle effusion  Knee stable to varus/ valgus and anterior/posterior stress  Sens DPN, SPN, TN intact  Motor EHL, ext, flex, evers 5/5  DP 2+, PT 0, No significant edema  Neurological: He is alert.  Skin: Skin is warm and dry. He is not diaphoretic.  Psychiatric: He has a normal mood and affect. His behavior is normal.    Assessment/Plan: Right 2nd toe gangrene with ulceration and distal phalanx osteomyelitis -- Will need amputation for this. Timing TBD. Dr. Sharol Given to evaluate later today but no room to perform today, will give diet. DM -- Reinforced need for strict glucose control and foot surveillance    Lisette Abu, PA-C Orthopedic Surgery [phone] 04/12/2017, 9:01 AM

## 2017-04-12 NOTE — Progress Notes (Signed)
MD was paged about patient's blood sugar being 417. Patient had an [place] ice and a peach cup when he first had his diet changed due to him feeling sick and vomiting up all morning.

## 2017-04-12 NOTE — Progress Notes (Signed)
Dressing on right second toe was changed.

## 2017-04-12 NOTE — H&P (View-Only) (Signed)
ORTHOPAEDIC CONSULTATION  REQUESTING PHYSICIAN: Standley BrookingGoodrich, Jerry P, MD  Chief Complaint: Abscess ulceration osteomyelitis right second toe   HPI: Jerry Arnold is a 32 y.o. male who presents with acute abscess ulceration and necrosis right foot second toe.  Patient has diabetic insensate neuropathy.  Past Medical History:  Diagnosis Date  . Osteomyelitis (HCC) 04/11/2017   with "second toe on right foot" injury x 2-3 weeks (04/11/2017)  . Type II diabetes mellitus (HCC)    Past Surgical History:  Procedure Laterality Date  . NO PAST SURGERIES     Social History   Socioeconomic History  . Marital status: Single    Spouse name: None  . Number of children: None  . Years of education: None  . Highest education level: None  Social Needs  . Financial resource strain: None  . Food insecurity - worry: None  . Food insecurity - inability: None  . Transportation needs - medical: None  . Transportation needs - non-medical: None  Occupational History  . None  Tobacco Use  . Smoking status: Never Smoker  . Smokeless tobacco: Never Used  Substance and Sexual Activity  . Alcohol use: Yes    Comment: 04/11/2017 "might have a few drinks/year"  . Drug use: No  . Sexual activity: None  Other Topics Concern  . None  Social History Narrative  . None   Family History  Problem Relation Age of Onset  . Hypertension Mother   . Hypertension Maternal Grandmother   . Hypertension Paternal Grandmother    - negative except otherwise stated in the family history section No Known Allergies Prior to Admission medications   Medication Sig Start Date End Date Taking? Authorizing Provider  ibuprofen (ADVIL,MOTRIN) 200 MG tablet Take 600 mg by mouth every 6 (six) hours as needed.   Yes [provider]  insulin aspart protamine- aspart (NOVOLOG MIX 70/30) (70-30) 100 UNIT/ML injection Inject 50 Units into the skin every other day.   Yes [provider]    clindamycin-benzoyl peroxide (BENZACLIN) gel Apply topically 2 (two) times daily. Patient not taking: Reported on 04/11/2017 12/19/16   Antony MaduraHumes, Kelly, PA-C  doxycycline (VIBRAMYCIN) 100 MG capsule Take 1 capsule (100 mg total) by mouth 2 (two) times daily. Patient not taking: Reported on 04/11/2017 12/19/16   Antony MaduraHumes, Kelly, PA-C  HYDROcodone-acetaminophen (NORCO) 5-325 MG tablet Take 1 tablet by mouth every 4 (four) hours as needed (coughing - use only at night). Patient not taking: Reported on 04/11/2017 03/24/15   Dione BoozeGlick, David, MD  metFORMIN (GLUCOPHAGE) 500 MG tablet Take 1 tablet (500 mg total) by mouth 2 (two) times daily with a meal. Patient not taking: Reported on 04/11/2017 03/24/15   Dione BoozeGlick, David, MD  promethazine (PHENERGAN) 25 MG tablet Take 1 tablet (25 mg total) by mouth every 6 (six) hours as needed for nausea or vomiting. Patient not taking: Reported on 02/28/2014 09/26/13 03/24/15  Oswaldo Conroyreech, Victoria, PA-C   Dg Foot Complete Right  Result Date: 04/11/2017 CLINICAL DATA:  Pt has necrotic 2 toe on his right foot. PT states his toe nail fell of at the end of January, after that he notices some swelling and pain in toe. His toe became infected and he tried to treat at home. Pt is has fever chills and some nv. EXAM: RIGHT FOOT COMPLETE - 3+ VIEW COMPARISON:  None. FINDINGS: There is erosion of the tuft of the distal phalanx second digit back to the level of the proximal metaphysis. No subcutaneous gas clearly  noted. IMPRESSION: Osteomyelitis of the distal phalanx second digit Electronically Signed   By: Genevive Bi M.D.   On: 04/11/2017 17:23   - pertinent xrays, CT, MRI studies were reviewed and independently interpreted  Positive ROS: All other systems have been reviewed and were otherwise negative with the exception of those mentioned in the HPI and as above.  Physical Exam: General: Alert, no acute distress Psychiatric: Patient is competent for consent with normal mood and  affect Lymphatic: No axillary or cervical lymphadenopathy Cardiovascular: No pedal edema Respiratory: No cyanosis, no use of accessory musculature GI: No organomegaly, abdomen is soft and non-tender  Skin: Patient has black ischemic changes to the second toe with exposed bone and tendon with ulceration encompassing approximately three quarters of the toe.  Images:  @ENCIMAGES @   Neurologic: Patient does not have protective sensation bilateral lower extremities.   MUSCULOSKELETAL:  Examination patient has a good dorsalis pedis and posterior tibial pulse he has no ascending cellulitis he does have swelling.  Patient has uncontrolled type 2 diabetes.  Assessment: Assessment diabetic insensate neuropathy with osteomyelitis ulceration and gangrenous changes right foot second toe.  Plan: Plan: We will plan for right foot second ray amputation tomorrow Saturday morning n.p.o. after midnight risk and benefits were discussed including risk of the wound not healing.  Discussed importance of strict nonweightbearing for 3-4 weeks.    Thank you for the consult and the opportunity to see [name], MD [facility] [phone] 4:39 PM

## 2017-04-12 NOTE — Progress Notes (Signed)
Dressing on second right toe was changed.

## 2017-04-13 ENCOUNTER — Encounter (HOSPITAL_COMMUNITY)
Admission: EM | Disposition: A | Discharge status: Home / Self Care | Payer: Self-pay | Source: Home / Self Care | Attending: Family Medicine

## 2017-04-13 ENCOUNTER — Inpatient Hospital Stay (HOSPITAL_COMMUNITY): Payer: Self-pay | Admitting: Certified Registered"

## 2017-04-13 DIAGNOSIS — L03119 Cellulitis of unspecified part of limb: Secondary | ICD-10-CM

## 2017-04-13 HISTORY — PX: AMPUTATION: SHX166

## 2017-04-13 LAB — GLUCOSE, CAPILLARY
Glucose-Capillary: 208 mg/dL — ABNORMAL HIGH (ref 65–99)
Glucose-Capillary: 202 mg/dL — ABNORMAL HIGH (ref 65–99)
Glucose-Capillary: 218 mg/dL — ABNORMAL HIGH (ref 65–99)
Glucose-Capillary: 197 mg/dL — ABNORMAL HIGH (ref 65–99)
Glucose-Capillary: 276 mg/dL — ABNORMAL HIGH (ref 65–99)

## 2017-04-13 SURGERY — AMPUTATION, FOOT, RAY
Anesthesia: General | Site: Foot | Laterality: Right

## 2017-04-13 MED ORDER — ACETAMINOPHEN 325 MG PO TABS: 650.0000 mg | ORAL_TABLET | ORAL | Status: DC | PRN

## 2017-04-13 MED ORDER — OXYCODONE HCL 5 MG PO TABS: 5.0000 mg | ORAL_TABLET | Freq: Once | ORAL | Status: DC | PRN

## 2017-04-13 MED ORDER — DOCUSATE SODIUM 100 MG PO CAPS: 100.0000 mg | ORAL_CAPSULE | Freq: Two times a day (BID) | ORAL | Status: DC

## 2017-04-13 MED ORDER — SODIUM CHLORIDE 0.9 % IV SOLN
INTRAVENOUS | Status: DC | PRN
  Administered 2017-04-13: 08:00:00 via INTRAVENOUS

## 2017-04-13 MED ORDER — INSULIN GLARGINE 100 UNIT/ML ~~LOC~~ SOLN
20.0000 [IU] | Freq: Every day | SUBCUTANEOUS | Status: DC
  Administered 2017-04-13 – 2017-04-14 (×2): 20 [IU] via SUBCUTANEOUS
  Filled 2017-04-13 (×2): qty 0.2

## 2017-04-13 MED ORDER — MIDAZOLAM HCL 2 MG/2ML IJ SOLN
INTRAMUSCULAR | Status: AC
  Filled 2017-04-13: qty 2

## 2017-04-13 MED ORDER — PROPOFOL 10 MG/ML IV BOLUS
INTRAVENOUS | Status: AC
  Filled 2017-04-13: qty 20

## 2017-04-13 MED ORDER — BISACODYL 10 MG RE SUPP: 10.0000 mg | Freq: Every day | RECTAL | Status: DC | PRN

## 2017-04-13 MED ORDER — ONDANSETRON HCL 4 MG/2ML IJ SOLN: 4.0000 mg | Freq: Four times a day (QID) | INTRAMUSCULAR | Status: DC | PRN

## 2017-04-13 MED ORDER — HYDROMORPHONE HCL 1 MG/ML IJ SOLN
1.0000 mg | INTRAMUSCULAR | Status: DC | PRN
  Administered 2017-04-14: 1 mg via INTRAVENOUS
  Filled 2017-04-13 (×2): qty 1

## 2017-04-13 MED ORDER — MIDAZOLAM HCL 2 MG/2ML IJ SOLN
INTRAMUSCULAR | Status: DC | PRN
  Administered 2017-04-13: 2 mg via INTRAVENOUS

## 2017-04-13 MED ORDER — SODIUM CHLORIDE 0.9 % IV SOLN
INTRAVENOUS | Status: DC
  Administered 2017-04-13: 13:00:00 via INTRAVENOUS

## 2017-04-13 MED ORDER — ONDANSETRON HCL 4 MG/2ML IJ SOLN
INTRAMUSCULAR | Status: AC
  Filled 2017-04-13: qty 2

## 2017-04-13 MED ORDER — ONDANSETRON HCL 4 MG/2ML IJ SOLN
4.0000 mg | Freq: Four times a day (QID) | INTRAMUSCULAR | Status: DC | PRN
  Administered 2017-04-13 – 2017-04-16 (×3): 4 mg via INTRAVENOUS
  Filled 2017-04-13 (×3): qty 2

## 2017-04-13 MED ORDER — POLYETHYLENE GLYCOL 3350 17 G PO PACK
17.0000 g | PACK | Freq: Two times a day (BID) | ORAL | Status: DC
  Administered 2017-04-17: 17 g via ORAL
  Filled 2017-04-13 (×5): qty 1

## 2017-04-13 MED ORDER — POLYETHYLENE GLYCOL 3350 17 G PO PACK: 17.0000 g | PACK | Freq: Every day | ORAL | Status: DC | PRN

## 2017-04-13 MED ORDER — ACETAMINOPHEN 650 MG RE SUPP: 650.0000 mg | RECTAL | Status: DC | PRN

## 2017-04-13 MED ORDER — METOCLOPRAMIDE HCL 10 MG PO TABS: 5.0000 mg | ORAL_TABLET | Freq: Three times a day (TID) | ORAL | Status: DC | PRN

## 2017-04-13 MED ORDER — ONDANSETRON HCL 4 MG PO TABS: 4.0000 mg | ORAL_TABLET | Freq: Four times a day (QID) | ORAL | Status: DC | PRN

## 2017-04-13 MED ORDER — MAGNESIUM CITRATE PO SOLN
1.0000 | Freq: Once | ORAL | Status: AC | PRN
  Administered 2017-04-15: 1 via ORAL
  Filled 2017-04-13: qty 296

## 2017-04-13 MED ORDER — ONDANSETRON HCL 4 MG/2ML IJ SOLN
INTRAMUSCULAR | Status: DC | PRN
  Administered 2017-04-13: 4 mg via INTRAVENOUS

## 2017-04-13 MED ORDER — OXYCODONE HCL 5 MG/5ML PO SOLN: 5.0000 mg | Freq: Once | ORAL | Status: DC | PRN

## 2017-04-13 MED ORDER — LIDOCAINE 2% (20 MG/ML) 5 ML SYRINGE
INTRAMUSCULAR | Status: DC | PRN
  Administered 2017-04-13: 60 mg via INTRAVENOUS

## 2017-04-13 MED ORDER — METHOCARBAMOL 500 MG PO TABS
ORAL_TABLET | ORAL | Status: AC
  Filled 2017-04-13: qty 1

## 2017-04-13 MED ORDER — METOCLOPRAMIDE HCL 5 MG/ML IJ SOLN: 5.0000 mg | Freq: Three times a day (TID) | INTRAMUSCULAR | Status: DC | PRN

## 2017-04-13 MED ORDER — METHOCARBAMOL 500 MG PO TABS
500.0000 mg | ORAL_TABLET | Freq: Four times a day (QID) | ORAL | Status: DC | PRN
  Administered 2017-04-13: 500 mg via ORAL
  Filled 2017-04-13 (×2): qty 1

## 2017-04-13 MED ORDER — PROPOFOL 10 MG/ML IV BOLUS
INTRAVENOUS | Status: AC
Start: 2017-04-13 — End: 2017-04-13
  Filled 2017-04-13: qty 20

## 2017-04-13 MED ORDER — FENTANYL CITRATE (PF) 250 MCG/5ML IJ SOLN
INTRAMUSCULAR | Status: DC | PRN
  Administered 2017-04-13: 50 ug via INTRAVENOUS

## 2017-04-13 MED ORDER — OXYCODONE HCL 5 MG PO TABS: 5.0000 mg | ORAL_TABLET | ORAL | Status: DC | PRN

## 2017-04-13 MED ORDER — PROPOFOL 10 MG/ML IV BOLUS
INTRAVENOUS | Status: DC | PRN
  Administered 2017-04-13: 200 mg via INTRAVENOUS

## 2017-04-13 MED ORDER — OXYCODONE HCL 5 MG PO TABS
10.0000 mg | ORAL_TABLET | ORAL | Status: DC | PRN
  Administered 2017-04-14: 10 mg via ORAL
  Filled 2017-04-13: qty 2

## 2017-04-13 MED ORDER — CEFAZOLIN SODIUM-DEXTROSE 2-4 GM/100ML-% IV SOLN: 2.0000 g | INTRAVENOUS | Status: AC

## 2017-04-13 MED ORDER — FENTANYL CITRATE (PF) 100 MCG/2ML IJ SOLN
25.0000 ug | INTRAMUSCULAR | Status: DC | PRN
  Administered 2017-04-13 (×2): 50 ug via INTRAVENOUS

## 2017-04-13 MED ORDER — FENTANYL CITRATE (PF) 100 MCG/2ML IJ SOLN
INTRAMUSCULAR | Status: AC
  Administered 2017-04-13: 50 ug via INTRAVENOUS
  Filled 2017-04-13: qty 2

## 2017-04-13 MED ORDER — FENTANYL CITRATE (PF) 250 MCG/5ML IJ SOLN
INTRAMUSCULAR | Status: AC
  Filled 2017-04-13: qty 5

## 2017-04-13 MED ORDER — METHOCARBAMOL 1000 MG/10ML IJ SOLN
500.0000 mg | Freq: Four times a day (QID) | INTRAVENOUS | Status: DC | PRN
  Filled 2017-04-13: qty 5

## 2017-04-13 MED ORDER — 0.9 % SODIUM CHLORIDE (POUR BTL) OPTIME
TOPICAL | Status: DC | PRN
  Administered 2017-04-13: 1000 mL

## 2017-04-13 MED ORDER — LIDOCAINE 2% (20 MG/ML) 5 ML SYRINGE
INTRAMUSCULAR | Status: AC
  Filled 2017-04-13: qty 5

## 2017-04-13 SURGICAL SUPPLY — 27 items
BLADE SURG 21 STRL SS (BLADE) ×2 IMPLANT
BNDG COHESIVE 4X5 TAN STRL (GAUZE/BANDAGES/DRESSINGS) ×2 IMPLANT
BNDG GAUZE ELAST 4 BULKY (GAUZE/BANDAGES/DRESSINGS) ×2 IMPLANT
COVER SURGICAL LIGHT HANDLE (MISCELLANEOUS) ×2 IMPLANT
DRAPE U-SHAPE 47X51 STRL (DRAPES) ×2 IMPLANT
DRSG ADAPTIC 3X8 NADH LF (GAUZE/BANDAGES/DRESSINGS) ×2 IMPLANT
DURAPREP 26ML APPLICATOR (WOUND CARE) ×2 IMPLANT
ELECT REM PT RETURN 9FT ADLT (ELECTROSURGICAL) ×2
ELECTRODE REM PT RTRN 9FT ADLT (ELECTROSURGICAL) ×1 IMPLANT
GAUZE SPONGE 4X4 12PLY STRL (GAUZE/BANDAGES/DRESSINGS) ×2 IMPLANT
GLOVE BIOGEL PI IND STRL 9 (GLOVE) ×1 IMPLANT
GLOVE BIOGEL PI INDICATOR 9 (GLOVE) ×1
GLOVE SURG ORTHO 9.0 STRL STRW (GLOVE) ×2 IMPLANT
GOWN STRL REUS W/ TWL XL LVL3 (GOWN DISPOSABLE) ×1 IMPLANT
GOWN STRL REUS W/TWL XL LVL3 (GOWN DISPOSABLE) ×1
KIT BASIN OR (CUSTOM PROCEDURE TRAY) ×2 IMPLANT
KIT ROOM TURNOVER OR (KITS) ×2 IMPLANT
NS IRRIG 1000ML POUR BTL (IV SOLUTION) ×2 IMPLANT
PACK ORTHO EXTREMITY (CUSTOM PROCEDURE TRAY) ×2 IMPLANT
PAD ABD 8X10 STRL (GAUZE/BANDAGES/DRESSINGS) ×2 IMPLANT
PAD ARMBOARD 7.5X6 YLW CONV (MISCELLANEOUS) ×2 IMPLANT
SUT ETHILON 2 0 PSLX (SUTURE) ×2 IMPLANT
SWAB COLLECTION DEVICE MRSA (MISCELLANEOUS) ×2 IMPLANT
SWAB CULTURE ESWAB REG 1ML (MISCELLANEOUS) ×2 IMPLANT
TOWEL OR 17X26 10 PK STRL BLUE (TOWEL DISPOSABLE) ×2 IMPLANT
TUBE CONNECTING 12X1/4 (SUCTIONS) ×2 IMPLANT
YANKAUER SUCT BULB TIP NO VENT (SUCTIONS) ×2 IMPLANT

## 2017-04-13 NOTE — Transfer of Care (Signed)
Immediate Anesthesia Transfer of Care Note  Patient: Carren RangSamuel Fulginiti  Procedure(s) Performed: SECOND FOOT RAY AMPUTATION (Right Foot)  Patient Location: PACU  Anesthesia Type:General  Level of Consciousness: awake, alert  and oriented  Airway & Oxygen Therapy: Patient Spontanous Breathing and Patient connected to nasal cannula oxygen  Post-op Assessment: Report given to RN and Post -op Vital signs reviewed and stable  Post vital signs: Reviewed and stable  Last Vitals:  Vitals:   04/12/17 2004 04/13/17 0604  BP: (!) 141/81 (!) 151/98  Pulse: 93 86  Resp: 18 16  Temp: 37.1 C 36.8 C  SpO2: 98% 98%    Last Pain:  Vitals:   04/13/17 0604  TempSrc: Oral  PainSc:       Patients Stated Pain Goal: 4 (04/13/17 0145)  Complications: No apparent anesthesia complications

## 2017-04-13 NOTE — Progress Notes (Signed)
Entered pt room to find pt vomiting into emesis bag. Pt unable to keep any dinner down. Advised pt to choose bland foods and broth for the time being and administered PRN zofran. Pt refuses any pain meds at this time.

## 2017-04-13 NOTE — Anesthesia Postprocedure Evaluation (Signed)
Anesthesia Post Note  Patient: Jerry Arnold  Procedure(s) Performed: SECOND FOOT RAY AMPUTATION (Right Foot)     Patient location during evaluation: PACU Anesthesia Type: General Level of consciousness: awake and alert Pain management: pain level controlled Vital Signs Assessment: post-procedure vital signs reviewed and stable Respiratory status: spontaneous breathing, nonlabored ventilation, respiratory function stable and patient connected to nasal cannula oxygen Cardiovascular status: blood pressure returned to baseline and stable Postop Assessment: no apparent nausea or vomiting Anesthetic complications: no    Last Vitals:  Vitals:   04/13/17 1330 04/13/17 1526  BP:  133/81  Pulse:  (!) 101  Resp:  19  Temp: (!) 39.4 C 37.4 C  SpO2:  97%    Last Pain:  Vitals:   04/13/17 1526  TempSrc: Oral  PainSc:                  Oreatha Fabry S

## 2017-04-13 NOTE — Interval H&P Note (Signed)
History and Physical Interval Note:  04/13/2017 7:42 AM  Jerry Arnold  has presented today for surgery, with the diagnosis of osteomyelitits  The various methods of treatment have been discussed with the patient and family. After consideration of risks, benefits and other options for treatment, the patient has consented to  Procedure(s): SECOND FOOT RAY AMPUTATION (Right) as a surgical intervention .  The patient's history has been reviewed, patient examined, no change in status, stable for surgery.  I have reviewed the patient's chart and labs.  Questions were answered to the patient's satisfaction.     Nadara MustardMarcus V Eiliana Drone

## 2017-04-13 NOTE — Progress Notes (Signed)
Moving arm- will retake

## 2017-04-13 NOTE — Progress Notes (Signed)
Cell phone picked up by 5W RN

## 2017-04-13 NOTE — Progress Notes (Signed)
Pt. Has fever of 103.  Md Notified.  Continue Antibiotics and Blood Cultures X2.

## 2017-04-13 NOTE — Progress Notes (Signed)
Pt. Temp down ton 99.4 and reports feeling a lot better.  No nausea, refused pain meds.

## 2017-04-13 NOTE — Anesthesia Procedure Notes (Signed)
Procedure Name: LMA Insertion Date/Time: 04/13/2017 7:50 AM Performed by: Elliot DallyHuggins, Shanti Eichel, CRNA Pre-anesthesia Checklist: Patient identified, Emergency Drugs available, Suction available and Patient being monitored Patient Re-evaluated:Patient Re-evaluated prior to induction Oxygen Delivery Method: Circle System Utilized Preoxygenation: Pre-oxygenation with 100% oxygen Induction Type: IV induction Ventilation: Mask ventilation without difficulty LMA: LMA inserted LMA Size: 5.0 Number of attempts: 1 Airway Equipment and Method: Bite block Placement Confirmation: positive ETCO2 Tube secured with: Tape Dental Injury: Teeth and Oropharynx as per pre-operative assessment

## 2017-04-13 NOTE — Op Note (Signed)
04/13/2017  8:35 AM  PATIENT:  Jerry Arnold    PRE-OPERATIVE DIAGNOSIS:  osteomyelitis ulceration right foot second toe  POST-OPERATIVE DIAGNOSIS: Osteomyelitis ulceration right foot second toe with abscess around the MTP joint.  PROCEDURE:  SECOND FOOT RAY AMPUTATION  SURGEON:  Nadara MustardMarcus V Kemaria Dedic, MD  PHYSICIAN ASSISTANT:None ANESTHESIA:   General  PREOPERATIVE INDICATIONS:  Jerry Arnold is a  32 y.o. male with a diagnosis of osteomyelitis ulceration right foot second toe who failed conservative measures and elected for surgical management.    The risks benefits and alternatives were discussed with the patient preoperatively including but not limited to the risks of infection, bleeding, nerve injury, cardiopulmonary complications, the need for revision surgery, among others, and the patient was willing to proceed.  OPERATIVE IMPLANTS: None  @ENCIMAGES @  OPERATIVE FINDINGS: Abscess around the MTP joint cultures were sent x2  OPERATIVE PROCEDURE: Patient was brought the operating room and underwent a general anesthetic.  After adequate levels of anesthesia were obtained patient's right lower extremity was prepped using DuraPrep draped into a sterile field a timeout was called.  A V incision was made dorsally over the second ray.  The second ray was resected through the midshaft the toe and metatarsal were resected in one block of tissue.  Patient had a deep abscess around the MTP joint and further debridement was performed to debride back to healthy viable tissue.  Cultures were sent the wound was irrigated with normal saline there was petechial bleeding in the soft tissue.  The incision was closed using 2-0 nylon.  A sterile dressing was applied patient was extubated taken to the PACU in stable condition.   DISCHARGE PLANNING:  Antibiotic duration: Continue antibiotics for 3 days postoperatively.  Weightbearing: Nonweightbearing on the right.  Pain medication: As  ordered.  Dressing care/ Wound VAC: Dry dressing to remain in place until discharge.  Ambulatory devices: Walker or crutches.  Discharge to: Home.  Follow-up: In the office 1 week post operative.

## 2017-04-13 NOTE — Anesthesia Preprocedure Evaluation (Signed)
Anesthesia Evaluation  Patient identified by MRN, date of birth, ID band Patient awake    Reviewed: Allergy & Precautions, H&P , NPO status , Patient's Chart, lab work & pertinent test results  Airway Mallampati: II   Neck ROM: full    Dental   Pulmonary neg pulmonary ROS,    breath sounds clear to auscultation       Cardiovascular negative cardio ROS   Rhythm:regular Rate:Normal     Neuro/Psych    GI/Hepatic   Endo/Other  diabetes, Type 2, Insulin Dependentobese  Renal/GU      Musculoskeletal   Abdominal   Peds  Hematology  (+) anemia ,   Anesthesia Other Findings   Reproductive/Obstetrics                             Anesthesia Physical Anesthesia Plan  ASA: II  Anesthesia Plan: General   Post-op Pain Management:    Induction: Intravenous  PONV Risk Score and Plan: 2 and Ondansetron, Midazolam and Treatment may vary due to age or medical condition  Airway Management Planned: LMA  Additional Equipment:   Intra-op Plan:   Post-operative Plan:   Informed Consent: I have reviewed the patients History and Physical, chart, labs and discussed the procedure including the risks, benefits and alternatives for the proposed anesthesia with the patient or authorized representative who has indicated his/her understanding and acceptance.     Plan Discussed with: CRNA, Anesthesiologist and Surgeon  Anesthesia Plan Comments:         Anesthesia Quick Evaluation

## 2017-04-13 NOTE — Evaluation (Signed)
Physical Therapy Evaluation Patient Details Name: Jerry Arnold MRN: [id number] DOB: [birth year] Today's Date: 04/13/2017   History of Present Illness  31yom PMH DM presented with right oe pain, admitted for acute osteomyelitis and gangrene right second toe. 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 PTA, patient independent with all mobility, wishing to recover at his aunts house after d/c with level entry and PRN support. Upon eval patient presents with moderate pain, and minor balance deficits that limit his safe mobility. Ambulating in hallway with RW and min guard at this time. One more PT session to ensure safe transfers has pt had 1 LOB this evening and he could use the practice with adhering to WB status before leaving. Bring knee walker to session as he is interested in trying and acquiring one for longer distance ambulation.  Patient will benefit from skilled PT to increase their independence and safety with mobility to allow discharge to the venue listed below.       Follow Up Recommendations No PT follow up;Supervision for mobility/OOB    Equipment Recommendations  Rolling walker with 5" wheels(Knee Walker)    Recommendations for Other Services       Precautions / Restrictions Restrictions Weight Bearing Restrictions: Yes RLE Weight Bearing: Non weight bearing      Mobility  Bed Mobility Overal bed mobility: Modified Independent                Transfers Overall transfer level: Needs assistance Equipment used: Rolling walker (2 wheeled) Transfers: Sit to/from Stand Sit to Stand: Supervision         General transfer comment: supervision cues for hand placement. Pt had 1 lob with SPT this evening requiring min A to stabilize.   Ambulation/Gait Ambulation/Gait assistance: Min guard;Supervision Ambulation Distance (Feet): 50  Feet Assistive device: Rolling walker (2 wheeled) Gait Pattern/deviations: Step-to pattern Gait velocity: decreased   General Gait Details: patient with hop to gait in RW, adherence to NWB status. Fatigues quickly in RW.   Stairs            Wheelchair Mobility    Modified Rankin (Stroke Patients Only)       Balance Overall balance assessment: No apparent balance deficits (not formally assessed)(Reliant on UE support for SLS static and dynamic balance.)                                           Pertinent Vitals/Pain Pain Assessment: 0-10 Pain Score: 7  Pain Location: R foot Pain Descriptors / Indicators: Discomfort;Grimacing Pain Intervention(s): Limited activity within patient's tolerance;Monitored during session;Premedicated before session;Repositioned    Home Living Family/patient expects to be discharged to:: Private residence Living Arrangements: Other relatives Available Help at Discharge: Available PRN/intermittently;Family(will be staying at Aunts house while he recovers ) Type of Home: House Home Access: Level entry     Home Layout: One level Home Equipment: None      Prior Function Level of Independence: Independent               Hand Dominance        Extremity/Trunk Assessment   Upper Extremity Assessment Upper Extremity Assessment: Overall WFL for tasks assessed    Lower Extremity Assessment Lower Extremity Assessment: Overall WFL for tasks assessed(R  ankle not tested due to pain)    Cervical / Trunk Assessment Cervical / Trunk Assessment: Normal  Communication   Communication: No difficulties  Cognition Arousal/Alertness: Awake/alert Behavior During Therapy: WFL for tasks assessed/performed Overall Cognitive Status: Within Functional Limits for tasks assessed                                        General Comments General comments (skin integrity, edema, etc.): Discussed overuse strains of arm  and shoulders with RW, Knee walker for long distance ambulation, and importance of following MD's instructions with DM to prevent complications.     Exercises     Assessment/Plan    PT Assessment Patient needs continued PT services  PT Problem List Decreased strength;Decreased activity tolerance;Decreased range of motion;Decreased balance;Decreased mobility;Decreased coordination       PT Treatment Interventions DME instruction;Gait training;Stair training;Functional mobility training;Therapeutic activities;Therapeutic exercise;Balance training    PT Goals (Current goals can be found in the Care Plan section)  Acute Rehab PT Goals Patient Stated Goal: return to aunts to recover  PT Goal Formulation: With patient Time For Goal Achievement: 04/15/17 Potential to Achieve Goals: Good    Frequency Min 3X/week   Barriers to discharge        Co-evaluation               AM-PAC PT "6 Clicks" Daily Activity  Outcome Measure Difficulty turning over in bed (including adjusting bedclothes, sheets and blankets)?: None Difficulty moving from lying on back to sitting on the side of the bed? : None Difficulty sitting down on and standing up from a chair with arms (e.g., wheelchair, bedside commode, etc,.)?: None Help needed moving to and from a bed to chair (including a wheelchair)?: A Little Help needed walking in hospital room?: A Little Help needed climbing 3-5 steps with a railing? : A Little 6 Click Score: 21    End of Session Equipment Utilized During Treatment: Gait belt Activity Tolerance: Patient tolerated treatment well Patient left: in bed;with call bell/phone within reach Nurse Communication: Mobility status PT Visit Diagnosis: Unsteadiness on feet (R26.81);Pain Pain - Right/Left: Right Pain - part of body: Ankle and joints of foot    Time: 1749-1811 PT Time Calculation (min) (ACUTE ONLY): 22 min   Charges:   PT Evaluation $PT Eval Low Complexity: 1 Low PT  Treatments $Gait Training: 8-22 mins   PT G Codes:        Etta Grandchild, PT, DPT Acute Rehab Services Pager: [phone]    Etta Grandchild 04/13/2017, 6:18 PM

## 2017-04-13 NOTE — Progress Notes (Signed)
  PROGRESS NOTE  Jerry RangSamuel Arnold ZOX:[id number] DOB: 08-16-1985 DOA: 04/11/2017 PCP: Patient, No Pcp Per  Brief Narrative: 31yom PMH DM presented with right oe pain, admitted for acute osteomyelitis and gangrene right second toe.  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  Resume Lantus.  Continue sliding scale insulin.  DVT prophylaxis: SCDs Code Status: full Family Communication: none Disposition Plan: home    Brendia Sacksaniel Goodrich, MD  [facility] Direct contact: [phone] --Via amion app OR  --[email or web address]; password [email or web address]  7PM-7AM contact night coverage as above 04/13/2017, 12:27 PM  LOS: 2 days   Consultants:  Orthopedics   Procedures:  2/23 SECOND FOOT RAY AMPUTATION  Antimicrobials:  Zosyn 2/21 >>  Vancomycin 2/21 >>  Interval history/Subjective: Feels cold.  Vomiting per RN  Objective: Vitals:  Vitals:   04/13/17 0945 04/13/17 1015  BP:  (!) 161/108  Pulse: 86 91  Resp: 17 18  Temp: 97.7 F (36.5 C) 99.6 F (37.6 C)  SpO2: 97% 99%    Exam:  Constitutional:   . Appears calm, uncomfortable, nontoxic Respiratory:  . CTA bilaterally, no w/r/r.  . Respiratory effort normal Cardiovascular:  . RRR, no m/r/g Psychiatric:  . Mental status o Mood, affect appropriate  I have personally reviewed the following:    Labs:  Blood sugars stable  Scheduled Meds: . feeding supplement (PRO-STAT SUGAR FREE 64)  30 mL Oral BID  . insulin aspart  0-20 Units Subcutaneous TID WC  . insulin aspart  0-5 Units Subcutaneous QHS  . insulin glargine  20 Units Subcutaneous QHS  . methocarbamol      . polyethylene glycol  17 g Oral BID   Continuous Infusions: . sodium chloride    . methocarbamol (ROBAXIN)  IV    . piperacillin-tazobactam (ZOSYN)  IV 3.375  g (04/13/17 0540)  . vancomycin Stopped (04/13/17 0253)    Principal Problem:   Acute osteomyelitis of toe, right (HCC) Active Problems:   Diabetes (HCC)   Gangrene of toe of right foot (HCC)   Cellulitis of foot   LOS: 2 days

## 2017-04-14 ENCOUNTER — Encounter (HOSPITAL_COMMUNITY): Payer: Self-pay | Admitting: Orthopedic Surgery

## 2017-04-14 LAB — GLUCOSE, CAPILLARY
Glucose-Capillary: 245 mg/dL — ABNORMAL HIGH (ref 65–99)
Glucose-Capillary: 244 mg/dL — ABNORMAL HIGH (ref 65–99)
Glucose-Capillary: 200 mg/dL — ABNORMAL HIGH (ref 65–99)
Glucose-Capillary: 252 mg/dL — ABNORMAL HIGH (ref 65–99)
Glucose-Capillary: 114 mg/dL — ABNORMAL HIGH (ref 65–99)

## 2017-04-14 MED ORDER — METOCLOPRAMIDE HCL 5 MG/ML IJ SOLN
10.0000 mg | Freq: Once | INTRAMUSCULAR | Status: AC
  Administered 2017-04-14: 10 mg via INTRAVENOUS
  Filled 2017-04-14: qty 2

## 2017-04-14 MED ORDER — PROMETHAZINE HCL 25 MG/ML IJ SOLN
12.5000 mg | Freq: Four times a day (QID) | INTRAMUSCULAR | Status: DC | PRN
  Administered 2017-04-14 – 2017-04-15 (×4): 12.5 mg via INTRAVENOUS
  Filled 2017-04-14 (×4): qty 1

## 2017-04-14 NOTE — Progress Notes (Signed)
Orthopedic Tech Progress Note Patient Details:  Jerry Arnold 10/18/1985 [id number]  Ortho Devices Type of Ortho Device: Postop shoe/boot Ortho Device/Splint Interventions: Application   Post Interventions Patient Tolerated: Well Instructions Provided: Care of device   [name] 04/14/2017, 3:24 PM

## 2017-04-14 NOTE — Progress Notes (Signed)
Pharmacy Antibiotic Note  Jerry Arnold is a 32 y.o. male admitted on 04/11/2017 with osteomyelitis.  Pharmacy has been consulted for vancomycin and Zosyn dosing. He is s/p 2nd ray amputation on 2/23 and antibiotic plans at that time were- Continue  for 3 days postoperatively (end date around 2/25) -CrCl > 100   Plan: Zosyn 3.375g IV q8h EI Vancomycin 1g IV q8h based on obesity nomogram Will check a vancomycin trough if continues past 2/25  Height: 6\' 1"  (185.4 cm) Weight: 232 lb 5.8 oz (105.4 kg) IBW/kg (Calculated) : 79.9  Temp (24hrs), Avg:98.5 F (36.9 C), Min:97.7 F (36.5 C), Max:99.4 F (37.4 C)  Recent Labs  Lab 04/11/17 1705 04/11/17 1713 04/12/17 0036  WBC 11.3*  --  10.9*  CREATININE 1.02  --  1.00  LATICACIDVEN  --  1.12  --     Estimated Creatinine Clearance: 135.2 mL/min (by C-G formula based on SCr of 1 mg/dL).    No Known Allergies  Antimicrobials this admission: Vancomycin 2/21 >>  Zosyn 2/21 >>   Dose adjustments this admission: n/a  Microbiology results: 2/21 BCx:  ngtd 2/23 wound- pending   Thank you for allowing pharmacy to be a part of this patient's care.  Harland GermanAndrew Cherise Fedder, Pharm D 04/14/2017 2:50 PM

## 2017-04-14 NOTE — Progress Notes (Signed)
Pt BP 162/107, MAP 120, Pulse 99. Pt c/o nausea and vomiting. Notified Blount NP. Will continue to monitor.

## 2017-04-14 NOTE — Progress Notes (Signed)
PT Cancellation Note  Patient Details Name: Jerry Arnold MRN: [id number] DOB: 07-17-1985   Cancelled Treatment:    Reason Eval/Treat Not Completed: Medical issues which prohibited therapy. Pt had elevated BP and nausea and vomiting this AM. PT will check back tomorrow as time allows to follow-up before discharge.   Colin BroachSabra M. Chucky Homes PT, DPT    Ruel FavorsSabra Aletha HalimMarie Jaskirat Zertuche 04/14/2017, 12:01 PM

## 2017-04-14 NOTE — Progress Notes (Signed)
  PROGRESS NOTE  Jerry Arnold [id number]:[id number] DOB: 09/22/1985 DOA: 04/11/2017 PCP: Patient, No Pcp Per  Brief Narrative: 31yom PMH DM presented with right oe pain, admitted for acute osteomyelitis and gangrene right second toe.  Assessment/Plan Osteomyelitis of the distal phalanx right second toe and gangrene. 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  Follow-up cultures.  Continue empiric antibiotics for now.  DM uncontrolled, HgbA1c 14.0 -Blood sugars remain stable.  Continue Lantus.  Continue sliding scale insulin.  DVT prophylaxis: SCDs Code Status: full Family Communication: none Disposition Plan: home, no PT follow-up   Brendia Sacksaniel Chukwudi Ewen, MD  [facility] Direct contact: [phone] --Via amion app OR  --[email or web address]; password [email or web address]  7PM-7AM contact night coverage as above 04/14/2017, 3:19 PM  LOS: 3 days   Consultants:  Orthopedics   Procedures:  2/23 SECOND FOOT RAY AMPUTATION  Antimicrobials:  Zosyn 2/21 >>  Vancomycin 2/21 >>  Interval history/Subjective: Had nausea and vomiting earlier but none now.  Tolerating diet.  Objective: Vitals:  Vitals:   04/14/17 0556 04/14/17 1320  BP: (!) 162/107 (!) 146/93  Pulse: 99 91  Resp: 20   Temp: 98.6 F (37 C) 97.7 F (36.5 C)  SpO2: 100% 98%    Exam:  Constitutional:   Appears calm, mildly uncomfortable Respiratory:  . CTA bilaterally, no w/r/r.  . Respiratory effort normal  Cardiovascular:  . RRR, no m/r/g Psychiatric:  . Mental status o Mood, affect appropriate  I have personally reviewed the following:    Labs:  Blood sugars remain stable  Scheduled Meds: . feeding supplement (PRO-STAT SUGAR FREE 64)  30 mL Oral BID  . insulin aspart  0-20 Units Subcutaneous TID WC  . insulin aspart  0-5 Units Subcutaneous QHS  . insulin glargine  20 Units Subcutaneous QHS  . polyethylene glycol  17 g Oral BID   Continuous Infusions: . sodium chloride  10 mL/hr at 04/13/17 1600  . methocarbamol (ROBAXIN)  IV    . piperacillin-tazobactam (ZOSYN)  IV 3.375 g (04/14/17 1303)  . vancomycin 1,000 mg (04/14/17 1303)    Principal Problem:   Acute osteomyelitis of toe, right (HCC) Active Problems:   Diabetes (HCC)   Gangrene of toe of right foot (HCC)   Cellulitis of foot   LOS: 3 days

## 2017-04-15 LAB — URINALYSIS, ROUTINE W REFLEX MICROSCOPIC
Bacteria, UA: NONE SEEN
Bilirubin Urine: NEGATIVE
Glucose, UA: NEGATIVE mg/dL
Ketones, ur: NEGATIVE mg/dL
Leukocytes, UA: NEGATIVE
Nitrite: NEGATIVE
Protein, ur: NEGATIVE mg/dL
Specific Gravity, Urine: 1.004 — ABNORMAL LOW (ref 1.005–1.030)
Squamous Epithelial / LPF: NONE SEEN
pH: 6 (ref 5.0–8.0)

## 2017-04-15 LAB — GLUCOSE, CAPILLARY
Glucose-Capillary: 232 mg/dL — ABNORMAL HIGH (ref 65–99)
Glucose-Capillary: 230 mg/dL — ABNORMAL HIGH (ref 65–99)
Glucose-Capillary: 150 mg/dL — ABNORMAL HIGH (ref 65–99)
Glucose-Capillary: 129 mg/dL — ABNORMAL HIGH (ref 65–99)
Glucose-Capillary: 188 mg/dL — ABNORMAL HIGH (ref 65–99)

## 2017-04-15 LAB — CBC
HCT: 33 % — ABNORMAL LOW (ref 39.0–52.0)
Hemoglobin: 10.8 g/dL — ABNORMAL LOW (ref 13.0–17.0)
MCH: 27.9 pg (ref 26.0–34.0)
MCHC: 32.7 g/dL (ref 30.0–36.0)
MCV: 85.3 fL (ref 78.0–100.0)
Platelets: 377 10*3/uL (ref 150–400)
RBC: 3.87 MIL/uL — ABNORMAL LOW (ref 4.22–5.81)
RDW: 13.9 % (ref 11.5–15.5)
WBC: 10.1 10*3/uL (ref 4.0–10.5)

## 2017-04-15 LAB — BASIC METABOLIC PANEL
Anion gap: 13 (ref 5–15)
BUN: 8 mg/dL (ref 6–20)
CO2: 26 mmol/L (ref 22–32)
Calcium: 8.8 mg/dL — ABNORMAL LOW (ref 8.9–10.3)
Chloride: 98 mmol/L — ABNORMAL LOW (ref 101–111)
Creatinine, Ser: 2.12 mg/dL — ABNORMAL HIGH (ref 0.61–1.24)
GFR calc Af Amer: 46 mL/min — ABNORMAL LOW (ref >60)
GFR calc non Af Amer: 40 mL/min — ABNORMAL LOW (ref >60)
Glucose, Bld: 241 mg/dL — ABNORMAL HIGH (ref 65–99)
Potassium: 3.7 mmol/L (ref 3.5–5.1)
Sodium: 137 mmol/L (ref 135–145)

## 2017-04-15 MED ORDER — SODIUM CHLORIDE 0.9 % IV SOLN
2.0000 g | INTRAVENOUS | Status: DC
  Administered 2017-04-15 – 2017-04-17 (×3): 2 g via INTRAVENOUS
  Filled 2017-04-15 (×3): qty 20

## 2017-04-15 MED ORDER — PROMETHAZINE HCL 25 MG/ML IJ SOLN
12.5000 mg | Freq: Four times a day (QID) | INTRAMUSCULAR | Status: DC | PRN
  Administered 2017-04-16: 12.5 mg via INTRAVENOUS
  Filled 2017-04-15 (×2): qty 1

## 2017-04-15 MED ORDER — SODIUM CHLORIDE 0.9 % IV SOLN
INTRAVENOUS | Status: DC
  Administered 2017-04-15: 23:00:00 via INTRAVENOUS
  Administered 2017-04-15 – 2017-04-16 (×4): 1000 mL via INTRAVENOUS
  Administered 2017-04-16 – 2017-04-18 (×6): via INTRAVENOUS

## 2017-04-15 MED ORDER — INSULIN GLARGINE 100 UNIT/ML ~~LOC~~ SOLN
30.0000 [IU] | Freq: Every day | SUBCUTANEOUS | Status: DC
  Administered 2017-04-15 – 2017-04-16 (×2): 30 [IU] via SUBCUTANEOUS
  Filled 2017-04-15 (×2): qty 0.3

## 2017-04-15 NOTE — Progress Notes (Signed)
Pt BP 161/100. Pt laying in bed. Denies pain. NP notified. Awaiting orders.

## 2017-04-15 NOTE — Progress Notes (Signed)
Patient ID: [name], male   DOB: 1985/04/21, 32 y.o.   MRN: [id number] Patient is postoperative day 2-second ray amputation with a large deep abscess.  Discussed with the patient that he would be on IV antibiotics for a few more days prior to discharge he would need to be strict nonweightbearing on his foot I will follow-up in the office in 1 week.  Dressing to remain in place and changed at office follow-up.

## 2017-04-15 NOTE — Progress Notes (Signed)
  PROGRESS NOTE  Jerry Arnold [id number] DOB: 06/03/1985 DOA: 04/11/2017 PCP: Patient, No Pcp Per  Brief Narrative: 31yom PMH DM presented with right oe pain, admitted for acute osteomyelitis and gangrene right second toe.  Assessment/Plan Osteomyelitis of the distal phalanx right second toe and gangrene. 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  Follow-up cultures.  Change to ceftriaxone.   AKI -etiology unclear, poor oral intake, nausea, vomiting but BUN WNL. Ddx includes ATN. Will d/c vanc/Zosyn - IVF, check U/A, check BMP in AM  N/V - abdomen benign. Suspect post-op +/- abx.   DM uncontrolled, HgbA1c 14.0 -Blood sugars stable.  Continue Lantus and sliding scale insulin.  DVT prophylaxis: SCDs Code Status: full Family Communication: none Disposition Plan: home, no PT follow-up   Brendia Sacksaniel Jazzlin Clements, MD  [facility] Direct contact: [phone] --Via amion app OR  --[email or web address]; password [email or web address]  7PM-7AM contact night coverage as above 04/15/2017, 4:12 PM  LOS: 4 days   Consultants:  Orthopedics   Procedures:  2/23 SECOND FOOT RAY AMPUTATION  Antimicrobials:  Zosyn 2/21 >>  Vancomycin 2/21 >>  Interval history/Subjective: Feels nauseous.  Some vomiting.  Poor oral intake.  Objective: Vitals:  Vitals:   04/15/17 0506 04/15/17 1342  BP: (!) 161/100 (!) 164/99  Pulse: 93 91  Resp: 16 18  Temp: 99.4 F (37.4 C) 98.8 F (37.1 C)  SpO2: 97% 99%    Exam:  Constitutional:   . Appears calm but uncomfortable Eyes:  . pupils and irises appear normal . Normal lids  ENMT:  . grossly normal hearing  . Lips appear normal Respiratory:  . CTA bilaterally, no w/r/r.  . Respiratory effort normal Cardiovascular:  . RRR, no m/r/g Abdomen:  . Soft, nt Psychiatric:  . Mental status o Mood, affect appropriate  I have personally reviewed the following:    Labs:  Blood sugar  stable  Creatinine now elevated, 2.12.  BUN within normal limits.  Hemoglobin stable 10.8   Scheduled Meds: . feeding supplement (PRO-STAT SUGAR FREE 64)  30 mL Oral BID  . insulin aspart  0-20 Units Subcutaneous TID WC  . insulin aspart  0-5 Units Subcutaneous QHS  . insulin glargine  30 Units Subcutaneous QHS  . polyethylene glycol  17 g Oral BID   Continuous Infusions: . sodium chloride 10 mL/hr at 04/13/17 1600  . sodium chloride 1,000 mL (04/15/17 1558)  . cefTRIAXone (ROCEPHIN)  IV Stopped (04/15/17 0929)  . methocarbamol (ROBAXIN)  IV      Principal Problem:   Acute osteomyelitis of toe, right (HCC) Active Problems:   Diabetes (HCC)   Gangrene of toe of right foot (HCC)   Cellulitis of foot   LOS: 4 days

## 2017-04-15 NOTE — Progress Notes (Signed)
BP today is 164/99; Pulse 91. Patient alert and oriented, resting quietly in bed. No complaints. MD notified; no new orders at this time. Will continue to monitor.

## 2017-04-15 NOTE — Progress Notes (Signed)
Physical Therapy Treatment Patient Details Name: Jerry RangSamuel Blacksher MRN: [id number] DOB: 1985-06-06 Today's Date: 04/15/2017    History of Present Illness 31yom PMH DM presented with right oe pain, admitted for acute osteomyelitis and gangrene right second toe. 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 He states that he just doesn't feel well. Sister present in the room and encouraging pt to participate in therapy. Pt agreeable after encouragement. Introduced the knee scooter. Pt demonstrated good control of knee scooter and able to ambulate 80 feet. No LOB. Good stability noted. PT to continue to address ambulation with knee scooter vs RW.    Follow Up Recommendations  No PT follow up;Supervision for mobility/OOB     Equipment Recommendations  Rolling walker with 5" wheels;Other (comment)(knee scooter)    Recommendations for Other Services       Precautions / Restrictions Precautions Precautions: None Restrictions Weight Bearing Restrictions: Yes RLE Weight Bearing: Non weight bearing    Mobility  Bed Mobility Overal bed mobility: Modified Independent                Transfers Overall transfer level: Needs assistance Equipment used: Ambulation equipment used Transfers: Sit to/from Stand Sit to Stand: Min guard         General transfer comment: min guard to stabilize initial standing balance  Ambulation/Gait Ambulation/Gait assistance: Min guard Ambulation Distance (Feet): 80 Feet Assistive device: (knee scooter) Gait Pattern/deviations: Step-to pattern;Decreased stride length Gait velocity: decreased Gait velocity interpretation: Below normal speed for age/gender General Gait Details: Knee scooter used for ambulation. Pt educate on knee/leg placement, hand brakes, and steering. Standing rest break needed after 50 feet due to fatigue.   Stairs            Wheelchair Mobility    Modified Rankin (Stroke Patients  Only)       Balance Overall balance assessment: No apparent balance deficits (not formally assessed)                                          Cognition Arousal/Alertness: Awake/alert Behavior During Therapy: WFL for tasks assessed/performed Overall Cognitive Status: Within Functional Limits for tasks assessed                                        Exercises      General Comments General comments (skin integrity, edema, etc.): Discussed importance of maintaining NWB RLE.      Pertinent Vitals/Pain Pain Assessment: No/denies pain    Home Living                      Prior Function            PT Goals (current goals can now be found in the care plan section) Acute Rehab PT Goals Patient Stated Goal: feel better PT Goal Formulation: With patient Time For Goal Achievement: 04/26/17 Potential to Achieve Goals: Good Progress towards PT goals: Progressing toward goals    Frequency    Min 3X/week      PT Plan Current plan remains appropriate    Co-evaluation              AM-PAC PT "6 Clicks" Daily Activity  Outcome Measure  Difficulty turning  over in bed (including adjusting bedclothes, sheets and blankets)?: None Difficulty moving from lying on back to sitting on the side of the bed? : None Difficulty sitting down on and standing up from a chair with arms (e.g., wheelchair, bedside commode, etc,.)?: A Little Help needed moving to and from a bed to chair (including a wheelchair)?: A Little Help needed walking in hospital room?: A Little Help needed climbing 3-5 steps with a railing? : A Little 6 Click Score: 20    End of Session Equipment Utilized During Treatment: Gait belt Activity Tolerance: Patient tolerated treatment well Patient left: in bed;with call bell/phone within reach;with family/visitor present Nurse Communication: Mobility status PT Visit Diagnosis: Unsteadiness on feet (R26.81)     Time:  1000-1036 PT Time Calculation (min) (ACUTE ONLY): 36 min  Charges:  $Gait Training: 23-37 mins                    G Codes:       Aida Raider, PT  Office # [phone] Pager [phone]    Ilda Foil 04/15/2017, 11:33 AM

## 2017-04-15 NOTE — Progress Notes (Addendum)
Inpatient Diabetes Program Recommendations  AACE/ADA: New Consensus Statement on Inpatient Glycemic Control (2015)  Target Ranges:  Prepandial:   less than 140 mg/dL      Peak postprandial:   less than 180 mg/dL (1-2 hours)      Critically ill patients:  140 - 180 mg/dL   Lab Results  Component Value Date   GLUCAP 150 (H) 04/15/2017   HGBA1C 14.0 (H) 04/12/2017    Review of Glycemic Control Results for Jerry Arnold, Jerry Arnold (MRN [id number]) as of 04/15/2017 12:13  Ref. Range 04/14/2017 16:47 04/14/2017 21:24 04/15/2017 08:22 04/15/2017 11:59  Glucose-Capillary Latest Ref Range: 65 - 99 mg/dL 252 (H) 114 (H) 230 (H) 150 (H)   Diabetes history: Type 2 DM Outpatient Diabetes medications: Metformin 500 mg BID, Novolog 70/30 50 Units every other day Current orders for Inpatient glycemic control: Novolog 0-5 units QHS, Novolog 0-20 Units TID, Lantus 20 Units QHS  Inpatient Diabetes Program Recommendations:     AM FSBS 241 mg/dL, please consider increasing Lantus 30 Units QHS (111.7 kg X 0.3). Text page sent.  A1C 14%, needs PCP. Will place consult for CM.   Addendum '@1400' : Spoke with patient regarding home regimen and confirmed that patient took Metformin 500 mg BID and Novolog 70/30 50 units every other day. He states, "I have the medications I need about 50% of the time and finances are an issue." Patient admits to skipping multiple doses, simply because he cannot afford the medication. He normally gets the Novolog 70/30 from [facility] for $25/vial. He admits to making his own dose, this did not come from PCP. Patient not currently checking BS as he does not have a meter or test strips.   Reviewed patient's current A1c of 14%. Explained what a A1c is and what it measures. Also reviewed goal A1c with patient, importance of good glucose control @ home, and blood sugar goals. Encouraged patient to get a meter, handed out flyer for [employer] meter and test strips, and instructed to begin checking blood sugars  at least 2X/day. Discussed in depth the importance of establishing BS control, informed about long term co-morbidies that could result from poor management.  Patient has had DM for years and has just recently had trouble purchasing the needed medications and supplies. Informed about [facility] and that a CM would be coming to establish a PCP. He was receptive and had no further questions.   At this time, Novolog 70/30 is still the most financially appropriate option for discharge. Blood glucose meter kit (includes lancets and strips) #[id number]   Thanks, Bronson Curb, MSN, RNC-OB Diabetes Coordinator [phone] (8a-5p)

## 2017-04-16 ENCOUNTER — Inpatient Hospital Stay (HOSPITAL_COMMUNITY): Payer: Self-pay

## 2017-04-16 DIAGNOSIS — R112 Nausea with vomiting, unspecified: Secondary | ICD-10-CM

## 2017-04-16 DIAGNOSIS — N179 Acute kidney failure, unspecified: Secondary | ICD-10-CM

## 2017-04-16 LAB — HEPATIC FUNCTION PANEL
ALT: 9 U/L — ABNORMAL LOW (ref 17–63)
AST: 13 U/L — ABNORMAL LOW (ref 15–41)
Albumin: 2.3 g/dL — ABNORMAL LOW (ref 3.5–5.0)
Alkaline Phosphatase: 80 U/L (ref 38–126)
Bilirubin, Direct: <0.1 mg/dL — ABNORMAL LOW (ref 0.1–0.5)
Total Bilirubin: 0.4 mg/dL (ref 0.3–1.2)
Total Protein: 7.8 g/dL (ref 6.5–8.1)

## 2017-04-16 LAB — CULTURE, BLOOD (ROUTINE X 2)
Culture: NO GROWTH
Culture: NO GROWTH
Special Requests: ADEQUATE

## 2017-04-16 LAB — BASIC METABOLIC PANEL
Anion gap: 11 (ref 5–15)
BUN: 8 mg/dL (ref 6–20)
CO2: 26 mmol/L (ref 22–32)
Calcium: 8.4 mg/dL — ABNORMAL LOW (ref 8.9–10.3)
Chloride: 107 mmol/L (ref 101–111)
Creatinine, Ser: 2.2 mg/dL — ABNORMAL HIGH (ref 0.61–1.24)
GFR calc Af Amer: 44 mL/min — ABNORMAL LOW (ref >60)
GFR calc non Af Amer: 38 mL/min — ABNORMAL LOW (ref >60)
Glucose, Bld: 177 mg/dL — ABNORMAL HIGH (ref 65–99)
Potassium: 3.4 mmol/L — ABNORMAL LOW (ref 3.5–5.1)
Sodium: 144 mmol/L (ref 135–145)

## 2017-04-16 LAB — CBC
HCT: 34.6 % — ABNORMAL LOW (ref 39.0–52.0)
Hemoglobin: 11.4 g/dL — ABNORMAL LOW (ref 13.0–17.0)
MCH: 28.3 pg (ref 26.0–34.0)
MCHC: 32.9 g/dL (ref 30.0–36.0)
MCV: 85.9 fL (ref 78.0–100.0)
Platelets: 430 10*3/uL — ABNORMAL HIGH (ref 150–400)
RBC: 4.03 MIL/uL — ABNORMAL LOW (ref 4.22–5.81)
RDW: 14 % (ref 11.5–15.5)
WBC: 11.8 10*3/uL — ABNORMAL HIGH (ref 4.0–10.5)

## 2017-04-16 LAB — GLUCOSE, CAPILLARY
Glucose-Capillary: 204 mg/dL — ABNORMAL HIGH (ref 65–99)
Glucose-Capillary: 78 mg/dL (ref 65–99)
Glucose-Capillary: 91 mg/dL (ref 65–99)
Glucose-Capillary: 85 mg/dL (ref 65–99)

## 2017-04-16 LAB — LIPASE, BLOOD: Lipase: 21 U/L (ref 11–51)

## 2017-04-16 MED ORDER — ONDANSETRON HCL 4 MG/2ML IJ SOLN
4.0000 mg | Freq: Four times a day (QID) | INTRAMUSCULAR | Status: DC
  Administered 2017-04-16 – 2017-04-18 (×5): 4 mg via INTRAVENOUS
  Filled 2017-04-16 (×6): qty 2

## 2017-04-16 MED ORDER — INSULIN ASPART 100 UNIT/ML ~~LOC~~ SOLN: 0.0000 [IU] | Freq: Four times a day (QID) | SUBCUTANEOUS | Status: DC

## 2017-04-16 MED ORDER — HYDRALAZINE HCL 20 MG/ML IJ SOLN
10.0000 mg | Freq: Once | INTRAMUSCULAR | Status: AC
  Administered 2017-04-16: 10 mg via INTRAVENOUS
  Filled 2017-04-16: qty 1

## 2017-04-16 MED ORDER — ALUM & MAG HYDROXIDE-SIMETH 200-200-20 MG/5ML PO SUSP
30.0000 mL | ORAL | Status: DC | PRN
  Administered 2017-04-16: 30 mL via ORAL
  Filled 2017-04-16: qty 30

## 2017-04-16 MED ORDER — DEXTROSE 50 % IV SOLN
INTRAVENOUS | Status: AC
  Administered 2017-04-16: 25 mL
  Filled 2017-04-16: qty 50

## 2017-04-16 MED ORDER — POTASSIUM CHLORIDE 10 MEQ/100ML IV SOLN
10.0000 meq | INTRAVENOUS | Status: AC
  Administered 2017-04-16 (×4): 10 meq via INTRAVENOUS
  Filled 2017-04-16 (×4): qty 100

## 2017-04-16 MED ORDER — PROMETHAZINE HCL 25 MG/ML IJ SOLN: 12.5000 mg | Freq: Once | INTRAMUSCULAR | Status: DC

## 2017-04-16 MED ORDER — PROCHLORPERAZINE EDISYLATE 5 MG/ML IJ SOLN
10.0000 mg | Freq: Four times a day (QID) | INTRAMUSCULAR | Status: DC | PRN
  Administered 2017-04-17 – 2017-04-18 (×3): 10 mg via INTRAVENOUS
  Filled 2017-04-16 (×4): qty 2

## 2017-04-16 NOTE — Progress Notes (Signed)
Patient refused his medicine this morning. He is complaining of stomach pain and nausea/vomiting. He refused Phenergan because is not working. His family is worried about him and they requested to speak with his doctor. MD made aware. Will continue to monitor.

## 2017-04-16 NOTE — Progress Notes (Signed)
PROGRESS NOTE  Carren RangSamuel Goeken YQM:[id number] DOB: 10/04/1985 DOA: 04/11/2017 PCP: Patient, No Pcp Per  Brief Narrative: 31yom PMH DM presented with right oe pain, admitted for acute osteomyelitis and gangrene right second toe.  He was seen by orthopedics and underwent right second ray amputation.  No afebrile greater than 48 hours.  Developed acute kidney injury which may be peaking.  Hospitalization was uncomplicated in addition by ongoing nausea and vomiting.  Imaging negative.  Anticipate discharge when nausea, vomiting resolved and renal function has returned to baseline.  Assessment/Plan Osteomyelitis of the distal phalanx right second toe and gangrene. 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 Blood cultures no growth today.  Wound culture few group B strep, rare MSSA -Continue ceftriaxone today.  Likely discontinued 2/27 if culture data remains negative.  AKI, nonoliguric -Etiology unclear, suspect multifactorial including poor oral intake, nausea, vomiting, possibly ATN secondary to vancomycin/Zosyn.  Urinalysis unremarkable. -May be peaking.  Check BMP in a.m.  Continue aggressive IV fluids.  N/V - abdomen benign.  Abdominal x-ray negative.  LFTs and lipase unremarkable.  Suspect post-op +/- abx.  -Supportive care.  Schedule Zosyn.  Phenergan ineffective.  Compazine for breakthrough nausea, vomiting.  DM uncontrolled, HgbA1c 14.0 -Stable.  Continue Lantus and sliding scale insulin.  DVT prophylaxis: SCDs Code Status: full Family Communication: none Disposition Plan: home, no PT follow-up   Brendia Sacksaniel Natalea Sutliff, MD  [facility] Direct contact: [phone] --Via amion app OR  --[email or web address]; password [email or web address]  7PM-7AM contact night coverage as above 04/16/2017, 4:38 PM  LOS: 5 days   Consultants:  Orthopedics   Procedures:  2/23 SECOND FOOT RAY AMPUTATION  Antimicrobials:  Zosyn 2/21 >> 2/25  Vancomycin 2/21  >> 2/25  Ceftriaxone 2/25 >>   Interval history/Subjective: Frequent nausea and vomiting.  Some abdominal discomfort.  Unable to tolerate any p.o.  Objective: Vitals:  Vitals:   04/16/17 0656 04/16/17 1329  BP: (!) 162/99 (!) 168/109  Pulse: 93 88  Resp: 18   Temp: 98.3 F (36.8 C) 99 F (37.2 C)  SpO2: 99% 98%    Exam: Constitutional:   . Appears calm but quite uncomfortable.  Nontoxic. Eyes:  . pupils and irises appear normal . Normal lids  ENMT:  . grossly normal hearing  . Lips appear normal Respiratory:  . CTA bilaterally, no w/r/r.  . Respiratory effort normal Cardiovascular:  . RRR, no m/r/g Abdomen:  . Soft, nontender, nondistended.   Psychiatric:  . Mental status o Mood, affect appropriate  I have personally reviewed the following:    Labs:  Blood sugars stable  Creatinine 2.122.20 >>  Lipase within normal limits, LFTs unremarkable  WBC elevated, 11.8.  Hemoglobin stable, 11.4.  Urinalysis unremarkable.  Abdominal x-ray negative for acute disease.  No evidence of ileus or obstruction.   Scheduled Meds: . feeding supplement (PRO-STAT SUGAR FREE 64)  30 mL Oral BID  . insulin aspart  0-20 Units Subcutaneous Q6H  . insulin glargine  30 Units Subcutaneous QHS  . ondansetron (ZOFRAN) IV  4 mg Intravenous Q6H  . polyethylene glycol  17 g Oral BID   Continuous Infusions: . sodium chloride 1,000 mL (04/16/17 1358)  . cefTRIAXone (ROCEPHIN)  IV Stopped (04/16/17 0940)    Principal Problem:   Acute osteomyelitis of toe, right (HCC) Active Problems:   Diabetes (HCC)   Gangrene of toe of right foot (HCC)   Cellulitis of foot   AKI (acute kidney injury) (HCC)  Nausea and vomiting   LOS: 5 days

## 2017-04-17 ENCOUNTER — Inpatient Hospital Stay (HOSPITAL_COMMUNITY): Payer: Self-pay

## 2017-04-17 LAB — GLUCOSE, CAPILLARY
Glucose-Capillary: 101 mg/dL — ABNORMAL HIGH (ref 65–99)
Glucose-Capillary: 113 mg/dL — ABNORMAL HIGH (ref 65–99)
Glucose-Capillary: 68 mg/dL (ref 65–99)
Glucose-Capillary: 75 mg/dL (ref 65–99)
Glucose-Capillary: 88 mg/dL (ref 65–99)
Glucose-Capillary: 92 mg/dL (ref 65–99)

## 2017-04-17 LAB — URINALYSIS, ROUTINE W REFLEX MICROSCOPIC
Bilirubin Urine: NEGATIVE
Glucose, UA: NEGATIVE mg/dL
Ketones, ur: NEGATIVE mg/dL
Leukocytes, UA: NEGATIVE
Nitrite: NEGATIVE
Protein, ur: NEGATIVE mg/dL
Specific Gravity, Urine: 1.004 — ABNORMAL LOW (ref 1.005–1.030)
Squamous Epithelial / LPF: NONE SEEN
pH: 6 (ref 5.0–8.0)

## 2017-04-17 LAB — COMPREHENSIVE METABOLIC PANEL
ALT: 9 U/L — ABNORMAL LOW (ref 17–63)
AST: 14 U/L — ABNORMAL LOW (ref 15–41)
Albumin: 2.3 g/dL — ABNORMAL LOW (ref 3.5–5.0)
Alkaline Phosphatase: 75 U/L (ref 38–126)
Anion gap: 11 (ref 5–15)
BUN: 8 mg/dL (ref 6–20)
CO2: 23 mmol/L (ref 22–32)
Calcium: 8.4 mg/dL — ABNORMAL LOW (ref 8.9–10.3)
Chloride: 112 mmol/L — ABNORMAL HIGH (ref 101–111)
Creatinine, Ser: 2.35 mg/dL — ABNORMAL HIGH (ref 0.61–1.24)
GFR calc Af Amer: 40 mL/min — ABNORMAL LOW (ref >60)
GFR calc non Af Amer: 35 mL/min — ABNORMAL LOW (ref >60)
Glucose, Bld: 78 mg/dL (ref 65–99)
Potassium: 3.5 mmol/L (ref 3.5–5.1)
Sodium: 146 mmol/L — ABNORMAL HIGH (ref 135–145)
Total Bilirubin: 0.5 mg/dL (ref 0.3–1.2)
Total Protein: 7.4 g/dL (ref 6.5–8.1)

## 2017-04-17 LAB — CBC
HCT: 33.3 % — ABNORMAL LOW (ref 39.0–52.0)
Hemoglobin: 10.3 g/dL — ABNORMAL LOW (ref 13.0–17.0)
MCH: 26.7 pg (ref 26.0–34.0)
MCHC: 30.9 g/dL (ref 30.0–36.0)
MCV: 86.3 fL (ref 78.0–100.0)
Platelets: 394 10*3/uL (ref 150–400)
RBC: 3.86 MIL/uL — ABNORMAL LOW (ref 4.22–5.81)
RDW: 14.5 % (ref 11.5–15.5)
WBC: 9.2 10*3/uL (ref 4.0–10.5)

## 2017-04-17 MED ORDER — HEPARIN SODIUM (PORCINE) 5000 UNIT/ML IJ SOLN
5000.0000 [IU] | Freq: Three times a day (TID) | INTRAMUSCULAR | Status: DC
  Administered 2017-04-17 – 2017-04-20 (×8): 5000 [IU] via SUBCUTANEOUS
  Filled 2017-04-17 (×8): qty 1

## 2017-04-17 MED ORDER — PROMETHAZINE HCL 25 MG/ML IJ SOLN
12.5000 mg | Freq: Once | INTRAMUSCULAR | Status: DC
  Filled 2017-04-17 (×2): qty 1

## 2017-04-17 MED ORDER — METOCLOPRAMIDE HCL 5 MG/ML IJ SOLN
10.0000 mg | Freq: Three times a day (TID) | INTRAMUSCULAR | Status: DC
  Administered 2017-04-17 – 2017-04-18 (×3): 10 mg via INTRAVENOUS
  Filled 2017-04-17 (×3): qty 2

## 2017-04-17 MED ORDER — INSULIN GLARGINE 100 UNIT/ML ~~LOC~~ SOLN
20.0000 [IU] | Freq: Every day | SUBCUTANEOUS | Status: DC
  Administered 2017-04-17 – 2017-04-18 (×2): 20 [IU] via SUBCUTANEOUS
  Filled 2017-04-17 (×2): qty 0.2

## 2017-04-17 MED ORDER — INSULIN ASPART 100 UNIT/ML ~~LOC~~ SOLN
0.0000 [IU] | Freq: Three times a day (TID) | SUBCUTANEOUS | Status: DC
  Administered 2017-04-18 – 2017-04-19 (×3): 1 [IU] via SUBCUTANEOUS

## 2017-04-17 MED ORDER — HYDRALAZINE HCL 20 MG/ML IJ SOLN
10.0000 mg | Freq: Once | INTRAMUSCULAR | Status: AC
  Administered 2017-04-17: 10 mg via INTRAVENOUS
  Filled 2017-04-17: qty 1

## 2017-04-17 MED ORDER — HYDROCOD POLST-CPM POLST ER 10-8 MG/5ML PO SUER
5.0000 mL | Freq: Once | ORAL | Status: AC
  Administered 2017-04-17: 5 mL via ORAL
  Filled 2017-04-17: qty 5

## 2017-04-17 NOTE — Progress Notes (Signed)
Pt BP still elevated despite 10 mg of hydralazine. continues to vomit despite NPO status. Pt emesis mucous like with red streaks. NP notified. Additional dose of hydralazine given and NP states pt will be hypertensive until vomiting subsides. Will continue to monitor.

## 2017-04-17 NOTE — Progress Notes (Signed)
Pt c/o nausea, vomited x 1, refused PRN nausea meds. He believes his nausea is related to cough, no PRN cough meds ordered. Schorr, NP paged.

## 2017-04-17 NOTE — Progress Notes (Signed)
PROGRESS NOTE        PATIENT DETAILS Name: Jerry Arnold Age: 32 y.o. Sex: male Date of Birth: 1985/02/24 Admit Date: 04/11/2017 Admitting Physician Pearson Grippe, MD ZOX:[id number], No Pcp Per  Brief Narrative: Patient is a 31 y.o. male with history of insulin-dependent diabetes-presented with acute ostium myelitis and gangrenous changes of the right second toe, patient was evaluated by orthopedics and underwent a right second ray amputation.  Postoperative hospital course was complicated by intractable nausea with vomiting, and acute kidney injury.  See below for further details  Subjective: Continues to have nausea and vomiting.  Denies any abdominal pain.  Last bowel movement was yesterday.  Assessment/Plan: Right second toe osteomyelitis with gangrene: Initially on broad-spectrum antimicrobial therapy-evaluated by orthopedics and underwent secondary amputation on 2/23.  Wound cultures positive for group B strep and MSSA-since orthopedics (see note on 2/25) recommending only a few days of antimicrobial therapy postoperatively-we will go ahead and discontinue antimicrobial therapy and follow.  Intractable nausea with vomiting: Denies any abdominal pain, lipase negative-had BM 2/26-x-ray abdomen on 2/26- for bowel obstruction-wonder if he has undiagnosed gastroparesis (does acknowledge vomiting 2-3 times a year as outpatient as well that resolves on its own) worsened by narcotic/anesthesia/acute illness.  Start scheduled Reglan-keep n.p.o. and follow.  Acute kidney injury: Likely hemodynamically mediated in the setting of vomiting-renal ultrasound negative-UA without significant proteinuria-volume status appears to be stable-hopefully creatinine will plateau soon and start downtrending.  Continue to avoid nephrotoxic agents-follow renal function.  Continue gentle hydration.  Insulin-dependent diabetes: Since n.p.o.-mildly hypoglycemic earlier this morning-will decrease  Lantus to 20 units.  Follow CBGs and adjust accordingly  DVT Prophylaxis: Prophylactic Heparin   Code Status: Full code   Family Communication: Aunt at bedside  Disposition Plan: Remain inpatient-requires several more days of hospital  Antimicrobial agents: Anti-infectives (From admission, onward)   Start     Dose/Rate Route Frequency Ordered Stop   04/15/17 0900  cefTRIAXone (ROCEPHIN) 2 g in sodium chloride 0.9 % 100 mL IVPB     2 g 200 mL/hr over 30 Minutes Intravenous Every 24 hours 04/15/17 0827     04/13/17 1345  ceFAZolin (ANCEF) IVPB 2g/100 mL premix     2 g 200 mL/hr over 30 Minutes Intravenous To Surgery 04/13/17 1334 04/14/17 1345   04/12/17 0100  vancomycin (VANCOCIN) IVPB 1000 mg/200 mL premix  Status:  Discontinued     1,000 mg 200 mL/hr over 60 Minutes Intravenous Every 8 hours 04/11/17 2218 04/15/17 0827   04/11/17 2300  piperacillin-tazobactam (ZOSYN) IVPB 3.375 g  Status:  Discontinued     3.375 g 12.5 mL/hr over 240 Minutes Intravenous Every 8 hours 04/11/17 2218 04/15/17 0827   04/11/17 1800  vancomycin (VANCOCIN) 2,000 mg in sodium chloride 0.9 % 500 mL IVPB     2,000 mg 250 mL/hr over 120 Minutes Intravenous  Once 04/11/17 1715 04/11/17 2250   04/11/17 1730  piperacillin-tazobactam (ZOSYN) IVPB 3.375 g     3.375 g 100 mL/hr over 30 Minutes Intravenous  Once 04/11/17 1715 04/11/17 1849      Procedures: 2/23 SECOND FOOT RAY AMPUTATION  CONSULTS:  orthopedic surgery  Time spent: 25- minutes-Greater than 50% of this time was spent in counseling, explanation of diagnosis, planning of further management, and coordination of care.  MEDICATIONS: Scheduled Meds: . feeding supplement (PRO-STAT SUGAR FREE 64)  30  mL Oral BID  . insulin aspart  0-20 Units Subcutaneous Q6H  . insulin glargine  30 Units Subcutaneous QHS  . metoCLOPramide (REGLAN) injection  10 mg Intravenous Q8H  . ondansetron (ZOFRAN) IV  4 mg Intravenous Q6H  . polyethylene glycol  17 g  Oral BID  . promethazine  12.5 mg Intravenous Once   Continuous Infusions: . sodium chloride 200 mL/hr at 04/17/17 1120  . cefTRIAXone (ROCEPHIN)  IV Stopped (04/17/17 1011)   PRN Meds:.acetaminophen **OR** acetaminophen, alum & mag hydroxide-simeth, bisacodyl, HYDROmorphone (DILAUDID) injection, oxyCODONE, oxyCODONE, prochlorperazine   PHYSICAL EXAM: Vital signs: Vitals:   04/16/17 2122 04/17/17 0023 04/17/17 0545 04/17/17 0547  BP: (!) 186/114 (!) 187/114  (!) 169/100  Pulse: 85 91  93  Resp: 16   18  Temp: 98.3 F (36.8 C)   98.7 F (37.1 C)  TempSrc: Oral   Oral  SpO2: 97%   98%  Weight:   108.9 kg (240 lb 1.3 oz)   Height:       Filed Weights   04/14/17 0443 04/15/17 0506 04/17/17 0545  Weight: 105.4 kg (232 lb 5.8 oz) 111.7 kg (246 lb 4.1 oz) 108.9 kg (240 lb 1.3 oz)   Body mass index is 31.67 kg/m.   General appearance :Awake, alert, not in any distress.  Eyes:, pupils equally reactive to light and accomodation,no scleral icterus. HEENT: Atraumatic and Normocephalic Neck: supple, no JVD. No cervical lymphadenopathy.  Resp:Good air entry bilaterally, no added sounds  CVS: S1 S2 regular, no murmurs.  GI: Bowel sounds present, Non tender and not distended with no gaurding, rigidity or rebound.No organomegaly Extremities: B/L Lower Ext shows no edema, both legs are warm to touch Neurology:  speech clear,Non focal, sensation is grossly intact. Psychiatric: Normal judgment and insight. Alert and oriented x 3. Normal mood. Musculoskeletal:No digital cyanosis Skin:No Rash, warm and dry Wounds:N/A  I have personally reviewed following labs and imaging studies  LABORATORY DATA: CBC: Recent Labs  Lab 04/11/17 1705 04/12/17 0036 04/15/17 0510 04/16/17 1145 04/17/17 0454  WBC 11.3* 10.9* 10.1 11.8* 9.2  NEUTROABS 8.5*  --   --   --   --   HGB 11.8* 11.2* 10.8* 11.4* 10.3*  HCT 36.2* 34.2* 33.0* 34.6* 33.3*  MCV 85.4 85.1 85.3 85.9 86.3  PLT 243 258 377 430* 394     Basic Metabolic Panel: Recent Labs  Lab 04/11/17 1705 04/12/17 0036 04/15/17 0510 04/16/17 0617 04/17/17 0454  NA 135 132* 137 144 146*  K 4.2 3.7 3.7 3.4* 3.5  CL 100* 98* 98* 107 112*  CO2 25 24 26 26 23   GLUCOSE 351* 270* 241* 177* 78  BUN 8 6 8 8 8   CREATININE 1.02 1.00 2.12* 2.20* 2.35*  CALCIUM 8.4* 8.0* 8.8* 8.4* 8.4*    GFR: Estimated Creatinine Clearance: 58.4 mL/min (A) (by C-G formula based on SCr of 2.35 mg/dL (H)).  Liver Function Tests: Recent Labs  Lab 04/11/17 1705 04/12/17 0036 04/16/17 1145 04/17/17 0454  AST 12* 10* 13* 14*  ALT 9* 8* 9* 9*  ALKPHOS 100 86 80 75  BILITOT 0.8 0.5 0.4 0.5  PROT 7.7 7.1 7.8 7.4  ALBUMIN 2.7* 2.5* 2.3* 2.3*   Recent Labs  Lab 04/16/17 1145  LIPASE 21   No results for input(s): AMMONIA in the last 168 hours.  Coagulation Profile: Recent Labs  Lab 04/11/17 1705  INR 1.10    Cardiac Enzymes: No results for input(s): CKTOTAL, CKMB, CKMBINDEX, TROPONINI in the  last 168 hours.  BNP (last 3 results) No results for input(s): PROBNP in the last 8760 hours.  HbA1C: No results for input(s): HGBA1C in the last 72 hours.  CBG: Recent Labs  Lab 04/16/17 2123 04/16/17 2342 04/17/17 0019 04/17/17 0544 04/17/17 1220  GLUCAP 85 68 113* 75 88    Lipid Profile: No results for input(s): CHOL, HDL, LDLCALC, TRIG, CHOLHDL, LDLDIRECT in the last 72 hours.  Thyroid Function Tests: No results for input(s): TSH, T4TOTAL, FREET4, T3FREE, THYROIDAB in the last 72 hours.  Anemia Panel: No results for input(s): VITAMINB12, FOLATE, FERRITIN, TIBC, IRON, RETICCTPCT in the last 72 hours.  Urine analysis:    Component Value Date/Time   COLORURINE COLORLESS (A) 04/17/2017 0900   APPEARANCEUR CLEAR 04/17/2017 0900   LABSPEC 1.004 (L) 04/17/2017 0900   PHURINE 6.0 04/17/2017 0900   GLUCOSEU NEGATIVE 04/17/2017 0900   HGBUR SMALL (A) 04/17/2017 0900   BILIRUBINUR NEGATIVE 04/17/2017 0900   KETONESUR NEGATIVE  04/17/2017 0900   PROTEINUR NEGATIVE 04/17/2017 0900   NITRITE NEGATIVE 04/17/2017 0900   LEUKOCYTESUR NEGATIVE 04/17/2017 0900    Sepsis Labs: Lactic Acid, Venous    Component Value Date/Time   LATICACIDVEN 1.12 04/11/2017 1713    MICROBIOLOGY: Recent Results (from the past 240 hour(s))  Culture, blood (Routine x 2)     Status: None   Collection Time: 04/11/17  5:00 PM  Result Value Ref Range Status   Specimen Description BLOOD LEFT ANTECUBITAL  Final   Special Requests   Final    BOTTLES DRAWN AEROBIC AND ANAEROBIC Blood Culture adequate volume   Culture   Final    NO GROWTH 5 DAYS Performed at [facility], [address] [place], [place] [address]    Report Status 04/16/2017 FINAL  Final  Culture, blood (Routine x 2)     Status: None   Collection Time: 04/11/17  5:20 PM  Result Value Ref Range Status   Specimen Description BLOOD RIGHT ANTECUBITAL  Final   Special Requests   Final    BOTTLES DRAWN AEROBIC AND ANAEROBIC Blood Culture results may not be optimal due to an excessive volume of blood received in culture bottles   Culture   Final    NO GROWTH 5 DAYS Performed at [facility], [address] [place], [place] [address]    Report Status 04/16/2017 FINAL  Final  Surgical pcr screen     Status: Abnormal   Collection Time: 04/12/17  3:47 AM  Result Value Ref Range Status   MRSA, PCR NEGATIVE NEGATIVE Final   Staphylococcus aureus POSITIVE (A) NEGATIVE Final    Comment: (NOTE) The Xpert SA Assay (FDA approved for NASAL specimens in patients 922 years of age and older), is one component of a comprehensive surveillance program. It is not intended to diagnose infection nor to guide or monitor treatment. Performed at [facility], [address] [place], [place] [address]   Aerobic/Anaerobic Culture (surgical/deep wound)     Status: None (Preliminary result)   Collection Time: 04/13/17  8:10 AM  Result Value Ref Range Status   Specimen  Description ULCER RIGHT FOOT  Final   Special Requests NONE  Final   Gram Stain   Final    FEW WBC PRESENT, PREDOMINANTLY PMN RARE GRAM POSITIVE COCCI IN PAIRS RARE GRAM POSITIVE RODS    Culture   Final    FEW GROUP B STREP(S.AGALACTIAE)ISOLATED TESTING AGAINST S. AGALACTIAE NOT ROUTINELY PERFORMED DUE TO PREDICTABILITY  OF AMP/PEN/VAN SUSCEPTIBILITY. RARE STAPHYLOCOCCUS AUREUS NO ANAEROBES ISOLATED; CULTURE IN PROGRESS FOR 5 DAYS    Report Status PENDING  Incomplete   Organism ID, Bacteria STAPHYLOCOCCUS AUREUS  Final      Susceptibility   Staphylococcus aureus - MIC*    CIPROFLOXACIN <=0.5 SENSITIVE Sensitive     ERYTHROMYCIN >=8 RESISTANT Resistant     GENTAMICIN <=0.5 SENSITIVE Sensitive     OXACILLIN 0.5 SENSITIVE Sensitive     TETRACYCLINE <=1 SENSITIVE Sensitive     VANCOMYCIN 1 SENSITIVE Sensitive     TRIMETH/SULFA <=10 SENSITIVE Sensitive     CLINDAMYCIN >=8 RESISTANT Resistant     RIFAMPIN <=0.5 SENSITIVE Sensitive     Inducible Clindamycin Value in next row Sensitive      NEGATIVEPerformed at [facility], [address], [place], [place] [address]    * RARE STAPHYLOCOCCUS AUREUS  Culture, blood (Routine X 2) w Reflex to ID Panel     Status: None (Preliminary result)   Collection Time: 04/13/17  2:29 PM  Result Value Ref Range Status   Specimen Description BLOOD RIGHT ANTECUBITAL  Final   Special Requests   Final    BOTTLES DRAWN AEROBIC AND ANAEROBIC Blood Culture adequate volume   Culture   Final    NO GROWTH 4 DAYS Performed at [facility], [address] [place], [place] [address]    Report Status PENDING  Incomplete  Culture, blood (Routine X 2) w Reflex to ID Panel     Status: None (Preliminary result)   Collection Time: 04/13/17  2:30 PM  Result Value Ref Range Status   Specimen Description BLOOD LEFT HAND  Final   Special Requests   Final    BOTTLES DRAWN AEROBIC AND ANAEROBIC Blood Culture adequate volume   Culture   Final    NO GROWTH 4  DAYS Performed at [facility], [address] [place], [place] [address]    Report Status PENDING  Incomplete    RADIOLOGY STUDIES/RESULTS: US Renal  Result Date: 04/17/2017 CLINICAL DATA:  Acute renal injury.  Osteomyelitis, diabetes. EXAM: RENAL / URINARY TRACT ULTRASOUND COMPLETE COMPARISON:  None in PACs FINDINGS: Right Kidney: Length: 14.1 cm. The renal cortical echotexture exceeds that of the adjacent liver. There is no hydronephrosis or cystic or solid mass. Left Kidney: Length: 14.9 cm. The renal cortical echotexture is increased similar to that on the right. There is no hydronephrosis or cystic or solid mass. Bladder: Appears normal for degree of bladder distention. IMPRESSION: Increased renal cortical echotexture compatible with medical renal disease. No hydronephrosis. Electronically Signed   By: David  [place] M.D.   On: 04/17/2017 09:34   Dg Abd Portable 1v  Result Date: 04/16/2017 CLINICAL DATA:  Vomiting for the past 3 days. EXAM: PORTABLE ABDOMEN - 1 VIEW COMPARISON:  None in PACs FINDINGS: A single supine view of the abdomen and pelvis is reviewed. The hemidiaphragms are excluded from the study. The bowel gas pattern is within the limits of normal. There is no evidence of ileus or obstruction. No free extraluminal gas collections are observed. There are no abnormal soft tissue calcifications. The bony structures are unremarkable. IMPRESSION: No acute intra-abdominal abnormality is observed. Electronically Signed   By: David  [place] M.D.   On: 04/16/2017 15:53   Dg Foot Complete Right  Result Date: 04/11/2017 CLINICAL DATA:  Pt has necrotic 2 toe on his right foot. PT states his toe nail fell of at the end of January,  after that he notices some swelling and pain in toe. His toe became infected and he tried to treat at home. Pt is has fever chills and some nv. EXAM: RIGHT FOOT COMPLETE - 3+ VIEW COMPARISON:  None. FINDINGS: There is erosion of the tuft of the distal phalanx  second digit back to the level of the proximal metaphysis. No subcutaneous gas clearly noted. IMPRESSION: Osteomyelitis of the distal phalanx second digit Electronically Signed   By: Genevive Bi M.D.   On: 04/11/2017 17:23     LOS: 6 days   Jeoffrey Massed, MD  [facility] Pager:[phone]  If 7PM-7AM, please contact night-coverage [email or web address] Password [facility] 04/17/2017, 2:13 PM

## 2017-04-17 NOTE — Progress Notes (Signed)
Physical Therapy Treatment Patient Details Name: Jerry RangSamuel Cashin MRN: [id number] DOB: May 10, 1985 Today's Date: 04/17/2017    History of Present Illness 31yom PMH DM presented with right oe pain, admitted for acute osteomyelitis and gangrene right second toe. 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 Pt able to mount/dismount knee scooter with minA and able to ambulate 100 feet with min guard assist. Pt is making slow progress towards his goals and d/c recommendations remain appropriate.     Follow Up Recommendations  No PT follow up;Supervision for mobility/OOB     Equipment Recommendations  Rolling walker with 5" wheels       Precautions / Restrictions Precautions Precautions: None Restrictions Weight Bearing Restrictions: Yes RLE Weight Bearing: Non weight bearing    Mobility  Bed Mobility               General bed mobility comments: OOB in straight chair with R foot propped up on bed   Transfers Overall transfer level: Needs assistance Equipment used: (knee scooter)   Sit to Stand: Min assist         General transfer comment: minA for power up to [employer] Scooter, vc for break engagement and proper mounting of device  Ambulation/Gait Ambulation/Gait assistance: Min guard Ambulation Distance (Feet): 100 Feet Assistive device: (knee scooter) Gait Pattern/deviations: Step-to pattern Gait velocity: decreased Gait velocity interpretation: Below normal speed for age/gender General Gait Details: hands on min guard for safety, vc for brake usage and speed regulation, pt required 2x standing rest breaks for increase in nausea      Balance Overall balance assessment: Needs assistance Sitting-balance support: No upper extremity supported;Feet supported Sitting balance-Leahy Scale: Normal     Standing balance support: Bilateral upper extremity supported Standing balance-Leahy  Scale: Fair                              Cognition Arousal/Alertness: Awake/alert Behavior During Therapy: WFL for tasks assessed/performed Overall Cognitive Status: Within Functional Limits for tasks assessed                                        Exercises      General Comments General comments (skin integrity, edema, etc.): Mother present during treatment      Pertinent Vitals/Pain Pain Assessment: No/denies pain    Home Living                      Prior Function            PT Goals (current goals can now be found in the care plan section) Acute Rehab PT Goals PT Goal Formulation: With patient Time For Goal Achievement: 04/26/17 Potential to Achieve Goals: Good Progress towards PT goals: Progressing toward goals    Frequency    Min 3X/week      PT Plan Current plan remains appropriate    Co-evaluation              AM-PAC PT "6 Clicks" Daily Activity  Outcome Measure  Difficulty turning over in bed (including adjusting bedclothes, sheets and blankets)?: None Difficulty moving from lying on back to sitting on the side of the bed? : None Difficulty sitting down on and standing up from a  chair with arms (e.g., wheelchair, bedside commode, etc,.)?: None Help needed moving to and from a bed to chair (including a wheelchair)?: A Little Help needed walking in hospital room?: A Little Help needed climbing 3-5 steps with a railing? : A Lot 6 Click Score: 20    End of Session Equipment Utilized During Treatment: Gait belt Activity Tolerance: Other (comment);Patient limited by fatigue(Pt limited by nausea) Patient left: in chair;with call bell/phone within reach;with family/visitor present Nurse Communication: Mobility status PT Visit Diagnosis: Unsteadiness on feet (R26.81) Pain - Right/Left: Right Pain - part of body: Ankle and joints of foot     Time: 1559-1615 PT Time Calculation (min) (ACUTE ONLY): 16  min  Charges:  $Gait Training: 8-22 mins                    G Codes:       [place] B. Beverely Risen PT, DPT Acute Rehabilitation  [phone] Pager [phone]     Elon Alas [facility] 04/17/2017, 4:42 PM

## 2017-04-17 NOTE — Progress Notes (Signed)
Inpatient Diabetes Program Recommendations  AACE/ADA: New Consensus Statement on Inpatient Glycemic Control (2015)  Target Ranges:  Prepandial:   less than 140 mg/dL      Peak postprandial:   less than 180 mg/dL (1-2 hours)      Critically ill patients:  140 - 180 mg/dL   Lab Results  Component Value Date   GLUCAP 88 04/17/2017   HGBA1C 14.0 (H) 04/12/2017    Review of Glycemic ControlResults for Jerry RangMILFORD, Jerry (MRN [id number]) as of 04/17/2017 13:39  Ref. Range 04/16/2017 21:23 04/16/2017 23:42 04/17/2017 00:19 04/17/2017 05:44 04/17/2017 12:20  Glucose-Capillary Latest Ref Range: 65 - 99 mg/dL 85 68 [id number] (H) 75 88   Diabetes history: DM 2 Outpatient Diabetes medications: Novolog 70/30- 50 units every other day, Metformin 500 mg bid Current orders for Inpatient glycemic control:  Novolog resistant q 6 hours, Lantus 30 units q HS  Inpatient Diabetes Program Recommendations:   Consider reducing Lantus to 15 units daily.  Also consider reducing Novolog correction to sensitive.    Thanks,  Beryl MeagerJenny Lavita Pontius, RN, BC-ADM Inpatient Diabetes Coordinator Pager [phone] (8a-5p)

## 2017-04-17 NOTE — Progress Notes (Signed)
Pt BP elevated at 169/99, other VS WDL. Schorr, NP notified.

## 2017-04-18 ENCOUNTER — Inpatient Hospital Stay (HOSPITAL_COMMUNITY): Payer: Self-pay | Admitting: Anesthesiology

## 2017-04-18 ENCOUNTER — Inpatient Hospital Stay (HOSPITAL_COMMUNITY): Payer: Self-pay

## 2017-04-18 DIAGNOSIS — G43A1 Cyclical vomiting, intractable: Secondary | ICD-10-CM

## 2017-04-18 DIAGNOSIS — R739 Hyperglycemia, unspecified: Secondary | ICD-10-CM

## 2017-04-18 LAB — BASIC METABOLIC PANEL
Anion gap: 11 (ref 5–15)
BUN: 8 mg/dL (ref 6–20)
CO2: 24 mmol/L (ref 22–32)
Calcium: 8.3 mg/dL — ABNORMAL LOW (ref 8.9–10.3)
Chloride: 108 mmol/L (ref 101–111)
Creatinine, Ser: 2.28 mg/dL — ABNORMAL HIGH (ref 0.61–1.24)
GFR calc Af Amer: 42 mL/min — ABNORMAL LOW (ref >60)
GFR calc non Af Amer: 36 mL/min — ABNORMAL LOW (ref >60)
Glucose, Bld: 94 mg/dL (ref 65–99)
Potassium: 3.1 mmol/L — ABNORMAL LOW (ref 3.5–5.1)
Sodium: 143 mmol/L (ref 135–145)

## 2017-04-18 LAB — AEROBIC/ANAEROBIC CULTURE W GRAM STAIN (SURGICAL/DEEP WOUND)

## 2017-04-18 LAB — GLUCOSE, CAPILLARY
Glucose-Capillary: 75 mg/dL (ref 65–99)
Glucose-Capillary: 89 mg/dL (ref 65–99)
Glucose-Capillary: 81 mg/dL (ref 65–99)
Glucose-Capillary: 123 mg/dL — ABNORMAL HIGH (ref 65–99)
Glucose-Capillary: 145 mg/dL — ABNORMAL HIGH (ref 65–99)
Glucose-Capillary: 133 mg/dL — ABNORMAL HIGH (ref 65–99)

## 2017-04-18 LAB — CULTURE, BLOOD (ROUTINE X 2)
Culture: NO GROWTH
Culture: NO GROWTH
Special Requests: ADEQUATE
Special Requests: ADEQUATE

## 2017-04-18 MED ORDER — PROMETHAZINE HCL 25 MG/ML IJ SOLN
12.5000 mg | Freq: Four times a day (QID) | INTRAMUSCULAR | Status: DC
  Administered 2017-04-18 – 2017-04-19 (×3): 12.5 mg via INTRAVENOUS
  Filled 2017-04-18 (×2): qty 1

## 2017-04-18 MED ORDER — PANTOPRAZOLE SODIUM 40 MG IV SOLR
40.0000 mg | Freq: Two times a day (BID) | INTRAVENOUS | Status: DC
  Administered 2017-04-18 – 2017-04-19 (×2): 40 mg via INTRAVENOUS
  Filled 2017-04-18 (×2): qty 40

## 2017-04-18 MED ORDER — POTASSIUM CHLORIDE CRYS ER 20 MEQ PO TBCR
40.0000 meq | EXTENDED_RELEASE_TABLET | Freq: Once | ORAL | Status: AC
  Administered 2017-04-18: 40 meq via ORAL
  Filled 2017-04-18: qty 2

## 2017-04-18 MED ORDER — PANTOPRAZOLE SODIUM 40 MG IV SOLR: 40.0000 mg | INTRAVENOUS | Status: DC

## 2017-04-18 MED ORDER — SODIUM CHLORIDE 0.9 % IV SOLN
INTRAVENOUS | Status: DC
  Administered 2017-04-18: 15:00:00 via INTRAVENOUS
  Filled 2017-04-18 (×3): qty 1000

## 2017-04-18 MED ORDER — METOCLOPRAMIDE HCL 5 MG/ML IJ SOLN
10.0000 mg | Freq: Four times a day (QID) | INTRAMUSCULAR | Status: DC
  Administered 2017-04-18 – 2017-04-19 (×4): 10 mg via INTRAVENOUS
  Filled 2017-04-18 (×4): qty 2

## 2017-04-18 MED ORDER — ONDANSETRON HCL 4 MG/2ML IJ SOLN
4.0000 mg | Freq: Three times a day (TID) | INTRAMUSCULAR | Status: DC
  Administered 2017-04-18: 4 mg via INTRAVENOUS
  Filled 2017-04-18: qty 2

## 2017-04-18 MED ORDER — AMLODIPINE BESYLATE 5 MG PO TABS
5.0000 mg | ORAL_TABLET | Freq: Every day | ORAL | Status: DC
  Administered 2017-04-18: 5 mg via ORAL
  Filled 2017-04-18: qty 1

## 2017-04-18 MED ORDER — HYDRALAZINE HCL 20 MG/ML IJ SOLN
10.0000 mg | Freq: Once | INTRAMUSCULAR | Status: AC
  Administered 2017-04-18: 10 mg via INTRAVENOUS
  Filled 2017-04-18: qty 1

## 2017-04-18 NOTE — Progress Notes (Signed)
Physical Therapy Treatment Patient Details Name: Jerry Arnold MRN: [id number] DOB: 10-Dec-1985 Today's Date: 04/18/2017    History of Present Illness 31yom PMH DM presented with right oe pain, admitted for acute osteomyelitis and gangrene right second toe. 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 Pt is currently Mod I for bed mobility, supervision for transfers and ambulation of 500 feet with knee scooter. Pt with no complaints of nausea during ambulation and feeling much better. PT recommendations continue to be appropriate.     Follow Up Recommendations  No PT follow up;Supervision for mobility/OOB     Equipment Recommendations  Rolling walker with 5" wheels    Recommendations for Other Services       Precautions / Restrictions Precautions Precautions: None Restrictions Weight Bearing Restrictions: Yes RLE Weight Bearing: Non weight bearing    Mobility  Bed Mobility Overal bed mobility: Modified Independent                Transfers Overall transfer level: Needs assistance Equipment used: (knee scooter)   Sit to Stand: Supervision         General transfer comment: supervision for safety  Ambulation/Gait Ambulation/Gait assistance: Supervision Ambulation Distance (Feet): 500 Feet Assistive device: (knee scooter) Gait Pattern/deviations: Step-to pattern Gait velocity: decreased Gait velocity interpretation: Below normal speed for age/gender General Gait Details: supervision for safety, vc for navigation around obstacles in hallway       Balance Overall balance assessment: Needs assistance Sitting-balance support: No upper extremity supported;Feet supported Sitting balance-Leahy Scale: Normal     Standing balance support: Single extremity supported Standing balance-Leahy Scale: Fair                              Cognition Arousal/Alertness: Awake/alert Behavior During  Therapy: WFL for tasks assessed/performed Overall Cognitive Status: Within Functional Limits for tasks assessed                                           General Comments General comments (skin integrity, edema, etc.): Mother present during session      Pertinent Vitals/Pain Pain Assessment: No/denies pain           PT Goals (current goals can now be found in the care plan section) Acute Rehab PT Goals PT Goal Formulation: With patient Time For Goal Achievement: 04/26/17 Potential to Achieve Goals: Good Progress towards PT goals: Progressing toward goals    Frequency    Min 3X/week      PT Plan Current plan remains appropriate       AM-PAC PT "6 Clicks" Daily Activity  Outcome Measure  Difficulty turning over in bed (including adjusting bedclothes, sheets and blankets)?: None Difficulty moving from lying on back to sitting on the side of the bed? : None Difficulty sitting down on and standing up from a chair with arms (e.g., wheelchair, bedside commode, etc,.)?: None Help needed moving to and from a bed to chair (including a wheelchair)?: A Little Help needed walking in hospital room?: A Little Help needed climbing 3-5 steps with a railing? : A Lot 6 Click Score: 20    End of Session Equipment Utilized During Treatment: Gait belt Activity Tolerance: Other (comment);Patient limited by fatigue(Pt limited by nausea) Patient left: in chair;with  call bell/phone within reach;with family/visitor present Nurse Communication: Mobility status PT Visit Diagnosis: Unsteadiness on feet (R26.81) Pain - Right/Left: Right Pain - part of body: Ankle and joints of foot     Time: 1550-1608 PT Time Calculation (min) (ACUTE ONLY): 18 min  Charges:  $Gait Training: 8-22 mins                    G Codes:       Janeen Watson B. Beverely Risen PT, DPT Acute Rehabilitation  [phone] Pager [phone]     Elon Alas [place] 04/18/2017, 5:50 PM

## 2017-04-18 NOTE — Progress Notes (Signed)
PROGRESS NOTE        PATIENT DETAILS Name: Jerry Arnold Age: 32 y.o. Sex: male Date of Birth: 1985-03-22 Admit Date: 04/11/2017 Admitting Physician Pearson Grippe, MD [id number], No Pcp Per  Brief Narrative: Patient is a 32 y.o. male with history of insulin-dependent diabetes-presented with acute ostium myelitis and gangrenous changes of the right second toe, patient was evaluated by orthopedics and underwent a right second ray amputation. Postoperative hospital course was complicated by intractable nausea with vomiting, and acute kidney injury.  See below for further details  Subjective: Thinks that vomiting may have slowed down after initiation of Reglan-claims that he coughs and starts vomiting.  Denies any abdominal pain.  Last BM was this morning.  Assessment/Plan: Right second toe osteomyelitis with gangrene: Initially on broad-spectrum antimicrobial therapy-evaluated by orthopedics and underwent secondary amputation on 2/23.  Wound cultures positive for group B strep and MSSA-since orthopedics (see note on 2/25) recommending only a few days of antimicrobial therapy postoperatively-all antimicrobial therapy has been discontinued as of 2/27.  Intractable nausea with vomiting: Appears to have intractable nausea with vomiting-started on empiric Reglan on 2/27 for presumed gastroparesis (long-standing history of diabetes).  Since still continues to vomit-will start scheduled Reglan as well.  Recent abdominal x-ray negative for any acute abnormalities.  Denies any abdominal pain.  Since continues to have vomiting in spite of scheduled antiemetics-no etiology evident-we will go ahead and consult gastroenterology.   Acute kidney injury: Likely hemodynamically mediated in the setting of vomiting-renal ultrasound negative-UA without significant proteinuria-volume status appears to be stable-creatinine seems to have plateaued and is now downtrending.  Continue to avoid  nephrotoxic agents, continue gentle hydration.  .  Insulin-dependent diabetes: Since n.p.o.-have decreased Lantus to 15 units and SSI to sensitive scale.  Continue with this regimen for another 24 hours before adjusting any further.  DVT Prophylaxis: Prophylactic Heparin   Code Status: Full code   Family Communication: Aunt at bedside  Disposition Plan: Remain inpatient-requires several more days of hospital  Antimicrobial agents: Anti-infectives (From admission, onward)   Start     Dose/Rate Route Frequency Ordered Stop   04/15/17 0900  cefTRIAXone (ROCEPHIN) 2 g in sodium chloride 0.9 % 100 mL IVPB  Status:  Discontinued     2 g 200 mL/hr over 30 Minutes Intravenous Every 24 hours 04/15/17 0827 04/17/17 1423   04/13/17 1345  ceFAZolin (ANCEF) IVPB 2g/100 mL premix     2 g 200 mL/hr over 30 Minutes Intravenous To Surgery 04/13/17 1334 04/14/17 1345   04/12/17 0100  vancomycin (VANCOCIN) IVPB 1000 mg/200 mL premix  Status:  Discontinued     1,000 mg 200 mL/hr over 60 Minutes Intravenous Every 8 hours 04/11/17 2218 04/15/17 0827   04/11/17 2300  piperacillin-tazobactam (ZOSYN) IVPB 3.375 g  Status:  Discontinued     3.375 g 12.5 mL/hr over 240 Minutes Intravenous Every 8 hours 04/11/17 2218 04/15/17 0827   04/11/17 1800  vancomycin (VANCOCIN) 2,000 mg in sodium chloride 0.9 % 500 mL IVPB     2,000 mg 250 mL/hr over 120 Minutes Intravenous  Once 04/11/17 1715 04/11/17 2250   04/11/17 1730  piperacillin-tazobactam (ZOSYN) IVPB 3.375 g     3.375 g 100 mL/hr over 30 Minutes Intravenous  Once 04/11/17 1715 04/11/17 1849      Procedures: 2/23 SECOND FOOT RAY AMPUTATION  CONSULTS:  orthopedic  surgery  Time spent: 25- minutes-Greater than 50% of this time was spent in counseling, explanation of diagnosis, planning of further management, and coordination of care.  MEDICATIONS: Scheduled Meds: . amLODipine  5 mg Oral Daily  . feeding supplement (PRO-STAT SUGAR FREE 64)  30 mL  Oral BID  . heparin injection (subcutaneous)  5,000 Units Subcutaneous Q8H  . insulin aspart  0-9 Units Subcutaneous TID WC  . insulin glargine  20 Units Subcutaneous QHS  . metoCLOPramide (REGLAN) injection  10 mg Intravenous Q6H  . ondansetron (ZOFRAN) IV  4 mg Intravenous Q8H  . pantoprazole (PROTONIX) IV  40 mg Intravenous Q24H  . polyethylene glycol  17 g Oral BID  . promethazine  12.5 mg Intravenous Once   Continuous Infusions: . sodium chloride 125 mL/hr at 04/18/17 0918   PRN Meds:.acetaminophen **OR** acetaminophen, alum & mag hydroxide-simeth, bisacodyl, HYDROmorphone (DILAUDID) injection, prochlorperazine   PHYSICAL EXAM: Vital signs: Vitals:   04/18/17 0500 04/18/17 0506 04/18/17 0634 04/18/17 0915  BP:  (!) 174/114 (!) 160/90 (!) 182/108  Pulse:  91 88 89  Resp:  16  16  Temp:  99 F (37.2 C)  98.7 F (37.1 C)  TempSrc:  Oral  Oral  SpO2:  100%  99%  Weight: 107.4 kg (236 lb 12.8 oz)     Height:       Filed Weights   04/15/17 0506 04/17/17 0545 04/18/17 0500  Weight: 111.7 kg (246 lb 4.1 oz) 108.9 kg (240 lb 1.3 oz) 107.4 kg (236 lb 12.8 oz)   Body mass index is 31.24 kg/m.   General appearance :Awake, alert, not in any distress.  Eyes:, pupils equally reactive to light and accomodation,no scleral icterus. HEENT: Atraumatic and Normocephalic Neck: supple, no JVD. Resp:Good air entry bilaterally, no rales or rhonchi CVS: S1 S2 regular, no murmurs.  GI: Bowel sounds present, Non tender and not distended with no gaurding, rigidity or rebound. Extremities: B/L Lower Ext shows no edema, both legs are warm to touch Neurology:  speech clear,Non focal, sensation is grossly intact. Psychiatric: Normal judgment and insight. Normal mood. Musculoskeletal:No digital cyanosis Skin:No Rash, warm and dry Wounds:N/A  I have personally reviewed following labs and imaging studies  LABORATORY DATA: CBC: Recent Labs  Lab 04/11/17 1705 04/12/17 0036 04/15/17 0510  04/16/17 1145 04/17/17 0454  WBC 11.3* 10.9* 10.1 11.8* 9.2  NEUTROABS 8.5*  --   --   --   --   HGB 11.8* 11.2* 10.8* 11.4* 10.3*  HCT 36.2* 34.2* 33.0* 34.6* 33.3*  MCV 85.4 85.1 85.3 85.9 86.3  PLT 243 258 377 430* 394    Basic Metabolic Panel: Recent Labs  Lab 04/12/17 0036 04/15/17 0510 04/16/17 0617 04/17/17 0454 04/18/17 0425  NA 132* 137 144 146* 143  K 3.7 3.7 3.4* 3.5 3.1*  CL 98* 98* 107 112* 108  CO2 24 26 26 23 24   GLUCOSE 270* 241* 177* 78 94  BUN 6 8 8 8 8   CREATININE 1.00 2.12* 2.20* 2.35* 2.28*  CALCIUM 8.0* 8.8* 8.4* 8.4* 8.3*    GFR: Estimated Creatinine Clearance: 59.8 mL/min (A) (by C-G formula based on SCr of 2.28 mg/dL (H)).  Liver Function Tests: Recent Labs  Lab 04/11/17 1705 04/12/17 0036 04/16/17 1145 04/17/17 0454  AST 12* 10* 13* 14*  ALT 9* 8* 9* 9*  ALKPHOS 100 86 80 75  BILITOT 0.8 0.5 0.4 0.5  PROT 7.7 7.1 7.8 7.4  ALBUMIN 2.7* 2.5* 2.3* 2.3*   Recent  Labs  Lab 04/16/17 1145  LIPASE 21   No results for input(s): AMMONIA in the last 168 hours.  Coagulation Profile: Recent Labs  Lab 04/11/17 1705  INR 1.10    Cardiac Enzymes: No results for input(s): CKTOTAL, CKMB, CKMBINDEX, TROPONINI in the last 168 hours.  BNP (last 3 results) No results for input(s): PROBNP in the last 8760 hours.  HbA1C: No results for input(s): HGBA1C in the last 72 hours.  CBG: Recent Labs  Lab 04/17/17 1655 04/17/17 2119 04/18/17 0216 04/18/17 0509 04/18/17 0836  GLUCAP 101* 92 75 89 81    Lipid Profile: No results for input(s): CHOL, HDL, LDLCALC, TRIG, CHOLHDL, LDLDIRECT in the last 72 hours.  Thyroid Function Tests: No results for input(s): TSH, T4TOTAL, FREET4, T3FREE, THYROIDAB in the last 72 hours.  Anemia Panel: No results for input(s): VITAMINB12, FOLATE, FERRITIN, TIBC, IRON, RETICCTPCT in the last 72 hours.  Urine analysis:    Component Value Date/Time   COLORURINE COLORLESS (A) 04/17/2017 0900   APPEARANCEUR  CLEAR 04/17/2017 0900   LABSPEC 1.004 (L) 04/17/2017 0900   PHURINE 6.0 04/17/2017 0900   GLUCOSEU NEGATIVE 04/17/2017 0900   HGBUR SMALL (A) 04/17/2017 0900   BILIRUBINUR NEGATIVE 04/17/2017 0900   KETONESUR NEGATIVE 04/17/2017 0900   PROTEINUR NEGATIVE 04/17/2017 0900   NITRITE NEGATIVE 04/17/2017 0900   LEUKOCYTESUR NEGATIVE 04/17/2017 0900    Sepsis Labs: Lactic Acid, Venous    Component Value Date/Time   LATICACIDVEN 1.12 04/11/2017 1713    MICROBIOLOGY: Recent Results (from the past 240 hour(s))  Culture, blood (Routine x 2)     Status: None   Collection Time: 04/11/17  5:00 PM  Result Value Ref Range Status   Specimen Description BLOOD LEFT ANTECUBITAL  Final   Special Requests   Final    BOTTLES DRAWN AEROBIC AND ANAEROBIC Blood Culture adequate volume   Culture   Final    NO GROWTH 5 DAYS Performed at [facility], [address] [place], [place] [address]    Report Status 04/16/2017 FINAL  Final  Culture, blood (Routine x 2)     Status: None   Collection Time: 04/11/17  5:20 PM  Result Value Ref Range Status   Specimen Description BLOOD RIGHT ANTECUBITAL  Final   Special Requests   Final    BOTTLES DRAWN AEROBIC AND ANAEROBIC Blood Culture results may not be optimal due to an excessive volume of blood received in culture bottles   Culture   Final    NO GROWTH 5 DAYS Performed at [facility], [address] [place], [place] [address]    Report Status 04/16/2017 FINAL  Final  Surgical pcr screen     Status: Abnormal   Collection Time: 04/12/17  3:47 AM  Result Value Ref Range Status   MRSA, PCR NEGATIVE NEGATIVE Final   Staphylococcus aureus POSITIVE (A) NEGATIVE Final    Comment: (NOTE) The Xpert SA Assay (FDA approved for NASAL specimens in patients 33 years of age and older), is one component of a comprehensive surveillance program. It is not intended to diagnose infection nor to guide or monitor treatment. Performed at [facility], [address] [place], [place] [address]   Aerobic/Anaerobic Culture (surgical/deep wound)     Status: None   Collection Time: 04/13/17  8:10 AM  Result Value Ref Range Status   Specimen Description ULCER RIGHT FOOT  Final   Special Requests NONE  Final   Gram Stain  Final    FEW WBC PRESENT, PREDOMINANTLY PMN RARE GRAM POSITIVE COCCI IN PAIRS RARE GRAM POSITIVE RODS    Culture   Final    FEW GROUP B STREP(S.AGALACTIAE)ISOLATED TESTING AGAINST S. AGALACTIAE NOT ROUTINELY PERFORMED DUE TO PREDICTABILITY OF AMP/PEN/VAN SUSCEPTIBILITY. RARE STAPHYLOCOCCUS AUREUS NO ANAEROBES ISOLATED Performed at [facility], [address], [place], [place] [address]    Report Status 04/18/2017 FINAL  Final   Organism ID, Bacteria STAPHYLOCOCCUS AUREUS  Final      Susceptibility   Staphylococcus aureus - MIC*    CIPROFLOXACIN <=0.5 SENSITIVE Sensitive     ERYTHROMYCIN >=8 RESISTANT Resistant     GENTAMICIN <=0.5 SENSITIVE Sensitive     OXACILLIN 0.5 SENSITIVE Sensitive     TETRACYCLINE <=1 SENSITIVE Sensitive     VANCOMYCIN 1 SENSITIVE Sensitive     TRIMETH/SULFA <=10 SENSITIVE Sensitive     CLINDAMYCIN >=8 RESISTANT Resistant     RIFAMPIN <=0.5 SENSITIVE Sensitive     Inducible Clindamycin NEGATIVE Sensitive     * RARE STAPHYLOCOCCUS AUREUS  Culture, blood (Routine X 2) w Reflex to ID Panel     Status: None (Preliminary result)   Collection Time: 04/13/17  2:29 PM  Result Value Ref Range Status   Specimen Description BLOOD RIGHT ANTECUBITAL  Final   Special Requests   Final    BOTTLES DRAWN AEROBIC AND ANAEROBIC Blood Culture adequate volume   Culture   Final    NO GROWTH 4 DAYS Performed at [facility], [address], [place], [place] [address]    Report Status PENDING  Incomplete  Culture, blood (Routine X 2) w Reflex to ID Panel     Status: None (Preliminary result)   Collection Time: 04/13/17  2:30 PM  Result Value Ref Range Status   Specimen Description  BLOOD LEFT HAND  Final   Special Requests   Final    BOTTLES DRAWN AEROBIC AND ANAEROBIC Blood Culture adequate volume   Culture   Final    NO GROWTH 4 DAYS Performed at [facility], [address], [place], [place] [address]    Report Status PENDING  Incomplete    RADIOLOGY STUDIES/RESULTS: US Renal  Result Date: 04/17/2017 CLINICAL DATA:  Acute renal injury.  Osteomyelitis, diabetes. EXAM: RENAL / URINARY TRACT ULTRASOUND COMPLETE COMPARISON:  None in PACs FINDINGS: Right Kidney: Length: 14.1 cm. The renal cortical echotexture exceeds that of the adjacent liver. There is no hydronephrosis or cystic or solid mass. Left Kidney: Length: 14.9 cm. The renal cortical echotexture is increased similar to that on the right. There is no hydronephrosis or cystic or solid mass. Bladder: Appears normal for degree of bladder distention. IMPRESSION: Increased renal cortical echotexture compatible with medical renal disease. No hydronephrosis. Electronically Signed   By: David  [place] M.D.   On: 04/17/2017 09:34   Dg Abd Portable 1v  Result Date: 04/16/2017 CLINICAL DATA:  Vomiting for the past 3 days. EXAM: PORTABLE ABDOMEN - 1 VIEW COMPARISON:  None in PACs FINDINGS: A single supine view of the abdomen and pelvis is reviewed. The hemidiaphragms are excluded from the study. The bowel gas pattern is within the limits of normal. There is no evidence of ileus or obstruction. No free extraluminal gas collections are observed. There are no abnormal soft tissue calcifications. The bony structures are unremarkable. IMPRESSION: No acute intra-abdominal abnormality is observed. Electronically Signed   By: David  [place] M.D.   On: 04/16/2017 15:53   Dg  Foot Complete Right  Result Date: 04/11/2017 CLINICAL DATA:  Pt has necrotic 2 toe on his right foot. PT states his toe nail fell of at the end of January, after that he notices some swelling and pain in toe. His toe became infected and he tried to treat at home.  Pt is has fever chills and some nv. EXAM: RIGHT FOOT COMPLETE - 3+ VIEW COMPARISON:  None. FINDINGS: There is erosion of the tuft of the distal phalanx second digit back to the level of the proximal metaphysis. No subcutaneous gas clearly noted. IMPRESSION: Osteomyelitis of the distal phalanx second digit Electronically Signed   By: Genevive Bi M.D.   On: 04/11/2017 17:23     LOS: 7 days   Jeoffrey Massed, MD  [facility] Pager:[phone]  If 7PM-7AM, please contact night-coverage [email or web address] Password [facility] 04/18/2017, 12:05 PM

## 2017-04-18 NOTE — Anesthesia Preprocedure Evaluation (Deleted)
Anesthesia Evaluation  Patient identified by MRN, date of birth, ID band Patient awake    Reviewed: Allergy & Precautions, H&P , NPO status , Patient's Chart, lab work & pertinent test results  Airway Mallampati: III  TM Distance: >3 FB Neck ROM: full    Dental  (+) Dental Advisory Given, Teeth Intact   Pulmonary neg pulmonary ROS,    Pulmonary exam normal breath sounds clear to auscultation       Cardiovascular negative cardio ROS Normal cardiovascular exam Rhythm:Regular Rate:Normal     Neuro/Psych negative neurological ROS  negative psych ROS   GI/Hepatic negative GI ROS, Neg liver ROS,   Endo/Other  diabetes, Type 2, Insulin Dependentobesity  Renal/GU Renal Insufficiency  negative genitourinary   Musculoskeletal negative musculoskeletal ROS (+)   Abdominal   Peds  Hematology  (+) anemia ,   Anesthesia Other Findings Hypokalemia  Reproductive/Obstetrics                            Anesthesia Physical  Anesthesia Plan  ASA: II  Anesthesia Plan: MAC   Post-op Pain Management:    Induction: Intravenous  PONV Risk Score and Plan: Treatment may vary due to age or medical condition and Propofol infusion  Airway Management Planned: Natural Airway and Nasal Cannula  Additional Equipment: None  Intra-op Plan:   Post-operative Plan:   Informed Consent: I have reviewed the patients History and Physical, chart, labs and discussed the procedure including the risks, benefits and alternatives for the proposed anesthesia with the patient or authorized representative who has indicated his/her understanding and acceptance.   Dental advisory given  Plan Discussed with: CRNA  Anesthesia Plan Comments: (Procedure postponed. Patient with consistent BP's of >200/120-130 in preop. Given dose of hydralazine with no significant change in BP. )       Anesthesia Quick Evaluation

## 2017-04-18 NOTE — Progress Notes (Signed)
Inpatient Diabetes Program Recommendations  AACE/ADA: New Consensus Statement on Inpatient Glycemic Control (2015)  Target Ranges:  Prepandial:   less than 140 mg/dL      Peak postprandial:   less than 180 mg/dL (1-2 hours)      Critically ill patients:  140 - 180 mg/dL   Lab Results  Component Value Date   GLUCAP 123 (H) 04/18/2017   HGBA1C 14.0 (H) 04/12/2017    Spoke with patient this afternoon to provide additional education on diabetes management. Reviewed patient's current A1c of 14%. Explained what a A1c is and what it measures. Also reviewed goal A1c with patient, importance of good glucose control @ home, and blood sugar goals. Discussed pathophysiology behind vasculature and increased BS levels, long term co-morbidities, and gastroparesis. Up-to-date handout provided to patient on management, diet, and treatment course.  Also, discussed concerns regarding outpatient management and lack . Patient has good support at home and family who are willing to help financially if necessary. Discussed options for insulin and cost that is associated. Am recommending Novolin 70/30 at discharge. Patient is aware of Relion at [facility], where vial is $25 per vial. Patient Needs meter and test strips. Blood glucose meter kit (includes lancets and strips) #[id number] Discussed [facility] and encouraged to follow up with PCP for his diabetes management.  Patient educated on Medicare and Medicaid; discussed differences and circumstances where patient's qualify. Patient has no further questions at this time.   Thanks, Bronson Curb, MSN, RNC-OB Diabetes Coordinator [phone] (8a-5p)

## 2017-04-18 NOTE — Progress Notes (Signed)
Pt refused scheduled Zofran this morning. After 15 minutes pt has one occurrence of emesis. NPO status maintained.

## 2017-04-18 NOTE — Progress Notes (Signed)
Pt BP elevated at 174/114, HR 91. Schorr, NP notified. Pt asymptomatic.

## 2017-04-18 NOTE — Consult Note (Signed)
[facility] Consult: 11:17 AM 04/18/2017  LOS: 7 days    Referring Provider: Dr Jerral Ralph  Primary Care Physician:  Patient, No Pcp Per Primary Gastroenterologist:  Gentry Fitz.       Reason for Consultation:  Nausea and vomiting   HPI: Jerry Arnold is a 32 y.o. male. Obese. Diagnosed with type 2 diabetes when he was in college.  In many years he has not seen a physician.  He stopped taking oral diabetes medications.  About a year ago, on his own initiative, he started buying 70/30 insulin and treats himself with anywhere from 40-50 units once daily.  He does not check his blood sugars.  Fortunately it does not sound like he is ever had any issues with profound hypoglycemia. 2 weeks ago, he had a toe injury, his nail fell off, this did not heal.  He presented to the [facility] emergency department and was admitted 04/11/17 with osteomyelitis.  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   For at least a year, probably a lot longer, patient has had episodes of postprandial nausea and vomiting 3 or 4 times a month.  The emesis consists of partially digested food and occurs within 5-15 minutes of mealtime.  He does not get heartburn.  He has not been taking any acid controlling medications.  He uses on average 1.6 g ibuprofen daily and also uses BC powders a few times a month for headaches.  Denies abdominal pain.  His normal bowel pattern is movements 2-3 times a week, these are brown, formed and he has never seen blood or melena.  Patient does not drink alcohol. Since the admission, the nausea and vomiting have been constant.  He hardly can tolerate clear liquids.  He is even throwing up when it is been several hours since he took anything by mouth. Meds for nausea include Reglan 10mg  IV q 6 hours initiated  yesterday.  Once daily IV Protonix just ordered and not yet given today.  PRN Zofran and Compazine.  He thinks the Compazine is more helpful and can alleviate his symptoms for a few hours.   Dilaudid PRN but hardly using, none since 2/24.   KUB 2/26: unremarkable.   Other than low albumin, LFTs, Lipase normal.  Increase Creatinine normal BUN but GFR 42, c/w Stage 3 CKD.  Initially sugars as high as 351, now in normal range but Hgb A1c of 14 (mean glucose 350).  Hgb 11.8 >> 10.3.  MCV mid 80s.      Family history notable for a younger sister, who is in her later 40s, who has type 2 diabetes.  A great aunt also has diabetes.  Is no family history of ulcer disease or intestinal cancers or issues.    Past Medical History:  Diagnosis Date  . Osteomyelitis (HCC) 04/11/2017   with "second toe on right foot" injury x 2-3 weeks (04/11/2017)  . Type II diabetes mellitus (HCC)     Past Surgical History:  Procedure Laterality Date  . AMPUTATION Right 04/13/2017   Procedure: SECOND  FOOT RAY AMPUTATION;  Surgeon: Nadara Mustard, MD;  Location: [facility] OR;  Service: Orthopedics;  Laterality: Right;  . NO PAST SURGERIES      Prior to Admission medications   Medication Sig Start Date End Date Taking? Authorizing Provider  ibuprofen (ADVIL,MOTRIN) 200 MG tablet Take 600 mg by mouth every 6 (six) hours as needed.   Yes [provider]  insulin aspart protamine- aspart (NOVOLOG MIX 70/30) (70-30) 100 UNIT/ML injection Inject 50 Units into the skin every other day.   Yes [provider]  clindamycin-benzoyl peroxide (BENZACLIN) gel Apply topically 2 (two) times daily. Patient not taking: Reported on 04/11/2017 12/19/16   Antony Madura, PA-C  doxycycline (VIBRAMYCIN) 100 MG capsule Take 1 capsule (100 mg total) by mouth 2 (two) times daily. Patient not taking: Reported on 04/11/2017 12/19/16   Antony Madura, PA-C  HYDROcodone-acetaminophen (NORCO) 5-325 MG tablet Take 1 tablet by mouth every 4 (four)  hours as needed (coughing - use only at night). Patient not taking: Reported on 04/11/2017 03/24/15   Dione Booze, MD  metFORMIN (GLUCOPHAGE) 500 MG tablet Take 1 tablet (500 mg total) by mouth 2 (two) times daily with a meal. Patient not taking: Reported on 04/11/2017 03/24/15   Dione Booze, MD  promethazine (PHENERGAN) 25 MG tablet Take 1 tablet (25 mg total) by mouth every 6 (six) hours as needed for nausea or vomiting. Patient not taking: Reported on 02/28/2014 09/26/13 03/24/15  Oswaldo Conroy, PA-C    Scheduled Meds: . amLODipine  5 mg Oral Daily  . feeding supplement (PRO-STAT SUGAR FREE 64)  30 mL Oral BID  . heparin injection (subcutaneous)  5,000 Units Subcutaneous Q8H  . insulin aspart  0-9 Units Subcutaneous TID WC  . insulin glargine  20 Units Subcutaneous QHS  . metoCLOPramide (REGLAN) injection  10 mg Intravenous Q6H  . ondansetron (ZOFRAN) IV  4 mg Intravenous Q8H  . pantoprazole (PROTONIX) IV  40 mg Intravenous Q24H  . polyethylene glycol  17 g Oral BID  . promethazine  12.5 mg Intravenous Once   Infusions: . sodium chloride 125 mL/hr at 04/18/17 0918   PRN Meds: acetaminophen **OR** acetaminophen, alum & mag hydroxide-simeth, bisacodyl, HYDROmorphone (DILAUDID) injection, prochlorperazine   Allergies as of 04/11/2017  . (No Known Allergies)    Family History  Problem Relation Age of Onset  . Hypertension Mother   . Hypertension Maternal Grandmother   . Hypertension Paternal Grandmother     Social History   Socioeconomic History  . Marital status: Single    Spouse name: Not on file  . Number of children: Not on file  . Years of education: Not on file  . Highest education level: Not on file  Social Needs  . Financial resource strain: Not on file  . Food insecurity - worry: Not on file  . Food insecurity - inability: Not on file  . Transportation needs - medical: Not on file  . Transportation needs - non-medical: Not on file  Occupational History  . Not on  file  Tobacco Use  . Smoking status: Never Smoker  . Smokeless tobacco: Never Used  Substance and Sexual Activity  . Alcohol use: Yes    Comment: 04/11/2017 "might have a few drinks/year"  . Drug use: No  . Sexual activity: Not on file  Other Topics Concern  . Not on file  Social History Narrative  . Not on file    REVIEW OF SYSTEMS: Constitutional: Generally feels tired and weak. ENT:  No nose bleeds Pulm: Denies cough or dyspnea. CV:  No palpitations, no LE edema.  Chest pain. GU:  + frequency GI:  Per HPI Heme: No unusual bleeding or bruising. Transfusions: None Neuro: Denies seizures or dizziness.  Fairly frequent headaches. Derm:  No itching, no rash or sores other than the sore on his right toe. Endocrine:  No sweats or chills.  Polyuria and polydipsia. Immunization: None recently Travel:  None beyond local counties in last few months.    PHYSICAL EXAM: Vital signs in last 24 hours: Vitals:   04/18/17 0634 04/18/17 0915  BP: (!) 160/90 (!) 182/108  Pulse: 88 89  Resp:  16  Temp:  98.7 F (37.1 C)  SpO2:  99%   Wt Readings from Last 3 Encounters:  04/18/17 236 lb 12.8 oz (107.4 kg)  12/19/16 235 lb (106.6 kg)  08/18/14 248 lb (112.5 kg)   General: Obese Head: No facial asymmetry or swelling.  No signs of head trauma. Eyes: No scleral icterus.  No conjunctival pallor.  EOMI. Ears: Not hard of hearing. Nose: No discharge or congestion. Mouth: A few missing teeth but dentition in good repair.  Tongue midline.  Oral mucosa pink, moist, clear. Neck: No JVD, no thyromegaly, no masses. Lungs: Clear bilaterally.  No cough or labored breathing. Heart: RRR.  No MRG.  S1, S2 present. Abdomen: Soft.  Not tender or distended.  Bowel sounds hypoactive but not tinkling or tympanitic.  No HSM, bruits, hernias..   Rectal: Deferred Musc/Skeltl: The right lower leg is Ace wrapped.  Right second toe surgically absent. Extremities: No CCE. Neurologic: Alert.  Oriented x3.   Moves all 4 limbs without gross weakness.  No tremors. Skin: No rashes, no acanthosis nigricans.   Tattoos: None observed. Nodes: No cervical adenopathy. Psych: Flat affect, seems depressed and disengaged but polite and cooperative.  Not agitated or anxious.  Intake/Output from previous day: 02/27 0701 - 02/28 0700 In: 3076.3 [P.O.:60; I.V.:2916.3; IV Piggyback:100] Out: 4000 [Urine:3700; Emesis/NG output:300] Intake/Output this shift: Total I/O In: -  Out: 575 [Urine:575]  LAB RESULTS: Recent Labs    04/16/17 1145 04/17/17 0454  WBC 11.8* 9.2  HGB 11.4* 10.3*  HCT 34.6* 33.3*  PLT 430* 394   BMET Lab Results  Component Value Date   NA 143 04/18/2017   NA 146 (H) 04/17/2017   NA 144 04/16/2017   K 3.1 (L) 04/18/2017   K 3.5 04/17/2017   K 3.4 (L) 04/16/2017   CL 108 04/18/2017   CL 112 (H) 04/17/2017   CL 107 04/16/2017   CO2 24 04/18/2017   CO2 23 04/17/2017   CO2 26 04/16/2017   GLUCOSE 94 04/18/2017   GLUCOSE 78 04/17/2017   GLUCOSE 177 (H) 04/16/2017   BUN 8 04/18/2017   BUN 8 04/17/2017   BUN 8 04/16/2017   CREATININE 2.28 (H) 04/18/2017   CREATININE 2.35 (H) 04/17/2017   CREATININE 2.20 (H) 04/16/2017   CALCIUM 8.3 (L) 04/18/2017   CALCIUM 8.4 (L) 04/17/2017   CALCIUM 8.4 (L) 04/16/2017   LFT Recent Labs    04/16/17 1145 04/17/17 0454  PROT 7.8 7.4  ALBUMIN 2.3* 2.3*  AST 13* 14*  ALT 9* 9*  ALKPHOS 80 75  BILITOT 0.4 0.5  BILIDIR <0.1*  --   IBILI NOT CALCULATED  --    PT/INR Lab Results  Component Value Date   INR 1.10 04/11/2017   Hepatitis Panel No results for input(s): HEPBSAG, HCVAB, HEPAIGM, HEPBIGM in the  last 72 hours. C-Diff No components found for: CDIFF Lipase     Component Value Date/Time   LIPASE 21 04/16/2017 1145    Drugs of Abuse  No results found for: LABOPIA, COCAINSCRNUR, LABBENZ, AMPHETMU, THCU, LABBARB   RADIOLOGY STUDIES: [place] Renal  Result Date: 04/17/2017 CLINICAL DATA:  Acute renal injury.   Osteomyelitis, diabetes. EXAM: RENAL / URINARY TRACT ULTRASOUND COMPLETE COMPARISON:  None in PACs FINDINGS: Right Kidney: Length: 14.1 cm. The renal cortical echotexture exceeds that of the adjacent liver. There is no hydronephrosis or cystic or solid mass. Left Kidney: Length: 14.9 cm. The renal cortical echotexture is increased similar to that on the right. There is no hydronephrosis or cystic or solid mass. Bladder: Appears normal for degree of bladder distention. IMPRESSION: Increased renal cortical echotexture compatible with medical renal disease. No hydronephrosis. Electronically Signed   By: David  SwazilandJordan M.D.   On: 04/17/2017 09:34   Dg Abd Portable 1v  Result Date: 04/16/2017 CLINICAL DATA:  Vomiting for the past 3 days. EXAM: PORTABLE ABDOMEN - 1 VIEW COMPARISON:  None in PACs FINDINGS: A single supine view of the abdomen and pelvis is reviewed. The hemidiaphragms are excluded from the study. The bowel gas pattern is within the limits of normal. There is no evidence of ileus or obstruction. No free extraluminal gas collections are observed. There are no abnormal soft tissue calcifications. The bony structures are unremarkable. IMPRESSION: No acute intra-abdominal abnormality is observed. Electronically Signed   By: David  [place]il[place]Jordan M.D.   On: 04/16/2017 15:53    IMPRESSION:   *    Postprandial nausea vomiting in a poorly controlled diabetic.  Suspect acute on chronic diabetic gastroparesis.  Takes a significant amount of ibuprofen, 1.6 g daily and additional/occasional BC powders so cannot rule out ulcers.  Has never had coffee ground or bloody emesis.  *    Normocytic anemia  *     Right second toe osteomyelitis, status post ray amputation.  *     Diabetes mellitus type 2.  Poorly controlled as reflected by hemoglobin A1c of >14.  His management strategy at home is scary given that he is taking 40-50 units of 70/30 insulin without checking his blood sugars.    PLAN:     *  Check FOBT  and anemia profile.  Add scheduled IV phenergan.  Up Protonix to BID.  leave Reglan and prn zofran, prn compazine in place.   EGD? arrange for tomorrow with Dr Lavon PaganiniNandigam?: Dr Rhea BeltonPyrtle to decide.     Jennye MoccasinSarah Iyonnah Ferrante  04/18/2017, 11:17 AM Pager: [phone]

## 2017-04-18 NOTE — Progress Notes (Signed)
Pt BP elevated at 175/122, HR 91, Temp 99, pt asymptomatic at this time, resting. Blount, NP notified.

## 2017-04-19 ENCOUNTER — Encounter (HOSPITAL_COMMUNITY): Payer: Self-pay | Admitting: *Deleted

## 2017-04-19 ENCOUNTER — Encounter (HOSPITAL_COMMUNITY)
Admission: EM | Disposition: A | Discharge status: Home / Self Care | Payer: Self-pay | Source: Home / Self Care | Attending: Family Medicine

## 2017-04-19 LAB — RETICULOCYTES
RBC.: 4.01 MIL/uL — ABNORMAL LOW (ref 4.22–5.81)
Retic Count, Absolute: 52.1 10*3/uL (ref 19.0–186.0)
Retic Ct Pct: 1.3 % (ref 0.4–3.1)

## 2017-04-19 LAB — BASIC METABOLIC PANEL
Anion gap: 13 (ref 5–15)
BUN: 7 mg/dL (ref 6–20)
CO2: 25 mmol/L (ref 22–32)
Calcium: 8.8 mg/dL — ABNORMAL LOW (ref 8.9–10.3)
Chloride: 108 mmol/L (ref 101–111)
Creatinine, Ser: 2.24 mg/dL — ABNORMAL HIGH (ref 0.61–1.24)
GFR calc Af Amer: 43 mL/min — ABNORMAL LOW (ref >60)
GFR calc non Af Amer: 37 mL/min — ABNORMAL LOW (ref >60)
Glucose, Bld: 113 mg/dL — ABNORMAL HIGH (ref 65–99)
Potassium: 3.5 mmol/L (ref 3.5–5.1)
Sodium: 146 mmol/L — ABNORMAL HIGH (ref 135–145)

## 2017-04-19 LAB — FERRITIN: Ferritin: 264 ng/mL (ref 24–336)

## 2017-04-19 LAB — IRON AND TIBC
Iron: 27 ug/dL — ABNORMAL LOW (ref 45–182)
Saturation Ratios: 14 % — ABNORMAL LOW (ref 17.9–39.5)
TIBC: 188 ug/dL — ABNORMAL LOW (ref 250–450)
UIBC: 161 ug/dL

## 2017-04-19 LAB — FOLATE: Folate: 14.2 ng/mL (ref >5.9)

## 2017-04-19 LAB — VITAMIN B12: Vitamin B-12: 205 pg/mL (ref 180–914)

## 2017-04-19 LAB — GLUCOSE, CAPILLARY
Glucose-Capillary: 100 mg/dL — ABNORMAL HIGH (ref 65–99)
Glucose-Capillary: 145 mg/dL — ABNORMAL HIGH (ref 65–99)
Glucose-Capillary: 157 mg/dL — ABNORMAL HIGH (ref 65–99)

## 2017-04-19 SURGERY — CANCELLED PROCEDURE
Anesthesia: Monitor Anesthesia Care

## 2017-04-19 MED ORDER — HYDRALAZINE HCL 20 MG/ML IJ SOLN
10.0000 mg | Freq: Four times a day (QID) | INTRAMUSCULAR | Status: DC | PRN
  Administered 2017-04-19 (×2): 10 mg via INTRAVENOUS
  Filled 2017-04-19: qty 1

## 2017-04-19 MED ORDER — AMLODIPINE BESYLATE 10 MG PO TABS
10.0000 mg | ORAL_TABLET | Freq: Every day | ORAL | Status: DC
  Administered 2017-04-19 – 2017-04-20 (×2): 10 mg via ORAL
  Filled 2017-04-19 (×2): qty 1

## 2017-04-19 MED ORDER — LACTATED RINGERS IV SOLN
INTRAVENOUS | Status: AC | PRN
  Administered 2017-04-19: 1000 mL via INTRAVENOUS

## 2017-04-19 MED ORDER — ENSURE ENLIVE PO LIQD
237.0000 mL | ORAL | Status: DC
  Administered 2017-04-19: 237 mL via ORAL

## 2017-04-19 MED ORDER — HYDRALAZINE HCL 20 MG/ML IJ SOLN
INTRAMUSCULAR | Status: AC
  Filled 2017-04-19: qty 1

## 2017-04-19 MED ORDER — PREMIER PROTEIN SHAKE
2.0000 [oz_av] | ORAL | Status: DC
  Administered 2017-04-20: 2 [oz_av] via ORAL
  Filled 2017-04-19 (×2): qty 325.31

## 2017-04-19 MED ORDER — LABETALOL HCL 5 MG/ML IV SOLN
10.0000 mg | INTRAVENOUS | Status: AC
  Administered 2017-04-19: 10 mg via INTRAVENOUS
  Filled 2017-04-19: qty 4

## 2017-04-19 MED ORDER — POLYETHYLENE GLYCOL 3350 17 G PO PACK
17.0000 g | PACK | Freq: Every day | ORAL | Status: DC
  Filled 2017-04-19: qty 1

## 2017-04-19 MED ORDER — PANTOPRAZOLE SODIUM 40 MG PO TBEC
40.0000 mg | DELAYED_RELEASE_TABLET | Freq: Every day | ORAL | Status: DC
  Administered 2017-04-20: 40 mg via ORAL
  Filled 2017-04-19: qty 1

## 2017-04-19 MED ORDER — METOCLOPRAMIDE HCL 10 MG PO TABS
5.0000 mg | ORAL_TABLET | Freq: Three times a day (TID) | ORAL | Status: DC
  Administered 2017-04-19 – 2017-04-20 (×4): 5 mg via ORAL
  Filled 2017-04-19 (×4): qty 1

## 2017-04-19 MED ORDER — INSULIN ASPART PROT & ASPART (70-30 MIX) 100 UNIT/ML ~~LOC~~ SUSP
15.0000 [IU] | Freq: Two times a day (BID) | SUBCUTANEOUS | Status: DC
  Administered 2017-04-19 – 2017-04-20 (×2): 15 [IU] via SUBCUTANEOUS
  Filled 2017-04-19: qty 10

## 2017-04-19 MED ORDER — POTASSIUM CHLORIDE IN NACL 20-0.9 MEQ/L-% IV SOLN
INTRAVENOUS | Status: DC
  Administered 2017-04-19 (×2): via INTRAVENOUS
  Filled 2017-04-19 (×3): qty 1000

## 2017-04-19 MED ORDER — ADULT MULTIVITAMIN W/MINERALS CH
1.0000 | ORAL_TABLET | Freq: Every day | ORAL | Status: DC
  Administered 2017-04-19 – 2017-04-20 (×2): 1 via ORAL
  Filled 2017-04-19 (×2): qty 1

## 2017-04-19 MED ORDER — PROMETHAZINE HCL 25 MG/ML IJ SOLN
6.2500 mg | Freq: Four times a day (QID) | INTRAMUSCULAR | Status: AC
  Administered 2017-04-19: 6.25 mg via INTRAVENOUS
  Filled 2017-04-19: qty 1

## 2017-04-19 NOTE — Care Management Note (Incomplete)
Case Management Note  Patient Details  Name: Jerry Arnold MRN: [id number] Date of Birth: [birth year]  Subjective/Objective:                    Action/Plan: Transition to home when medically stable... CM to f/u with disposition needs.  N[facility]  [facility] @ 2:30 pm.    Expected Discharge Date:                  Expected Discharge Plan:     In-House Referral:     Discharge planning Services     Post Acute Care Choice:    Choice offered to:     DME Arranged:    DME Agency:     HH Arranged:    HH Agency:     Status of Service:     If discussed at [employer] Meetings, dates discussed:    Additional Comments:  Epifanio LeschesCole, [name] Hudson, RN 04/19/2017, 4:45 PM

## 2017-04-19 NOTE — Plan of Care (Signed)
Progressing

## 2017-04-19 NOTE — Progress Notes (Signed)
Initial Nutrition Assessment  DOCUMENTATION CODES:   Obesity unspecified  INTERVENTION:  - Will order Ensure Enlive once/day, this supplement provides 350 kcal and 20 grams of protein. - Will order Premier Protein once/day, this supplement provides 160 kcal and 30 grams of protein.  - Will order daily multivitamin with minerals.  - Continue to encourage PO intakes. - Continued diet advancement as medically feasible.   NUTRITION DIAGNOSIS:   Inadequate oral intake related to acute illness, nausea, vomiting, other (see comment)(current diet order) as evidenced by meal completion < 50%, other (comment)(CLD ordered).  GOAL:   Patient will meet greater than or equal to 90% of their needs  MONITOR:   PO intake, Supplement acceptance, Diet advancement, Weight trends, Labs  REASON FOR ASSESSMENT:   Malnutrition Screening Tool  ASSESSMENT:   32 y.o. male with history of insulin-dependent diabetes-presented with acute ostium myelitis and gangrenous changes of the right second toe, patient was evaluated by orthopedics and underwent a right second ray amputation. Postoperative hospital course was complicated by intractable nausea with vomiting, and acute kidney injury.  BMI indicates obesity. Pt has been NPO or on CLD since admission on 2/21. Last documented intakes were 25% of lunch and dinner yesterday. EGD was planned for today but canceled d/t high blood pressure. Diet advanced from CLD to FLD today at 10:18 AM. Per GI PA note, plan for further advancement to Carb Modified for dinner if pt tolerates FLD for lunch.   Pt sleeping soundly with no family/visitors present. He did not awake to name call or during assessment of upper body. Will need to obtain information at follow-up and assess legs/lower body at that time. Per chart review, pt presented to the ED with N/V. Per GI PA note, pt reported to her that N/V has improved and that he was able to consume CLD without issue yesterday (no N/V  with intakes).   Pt underwent second R toe amputation on 2/23 d/t osteomyelitis. Notes state that pt now not consenting to EGD and does not want it done. Prostat ordered BID at time of admission; pt was mainly accepting it during the times he was on CLD. Will d/c at this time and order supplements that are more nutrient dense as N/V improving.   Per chart review, weight initially trended up after admission but began trending back down yesterday. Suspect initial increase was d/t fluid for/after surgery. Current weight is -8 lbs (5.8% body weight) since admission (8 days). This is significant for time frame. Review of medications since admission and did not see any diuretics ordered. With weight loss and diet order since admission, pt likely meets criteria for malnutrition, but RD does not feel comfortable identifying malnutrition without being able to first talk with pt.   Medications reviewed; sliding scale Novolog, 5 mg oral Reglan TID, 1 packet Miralax/day.  Labs reviewed; CBG: 100 mg/dL this AM, Na: 161 mmol/L, creatinine: 2.24 mg/dL, Ca: 8.8 mg/dL, GFR: 43 mL/min.   IVF: NS-20 mEq IV KCl @ 40 mL/hr.       NUTRITION - FOCUSED PHYSICAL EXAM:  Upper body only assessed; no muscle or fat wasting to upper body.   Diet Order:  Diet full liquid Room service appropriate? Yes; Fluid consistency: Thin  EDUCATION NEEDS:   No education needs have been identified at this time  Skin:  Skin Assessment: Skin Integrity Issues: Skin Integrity Issues:: Incisions Incisions: R foot incision from 2/23  Last BM:  3/1  Height:   Ht Readings from Last  1 Encounters:  04/19/17 6\' 1"  (1.854 m)    Weight:   Wt Readings from Last 1 Encounters:  04/19/17 230 lb (104.3 kg)    Ideal Body Weight:  83.64 kg  BMI:  Body mass index is 30.34 kg/m.  Estimated Nutritional Needs:   Kcal:  2085-2295  Protein:  115-130 grams  Fluid:  >/= 2.2 L/day      Trenton GammonJessica Jamisen Hawes, MS, RD, LDN, [facility] Inpatient  Clinical Dietitian Pager # [phone] After hours/weekend pager # [phone]

## 2017-04-19 NOTE — Progress Notes (Signed)
Patient's EGD cancelled this am due to hypertension. Patient running 200's/120's. Patient treated with 10mg  hydralazine per PRN order with no change in BP (see flowsheets). Report called to RN and patient returned to floor but kept NPO until verification from procedural MD.

## 2017-04-19 NOTE — Progress Notes (Signed)
Pt transported to Endo 

## 2017-04-19 NOTE — Progress Notes (Addendum)
Daily Rounding Note  04/19/2017, 9:43 AM  LOS: 8 days   SUBJECTIVE:   Chief complaint:  N/V is better.  Able to keep down clears last night.  BM x 1 yesterday.     EGD cancelled for this AM due to BPs 200s/120s.    OBJECTIVE:         Vital signs in last 24 hours:    Temp:  [98.2 F (36.8 C)-99.2 F (37.3 C)] 98.2 F (36.8 C) (03/01 0829) Pulse Rate:  [87-104] 96 (03/01 0900) Resp:  [16-20] 20 (03/01 0829) BP: (165-202)/(99-129) 177/114 (03/01 0900) SpO2:  [97 %-99 %] 98 % (03/01 0900) Weight:  [230 lb (104.3 kg)-230 lb 9.6 oz (104.6 kg)] 230 lb (104.3 kg) (03/01 0731) Last BM Date: 04/19/17 Filed Weights   04/18/17 0500 04/19/17 0500 04/19/17 0731  Weight: 236 lb 12.8 oz (107.4 kg) 230 lb 9.6 oz (104.6 kg) 230 lb (104.3 kg)   General: not ill looking.  Sleepy but easy to wake up.     Heart: RRR Chest: clear bil.  No dyspnea or cough Abdomen: soft, somewhat diminished but normal quality BS.  NT  Extremities: no CCE Neuro/Psych:  Oriented x 3.  Flat affect, seems depressed (he denies feeling depressed). No gross neuro deficits, no tremors or gross signs of dystonia.    Intake/Output from previous day: 02/28 0701 - 03/01 0700 In: 2270.4 [P.O.:960; I.V.:1310.4] Out: 5755 [Urine:5755]  Intake/Output this shift: No intake/output data recorded.  Lab Results: Recent Labs    04/16/17 1145 04/17/17 0454  WBC 11.8* 9.2  HGB 11.4* 10.3*  HCT 34.6* 33.3*  PLT 430* 394   BMET Recent Labs    04/17/17 0454 04/18/17 0425 04/19/17 0539  NA 146* 143 146*  K 3.5 3.1* 3.5  CL 112* 108 108  CO2 23 24 25   GLUCOSE 78 94 113*  BUN 8 8 7   CREATININE 2.35* 2.28* 2.24*  CALCIUM 8.4* 8.3* 8.8*   LFT Recent Labs    04/16/17 1145 04/17/17 0454  PROT 7.8 7.4  ALBUMIN 2.3* 2.3*  AST 13* 14*  ALT 9* 9*  ALKPHOS 80 75  BILITOT 0.4 0.5  BILIDIR <0.1*  --   IBILI NOT CALCULATED  --    PT/INR No results for  input(s): LABPROT, INR in the last 72 hours. Hepatitis Panel No results for input(s): HEPBSAG, HCVAB, HEPAIGM, HEPBIGM in the last 72 hours.  Studies/Results: Dg Chest 2 View  Result Date: 04/18/2017 CLINICAL DATA:  Cough and vomiting. EXAM: CHEST  2 VIEW COMPARISON:  03/23/2015. FINDINGS: Mediastinum and hilar structures normal. Mild right mid lung and left base subsegmental atelectasis/infiltrate. No pleural effusion or pneumothorax. Heart size normal. No acute bony abnormality. IMPRESSION: Mild right mid lung and left base subsegmental atelectasis/infiltrate. Electronically Signed   By: Maisie Fushomas  Register   On: 04/18/2017 12:24   Scheduled Meds: . amLODipine  10 mg Oral Daily  . feeding supplement (PRO-STAT SUGAR FREE 64)  30 mL Oral BID  . heparin injection (subcutaneous)  5,000 Units Subcutaneous Q8H  . insulin aspart  0-9 Units Subcutaneous TID WC  . insulin glargine  20 Units Subcutaneous QHS  . metoCLOPramide (REGLAN) injection  10 mg Intravenous Q6H  . ondansetron (ZOFRAN) IV  4 mg Intravenous Q8H  . pantoprazole (PROTONIX) IV  40 mg Intravenous Q12H  . polyethylene glycol  17 g Oral BID  . promethazine  12.5 mg Intravenous Once  . promethazine  12.5 mg Intravenous Q6H   Continuous Infusions: . 0.9 % NaCl with KCl 20 mEq / L 40 mL/hr at 04/19/17 0851   PRN Meds:.acetaminophen **OR** acetaminophen, alum & mag hydroxide-simeth, bisacodyl, hydrALAZINE, HYDROmorphone (DILAUDID) injection, prochlorperazine   ASSESMENT:   *  Acute on chronic N/V.  Primarily post prandial.  In uncontrolled diabetic.  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   Scheduled IV Phenergan added 2/28 to newly Rxd scheduled IV Reglan.  Protonix IV upped to BID 2/28  *  Uncontrolled DM.  Glucoses now well controlled with scheduled insulin.    *  Normocytic anemia.  Stable.    *   Hypertension.  This forced cancellation of EGD this AM.    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  Off abx.     PLAN   *  Full  liquids, if tolerated at lunch can advance to carb mod diet at dinner.    *  Switch to once daily PPI.  Switch Reglan to po AC for next 48 hours, then discontinue.  Reglan can be added back if needed but with it's potential for dystonia s/e, prefer to try to wean.   Drop Phenergan IV to 6.25 mg q 6 and stop after today.   Though not having excessive BMs, BID Miralax seems excessive so will drop to once daily.    *  Note that whatever meds he will need at discharge, cost is going to be an issue.  *  No EGD as pt not consenting, does not want it done.    *  No planned GI follow up .  Needs to establish with a PMD for diabetic mgt.        Jennye Moccasin  04/19/2017, 9:43 AM Pager: [phone]   Attending physician's note   I have taken an interval history, reviewed the chart and examined the patient. I agree with the Advanced Practitioner's note, impression and recommendations.  32 year old male with poorly controlled diabetes with intractable nausea and vomiting.  He is feeling better with improvement of nausea and no vomiting since yesterday.  Patient declined EGD later in the day after he was cancelled by anesthesia for uncontrolled blood pressure with diastolic greater than 100 and systolic greater than 200. Hemoglobin has remained stable, chronic normocytic anemia. No hemodynamic instability. Advance diet as tolerated Continue PPI daily Avoid NSAID's Will sign off, available for any questions or concerns  K Scherry Ran, MD [phone] Mon-Fri 8a-5p [phone] after 5p, weekends, holidays

## 2017-04-19 NOTE — Progress Notes (Signed)
PROGRESS NOTE        PATIENT DETAILS Name: Jerry Arnold Age: 32 y.o. Sex: male Date of Birth: 1985/04/11 Admit Date: 04/11/2017 Admitting Physician Pearson Grippe, MD JXB:[id number], No Pcp Per  Brief Narrative: Patient is a 32 y.o. male with history of insulin-dependent diabetes-presented with acute ostium myelitis and gangrenous changes of the right second toe, patient was evaluated by orthopedics and underwent a right second ray amputation. Postoperative hospital course was complicated by intractable nausea with vomiting, and acute kidney injury.  See below for further details  Subjective: No vomiting since yesterday.  EGD canceled this morning due to high blood pressure-but now patient does not want to go through with a EGD as he has improved.  Had BM yesterday.  Assessment/Plan: Right second toe osteomyelitis with gangrene: Initially on broad-spectrum antimicrobial therapy-evaluated by orthopedics and underwent secondary amputation on 2/23.  Wound cultures positive for group B strep and MSSA-since orthopedics (see note on 2/25) recommending only a few days of antimicrobial therapy postoperatively-all antimicrobial therapy has been discontinued as of 2/27.  Intractable nausea with vomiting likely secondary to gastroparesis: Seems to have improved with scheduled IV Reglan and IV Zofran-high suspicion for gastroparesis (although not formally diagnosed) given long-standing history of diabetes.  Seems to have tolerated clear liquids-we will advance to full liquids today.  EGD canceled due to high blood pressure-but patient does not want to proceed as well.  GI following-and has changed from IV to oral Reglan.  Hopefully we can advance diet further if clinical improvement continues.   Acute kidney injury: Likely hemodynamically mediated in the setting of vomiting-renal ultrasound negative for hydronephrosis, no significant proteinuria seen on urinalysis.  Renal function has  plateaued and is actually slowly improving.  Continue to avoid nephrotoxic agents-diet being advanced-volume status is stable-we will decrease IV fluids to 40 cc an hour.   Hypertension: Uncontrolled-have increased amlodipine to 10 mg-hopefully with decreasing IV fluids-in better control of his vomiting-his blood pressure will slowly start to improve.  If it continues to be uncontrolled-we will add additional agents.  Insulin-dependent diabetes: CBGs relatively stable with Lantus and SSI-since vomiting has greatly improved-we will go ahead and switch him back to insulin 70/30-this is apparently what patient takes at home.  Start insulin 70/3015 units twice daily and follow.    DVT Prophylaxis: Prophylactic Heparin   Code Status: Full code   Family Communication: Aunt at bedside  Disposition Plan: Remain inpatient-requires several more days of hospital  Antimicrobial agents: Anti-infectives (From admission, onward)   Start     Dose/Rate Route Frequency Ordered Stop   04/15/17 0900  cefTRIAXone (ROCEPHIN) 2 g in sodium chloride 0.9 % 100 mL IVPB  Status:  Discontinued     2 g 200 mL/hr over 30 Minutes Intravenous Every 24 hours 04/15/17 0827 04/17/17 1423   04/13/17 1345  ceFAZolin (ANCEF) IVPB 2g/100 mL premix     2 g 200 mL/hr over 30 Minutes Intravenous To Surgery 04/13/17 1334 04/14/17 1345   04/12/17 0100  vancomycin (VANCOCIN) IVPB 1000 mg/200 mL premix  Status:  Discontinued     1,000 mg 200 mL/hr over 60 Minutes Intravenous Every 8 hours 04/11/17 2218 04/15/17 0827   04/11/17 2300  piperacillin-tazobactam (ZOSYN) IVPB 3.375 g  Status:  Discontinued     3.375 g 12.5 mL/hr over 240 Minutes Intravenous Every 8 hours 04/11/17 2218  04/15/17 0827   04/11/17 1800  vancomycin (VANCOCIN) 2,000 mg in sodium chloride 0.9 % 500 mL IVPB     2,000 mg 250 mL/hr over 120 Minutes Intravenous  Once 04/11/17 1715 04/11/17 2250   04/11/17 1730  piperacillin-tazobactam (ZOSYN) IVPB 3.375 g      3.375 g 100 mL/hr over 30 Minutes Intravenous  Once 04/11/17 1715 04/11/17 1849      Procedures: 2/23 SECOND FOOT RAY AMPUTATION  CONSULTS:  orthopedic surgery  Time spent: 25- minutes-Greater than 50% of this time was spent in counseling, explanation of diagnosis, planning of further management, and coordination of care.  MEDICATIONS: Scheduled Meds: . amLODipine  10 mg Oral Daily  . feeding supplement (PRO-STAT SUGAR FREE 64)  30 mL Oral BID  . heparin injection (subcutaneous)  5,000 Units Subcutaneous Q8H  . insulin aspart  0-9 Units Subcutaneous TID WC  . insulin glargine  20 Units Subcutaneous QHS  . metoCLOPramide  5 mg Oral TID AC  . ondansetron (ZOFRAN) IV  4 mg Intravenous Q8H  . [START ON 04/20/2017] pantoprazole  40 mg Oral Q0600  . [START ON 04/20/2017] polyethylene glycol  17 g Oral Daily  . promethazine  6.25 mg Intravenous Q6H   Continuous Infusions: . 0.9 % NaCl with KCl 20 mEq / L 40 mL/hr at 04/19/17 0851   PRN Meds:.acetaminophen **OR** acetaminophen, alum & mag hydroxide-simeth, bisacodyl, hydrALAZINE, HYDROmorphone (DILAUDID) injection, prochlorperazine   PHYSICAL EXAM: Vital signs: Vitals:   04/19/17 0829 04/19/17 0846 04/19/17 0854 04/19/17 0900  BP: (!) 187/119 (!) 183/115 (!) 175/109 (!) 177/114  Pulse: 94 92 96 96  Resp: 20     Temp: 98.2 F (36.8 C)     TempSrc: Oral     SpO2:  99% 98% 98%  Weight:      Height:       Filed Weights   04/18/17 0500 04/19/17 0500 04/19/17 0731  Weight: 107.4 kg (236 lb 12.8 oz) 104.6 kg (230 lb 9.6 oz) 104.3 kg (230 lb)   Body mass index is 30.34 kg/m.   General appearance :Awake, alert, not in any distress.  Eyes:, pupils equally reactive to light and accomodation,no scleral icterus. HEENT: Atraumatic and Normocephalic Neck: supple, no JVD. Resp:Good air entry bilaterally, no rales or rhonchi CVS: S1 S2 regular, no murmurs.  GI: Bowel sounds present, Non tender and not distended with no gaurding,  rigidity or rebound. Extremities: B/L Lower Ext shows no edema, both legs are warm to touch Neurology:  speech clear,Non focal, sensation is grossly intact. Psychiatric: Normal judgment and insight. Normal mood. Musculoskeletal:No digital cyanosis Skin:No Rash, warm and dry Wounds:N/A I have personally reviewed following labs and imaging studies  LABORATORY DATA: CBC: Recent Labs  Lab 04/15/17 0510 04/16/17 1145 04/17/17 0454  WBC 10.1 11.8* 9.2  HGB 10.8* 11.4* 10.3*  HCT 33.0* 34.6* 33.3*  MCV 85.3 85.9 86.3  PLT 377 430* 394    Basic Metabolic Panel: Recent Labs  Lab 04/15/17 0510 04/16/17 0617 04/17/17 0454 04/18/17 0425 04/19/17 0539  NA 137 144 146* 143 146*  K 3.7 3.4* 3.5 3.1* 3.5  CL 98* 107 112* 108 108  CO2 26 26 23 24 25   GLUCOSE 241* 177* 78 94 113*  BUN 8 8 8 8 7   CREATININE 2.12* 2.20* 2.35* 2.28* 2.24*  CALCIUM 8.8* 8.4* 8.4* 8.3* 8.8*    GFR: Estimated Creatinine Clearance: 60.1 mL/min (A) (by C-G formula based on SCr of 2.24 mg/dL (H)).  Liver Function  Tests: Recent Labs  Lab 04/16/17 1145 04/17/17 0454  AST 13* 14*  ALT 9* 9*  ALKPHOS 80 75  BILITOT 0.4 0.5  PROT 7.8 7.4  ALBUMIN 2.3* 2.3*   Recent Labs  Lab 04/16/17 1145  LIPASE 21   No results for input(s): AMMONIA in the last 168 hours.  Coagulation Profile: No results for input(s): INR, PROTIME in the last 168 hours.  Cardiac Enzymes: No results for input(s): CKTOTAL, CKMB, CKMBINDEX, TROPONINI in the last 168 hours.  BNP (last 3 results) No results for input(s): PROBNP in the last 8760 hours.  HbA1C: No results for input(s): HGBA1C in the last 72 hours.  CBG: Recent Labs  Lab 04/18/17 0836 04/18/17 1241 04/18/17 1726 04/18/17 2152 04/19/17 0825  GLUCAP 81 123* 145* 133* 100*    Lipid Profile: No results for input(s): CHOL, HDL, LDLCALC, TRIG, CHOLHDL, LDLDIRECT in the last 72 hours.  Thyroid Function Tests: No results for input(s): TSH, T4TOTAL, FREET4,  T3FREE, THYROIDAB in the last 72 hours.  Anemia Panel: Recent Labs    04/19/17 0539  VITAMINB12 205  FOLATE 14.2  FERRITIN 264  TIBC 188*  IRON 27*  RETICCTPCT 1.3    Urine analysis:    Component Value Date/Time   COLORURINE COLORLESS (A) 04/17/2017 0900   APPEARANCEUR CLEAR 04/17/2017 0900   LABSPEC 1.004 (L) 04/17/2017 0900   PHURINE 6.0 04/17/2017 0900   GLUCOSEU NEGATIVE 04/17/2017 0900   HGBUR SMALL (A) 04/17/2017 0900   BILIRUBINUR NEGATIVE 04/17/2017 0900   KETONESUR NEGATIVE 04/17/2017 0900   PROTEINUR NEGATIVE 04/17/2017 0900   NITRITE NEGATIVE 04/17/2017 0900   LEUKOCYTESUR NEGATIVE 04/17/2017 0900    Sepsis Labs: Lactic Acid, Venous    Component Value Date/Time   LATICACIDVEN 1.12 04/11/2017 1713    MICROBIOLOGY: Recent Results (from the past 240 hour(s))  Culture, blood (Routine x 2)     Status: None   Collection Time: 04/11/17  5:00 PM  Result Value Ref Range Status   Specimen Description BLOOD LEFT ANTECUBITAL  Final   Special Requests   Final    BOTTLES DRAWN AEROBIC AND ANAEROBIC Blood Culture adequate volume   Culture   Final    NO GROWTH 5 DAYS Performed at [facility], [address] [place], [place] [address]    Report Status 04/16/2017 FINAL  Final  Culture, blood (Routine x 2)     Status: None   Collection Time: 04/11/17  5:20 PM  Result Value Ref Range Status   Specimen Description BLOOD RIGHT ANTECUBITAL  Final   Special Requests   Final    BOTTLES DRAWN AEROBIC AND ANAEROBIC Blood Culture results may not be optimal due to an excessive volume of blood received in culture bottles   Culture   Final    NO GROWTH 5 DAYS Performed at [facility], [address] [place], [place] [address]    Report Status 04/16/2017 FINAL  Final  Surgical pcr screen     Status: Abnormal   Collection Time: 04/12/17  3:47 AM  Result Value Ref Range Status   MRSA, PCR NEGATIVE NEGATIVE Final   Staphylococcus aureus POSITIVE (A) NEGATIVE  Final    Comment: (NOTE) The Xpert SA Assay (FDA approved for NASAL specimens in patients 51 years of age and older), is one component of a comprehensive surveillance program. It is not intended to diagnose infection nor to guide or monitor treatment. Performed at [facility], [address] [place],  [place] [id number]   Aerobic/Anaerobic Culture (surgical/deep wound)     Status: None   Collection Time: 04/13/17  8:10 AM  Result Value Ref Range Status   Specimen Description ULCER RIGHT FOOT  Final   Special Requests NONE  Final   Gram Stain   Final    FEW WBC PRESENT, PREDOMINANTLY PMN RARE GRAM POSITIVE COCCI IN PAIRS RARE GRAM POSITIVE RODS    Culture   Final    FEW GROUP B STREP(S.AGALACTIAE)ISOLATED TESTING AGAINST S. AGALACTIAE NOT ROUTINELY PERFORMED DUE TO PREDICTABILITY OF AMP/PEN/VAN SUSCEPTIBILITY. RARE STAPHYLOCOCCUS AUREUS NO ANAEROBES ISOLATED Performed at [facility], [address] [place], [place] [address]    Report Status 04/18/2017 FINAL  Final   Organism ID, Bacteria STAPHYLOCOCCUS AUREUS  Final      Susceptibility   Staphylococcus aureus - MIC*    CIPROFLOXACIN <=0.5 SENSITIVE Sensitive     ERYTHROMYCIN >=8 RESISTANT Resistant     GENTAMICIN <=0.5 SENSITIVE Sensitive     OXACILLIN 0.5 SENSITIVE Sensitive     TETRACYCLINE <=1 SENSITIVE Sensitive     VANCOMYCIN 1 SENSITIVE Sensitive     TRIMETH/SULFA <=10 SENSITIVE Sensitive     CLINDAMYCIN >=8 RESISTANT Resistant     RIFAMPIN <=0.5 SENSITIVE Sensitive     Inducible Clindamycin NEGATIVE Sensitive     * RARE STAPHYLOCOCCUS AUREUS  Culture, blood (Routine X 2) w Reflex to ID Panel     Status: None   Collection Time: 04/13/17  2:29 PM  Result Value Ref Range Status   Specimen Description BLOOD RIGHT ANTECUBITAL  Final   Special Requests   Final    BOTTLES DRAWN AEROBIC AND ANAEROBIC Blood Culture adequate volume   Culture   Final    NO GROWTH 5 DAYS Performed at [facility],  [address] [place], [place] [address]    Report Status 04/18/2017 FINAL  Final  Culture, blood (Routine X 2) w Reflex to ID Panel     Status: None   Collection Time: 04/13/17  2:30 PM  Result Value Ref Range Status   Specimen Description BLOOD LEFT HAND  Final   Special Requests   Final    BOTTLES DRAWN AEROBIC AND ANAEROBIC Blood Culture adequate volume   Culture   Final    NO GROWTH 5 DAYS Performed at [facility]  Lab, [address] [place], [place] [address]    Report Status 04/18/2017 FINAL  Final    RADIOLOGY STUDIES/RESULTS: Dg Chest 2 View  Result Date: 04/18/2017 CLINICAL DATA:  Cough and vomiting. EXAM: CHEST  2 VIEW COMPARISON:  03/23/2015. FINDINGS: Mediastinum and hilar structures normal. Mild right mid lung and left base subsegmental atelectasis/infiltrate. No pleural effusion or pneumothorax. Heart size normal. No acute bony abnormality. IMPRESSION: Mild right mid lung and left base subsegmental atelectasis/infiltrate. Electronically Signed   By: Maisie Fushomas  Register   On: 04/18/2017 12:24   [place] Renal  Result Date: 04/17/2017 CLINICAL DATA:  Acute renal injury.  Osteomyelitis, diabetes. EXAM: RENAL / URINARY TRACT ULTRASOUND COMPLETE COMPARISON:  None in PACs FINDINGS: Right Kidney: Length: 14.1 cm. The renal cortical echotexture exceeds that of the adjacent liver. There is no hydronephrosis or cystic or solid mass. Left Kidney: Length: 14.9 cm. The renal cortical echotexture is increased similar to that on the right. There is no hydronephrosis or cystic or solid mass. Bladder: Appears normal for degree of bladder distention. IMPRESSION: Increased renal cortical echotexture compatible with medical renal disease. No hydronephrosis. Electronically Signed  By: David  [place] M.D.   On: 04/17/2017 09:34   Dg Abd Portable 1v  Result Date: 04/16/2017 CLINICAL DATA:  Vomiting for the past 3 days. EXAM: PORTABLE ABDOMEN - 1 VIEW COMPARISON:  None in PACs FINDINGS: A single supine  view of the abdomen and pelvis is reviewed. The hemidiaphragms are excluded from the study. The bowel gas pattern is within the limits of normal. There is no evidence of ileus or obstruction. No free extraluminal gas collections are observed. There are no abnormal soft tissue calcifications. The bony structures are unremarkable. IMPRESSION: No acute intra-abdominal abnormality is observed. Electronically Signed   By: David  [place] M.D.   On: 04/16/2017 15:53   Dg Foot Complete Right  Result Date: 04/11/2017 CLINICAL DATA:  Pt has necrotic 2 toe on his right foot. PT states his toe nail fell of at the end of January, after that he notices some swelling and pain in toe. His toe became infected and he tried to treat at home. Pt is has fever chills and some nv. EXAM: RIGHT FOOT COMPLETE - 3+ VIEW COMPARISON:  None. FINDINGS: There is erosion of the tuft of the distal phalanx second digit back to the level of the proximal metaphysis. No subcutaneous gas clearly noted. IMPRESSION: Osteomyelitis of the distal phalanx second digit Electronically Signed   By: Genevive Bi M.D.   On: 04/11/2017 17:23     LOS: 8 days   Jeoffrey Massed, MD  [facility] Pager:[phone]  If 7PM-7AM, please contact night-coverage [email or web address] Password [facility] 04/19/2017, 10:58 AM

## 2017-04-20 DIAGNOSIS — R112 Nausea with vomiting, unspecified: Secondary | ICD-10-CM

## 2017-04-20 DIAGNOSIS — I10 Essential (primary) hypertension: Secondary | ICD-10-CM

## 2017-04-20 DIAGNOSIS — N179 Acute kidney failure, unspecified: Secondary | ICD-10-CM

## 2017-04-20 LAB — BASIC METABOLIC PANEL
Anion gap: 16 — ABNORMAL HIGH (ref 5–15)
BUN: 6 mg/dL (ref 6–20)
CO2: 24 mmol/L (ref 22–32)
Calcium: 8.8 mg/dL — ABNORMAL LOW (ref 8.9–10.3)
Chloride: 103 mmol/L (ref 101–111)
Creatinine, Ser: 2.12 mg/dL — ABNORMAL HIGH (ref 0.61–1.24)
GFR calc Af Amer: 46 mL/min — ABNORMAL LOW (ref >60)
GFR calc non Af Amer: 40 mL/min — ABNORMAL LOW (ref >60)
Glucose, Bld: 114 mg/dL — ABNORMAL HIGH (ref 65–99)
Potassium: 3.1 mmol/L — ABNORMAL LOW (ref 3.5–5.1)
Sodium: 143 mmol/L (ref 135–145)

## 2017-04-20 LAB — GLUCOSE, CAPILLARY
Glucose-Capillary: 99 mg/dL (ref 65–99)
Glucose-Capillary: 118 mg/dL — ABNORMAL HIGH (ref 65–99)
Glucose-Capillary: 77 mg/dL (ref 65–99)

## 2017-04-20 MED ORDER — ENSURE ENLIVE PO LIQD: 237.0000 mL | ORAL | 0 refills | Status: DC

## 2017-04-20 MED ORDER — POTASSIUM CHLORIDE CRYS ER 20 MEQ PO TBCR
40.0000 meq | EXTENDED_RELEASE_TABLET | Freq: Once | ORAL | Status: AC
  Administered 2017-04-20: 40 meq via ORAL
  Filled 2017-04-20: qty 2

## 2017-04-20 MED ORDER — INSULIN NPH ISOPHANE & REGULAR (70-30) 100 UNIT/ML ~~LOC~~ SUSP: 15.0000 [IU] | Freq: Two times a day (BID) | SUBCUTANEOUS | 0 refills | Status: DC

## 2017-04-20 MED ORDER — PREMIER PROTEIN SHAKE: 2.0000 [oz_av] | ORAL | 0 refills | Status: DC

## 2017-04-20 MED ORDER — METOCLOPRAMIDE HCL 5 MG PO TABS: 5.0000 mg | ORAL_TABLET | Freq: Four times a day (QID) | ORAL | 0 refills | Status: DC | PRN

## 2017-04-20 MED ORDER — AMLODIPINE BESYLATE 10 MG PO TABS: 10.0000 mg | ORAL_TABLET | Freq: Every day | ORAL | 0 refills | Status: DC

## 2017-04-20 MED ORDER — PANTOPRAZOLE SODIUM 40 MG PO TBEC: 40.0000 mg | DELAYED_RELEASE_TABLET | Freq: Every day | ORAL | 0 refills | Status: DC

## 2017-04-20 NOTE — Progress Notes (Signed)
Patient was discharged home with home health by MD order; discharged instructions  review and give to patient with care notes and prescriptions; IV DIC; dressing intact on right foot; patient will be escorted to the car by nurse tech via wheelchair.

## 2017-04-20 NOTE — Care Management Note (Signed)
Case Management Note  Patient Details  Name: Naji Mehringer MRN: [id number] Date of Birth: 1985-07-14  Subjective/Objective:                    32 yo M with hx of DM admitted for acute osteomyelitis and gangrene right second toe. He is s/p 2nd ray amputation  Action/Plan:  Received referral to assist pt with D/C needs, meds, no insurance. PT is recommending a RW   Expected Discharge Date:    04/20/17              Expected Discharge Plan:  Home/Self Care  In-House Referral:     Discharge planning Services  CM Consult  Post Acute Care Choice:  Home Health Choice offered to:     DME Arranged:   RW DME Agency:   [facility]  HH Arranged:    [place] Agency:     Status of Service:  Completed, signed off  If discussed at [employer], dates discussed:    Additional Comments: Met with pt and aunt Thayer Headings). Pt plans to return home with the support of his mother and aunt. Discussed prescriptions. Informed pt that he can purchase his new meds at [facility] for $4. He stated that he can afford to pay for his meds. Aunt requested a glucometer. Informed them that he can purchase a glucometer at [facility] for $10-20. She verbalized understanding. Provided pt with a brochure of the [place]. Encouraged them to contact the clinic Monday morning. Discussed the importance of following with a doctor after being in the hospital.   Contacted Reggie at [facility] for DME referral.  Norina Buzzard, RN 04/20/2017, 9:56 AM

## 2017-04-20 NOTE — Progress Notes (Signed)
Feels much better-no vomiting for almost 2 days Tolerating advancement in diet Renal function has improved He is asking to be discharged home today Have asked RN to advance to regular diet-counseled regarding importance of small portion meals and tight glycemic control to prevent future episodes of gastroparesis flare.  Also asked RN to ambulate and see how he does.  But I suspect he should be able to go home later today.  Both he and his aunt at bedside are aware that he needs to go to his primary MDs office within 1 week and get a chemistry panel checked to see if his kidney function has improved further.  See discharge summary for further details

## 2017-04-20 NOTE — Discharge Summary (Signed)
PATIENT DETAILS Name: Jerry Arnold Age: 32 y.o. Sex: male Date of Birth: 20-Nov-1985 MRN: [id number]. Admitting Physician: Pearson GrippeJames Kim, MD AOZ:[id number]:Patient, No Pcp Per  Admit Date: 04/11/2017 Discharge date: 04/20/2017  Recommendations for Outpatient Follow-up:  1. Follow up with PCP in 1-2 weeks 2. Please obtain BMP/CBC in one week 3. Please ensure follow up with Orthopedics in 1 week  Admitted From:  Home  Disposition: Home   Home Health: No  Equipment/Devices: Rolling walker  Discharge Condition: Stable  CODE STATUS: FULL CODE  Diet recommendation:  Heart Healthy / Carb Modified  Brief Summary: See H&P, Labs, Consult and Test reports for all details in brief, Patient is a 32 y.o. male with history of insulin-dependent diabetes-presented with acute ostium myelitis and gangrenous changes of the right second toe, patient was evaluated by orthopedics and underwent a right second ray amputation. Postoperative hospital course was complicated by intractable nausea with vomiting, and acute kidney injury.  See below for further details  Brief Hospital Course: Right second toe osteomyelitis with gangrene: Initially on broad-spectrum antimicrobial therapy-evaluated by orthopedics and underwent secondary amputation on 2/23.  Wound cultures positive for group B strep and MSSA-since orthopedics (see note on 2/25) recommending only a few days of antimicrobial therapy postoperatively-all antimicrobial therapy has been discontinued as of 2/27.Remains stable-afebrile-stable for discharge with close follow up with Dr Lajoyce Cornersuda.   Intractable nausea with vomiting likely secondary to gastroparesis:  Resolved with initiation of antiemetics-scheduled Zofran and Reglan.  GI was consulted-patient refused endoscopy.  High suspicion for gastroparesis even though not formally diagnosed.  Diet has been advanced-which patient seems to have tolerated well.  Change Reglan to as needed, counseled regarding  importance of small portion meals.  GI recommending PPI on discharge.  Patient aware that he needs tight glycemic control to prevent future episodes.    Acute kidney injury: Likely hemodynamically mediated in the setting of vomiting-renal ultrasound negative for hydronephrosis, no significant proteinuria seen on urinalysis.  Renal function has now started to improve-patient is euvolemic on exam-he is easily able to consume liquids now that he has stopped vomiting.  His creatinine downtrending which is supportive care, suspect he is stable to be discharged home-with close outpatient follow-up with PCP, have instructed patient that he needs repeat blood work within 1 week to ensure renal function continues to improve.  He is to avoid nonsteroidal anti-inflammatory medications only take medications at the direction of his primary care MD.   Hypertension: Blood pressure was uncontrolled-but now with better control of his vomiting-improving renal function-pretty well controlled with amlodipine 10 mg daily-this is been continued on discharge.  Further optimization deferred to the outpatient setting.    Insulin-dependent diabetes: CBGs relatively stable with Lantus and SSI-since vomiting has greatly improved-we will go ahead and switch him back to insulin 70/30-this is apparently what patient takes at home.  CBGs remained stable with insulin 70/30 15 units twice daily-this has been continued on discharge-further optimization deferred to the outpatient setting.  Metformin on hold due to renal failure.  Procedures/Studies: 2/23 SECOND FOOT RAY AMPUTATION  Discharge Diagnoses:  Principal Problem:   Acute osteomyelitis of toe, right (HCC) Active Problems:   Diabetes (HCC)   Gangrene of toe of right foot (HCC)   Cellulitis of foot   AKI (acute kidney injury) (HCC)   Nausea and vomiting   Discharge Instructions:  Activity:  Nonweightbearing to the right foot   Discharge Instructions    Call MD  for:  redness, tenderness, or signs  of infection (pain, swelling, redness, odor or green/yellow discharge around incision site)   Complete by:  As directed    Call MD for:  severe uncontrolled pain   Complete by:  As directed    Diet - low sodium heart healthy   Complete by:  As directed    Discharge instructions   Complete by:  As directed    Follow with Primary MD in 1 week and Dr Lajoyce Corners in 1 week  Strict nonweightbearing on his foot- Dressing to remain in place and changed at office follow-up  Please get a complete blood count and chemistry panel checked by your Primary MD in 1 week, to ensure your kidney function as improved.  Get Medicines reviewed and adjusted: Please take all your medications with you for your next visit with your Primary MD  Laboratory/radiological data: Please request your Primary MD to go over all hospital tests and procedure/radiological results at the follow up, please ask your Primary MD to get all Hospital records sent to his/her office.  In some cases, they will be blood work, cultures and biopsy results pending at the time of your discharge. Please request that your primary care M.D. follows up on these results.  Also Note the following: If you experience worsening of your admission symptoms, develop shortness of breath, life threatening emergency, suicidal or homicidal thoughts you must seek medical attention immediately by calling 911 or calling your MD immediately  if symptoms less severe.  You must read complete instructions/literature along with all the possible adverse reactions/side effects for all the Medicines you take and that have been prescribed to you. Take any new Medicines after you have completely understood and accpet all the possible adverse reactions/side effects.   Do not drive when taking Pain medications or sleeping medications (Benzodaizepines)  Do not take more than prescribed Pain, Sleep and Anxiety Medications. It is not advisable  to combine anxiety,sleep and pain medications without talking with your primary care practitioner  Special Instructions: If you have smoked or chewed Tobacco  in the last 2 yrs please stop smoking, stop any regular Alcohol  and or any Recreational drug use.  Wear Seat belts while driving.  Please note: You were cared for by a hospitalist during your hospital stay. Once you are discharged, your primary care physician will handle any further medical issues. Please note that NO REFILLS for any discharge medications will be authorized once you are discharged, as it is imperative that you return to your primary care physician (or establish a relationship with a primary care physician if you do not have one) for your post hospital discharge needs so that they can reassess your need for medications and monitor your lab values.   Increase activity slowly   Complete by:  As directed    Strict nonweightbearing on his foot     Allergies as of 04/20/2017   No Known Allergies     Medication List    STOP taking these medications   clindamycin-benzoyl peroxide gel Commonly known as:  BENZACLIN   doxycycline 100 MG capsule Commonly known as:  VIBRAMYCIN   HYDROcodone-acetaminophen 5-325 MG tablet Commonly known as:  NORCO   ibuprofen 200 MG tablet Commonly known as:  ADVIL,MOTRIN   insulin aspart protamine- aspart (70-30) 100 UNIT/ML injection Commonly known as:  NOVOLOG MIX 70/30   metFORMIN 500 MG tablet Commonly known as:  GLUCOPHAGE     TAKE these medications   amLODipine 10 MG tablet Commonly known as:  NORVASC Take 1 tablet (10 mg total) by mouth daily. Start taking on:  04/21/2017   feeding supplement (ENSURE ENLIVE) Liqd Take 237 mLs by mouth daily.   insulin NPH-regular Human (70-30) 100 UNIT/ML injection Commonly known as:  NOVOLIN 70/30 Inject 15 Units into the skin 2 (two) times daily with a meal.   metoCLOPramide 5 MG tablet Commonly known as:  REGLAN Take 1 tablet (5  mg total) by mouth every 6 (six) hours as needed for nausea.   pantoprazole 40 MG tablet Commonly known as:  PROTONIX Take 1 tablet (40 mg total) by mouth daily at 6 (six) AM. Start taking on:  04/21/2017   protein supplement shake Liqd Commonly known as:  PREMIER PROTEIN Take 59.1 mLs (2 oz total) by mouth daily. Start taking on:  04/21/2017            Durable Medical Equipment  (From admission, onward)        Start     Ordered   04/20/17 1004  For home use only DME Walker rolling  Once    Question:  Patient needs a walker to treat with the following condition  Answer:  Diabetic foot (HCC)   04/20/17 1003     Follow-up Information    Nadara Mustard, MD Follow up in 1 week(s).   Specialty:  Orthopedic Surgery Contact information: [address] [place] [address] [phone]        [facility]. Go on 04/24/2017.   Why:  2:30 pm with Dr. Emmaline Life information: [address] [place] [address] [phone]         No Known Allergies  Consultations:   GI and orthopedic surgery  Other Procedures/Studies: Dg Chest 2 View  Result Date: 04/18/2017 CLINICAL DATA:  Cough and vomiting. EXAM: CHEST  2 VIEW COMPARISON:  03/23/2015. FINDINGS: Mediastinum and hilar structures normal. Mild right mid lung and left base subsegmental atelectasis/infiltrate. No pleural effusion or pneumothorax. Heart size normal. No acute bony abnormality. IMPRESSION: Mild right mid lung and left base subsegmental atelectasis/infiltrate. Electronically Signed   By: Maisie Fus  Register   On: 04/18/2017 12:24   US Renal  Result Date: 04/17/2017 CLINICAL DATA:  Acute renal injury.  Osteomyelitis, diabetes. EXAM: RENAL / URINARY TRACT ULTRASOUND COMPLETE COMPARISON:  None in PACs FINDINGS: Right Kidney: Length: 14.1 cm. The renal cortical echotexture exceeds that of the adjacent liver. There is no hydronephrosis or cystic or solid  mass. Left Kidney: Length: 14.9 cm. The renal cortical echotexture is increased similar to that on the right. There is no hydronephrosis or cystic or solid mass. Bladder: Appears normal for degree of bladder distention. IMPRESSION: Increased renal cortical echotexture compatible with medical renal disease. No hydronephrosis. Electronically Signed   By: David  [place] M.D.   On: 04/17/2017 09:34   Dg Abd Portable 1v  Result Date: 04/16/2017 CLINICAL DATA:  Vomiting for the past 3 days. EXAM: PORTABLE ABDOMEN - 1 VIEW COMPARISON:  None in PACs FINDINGS: A single supine view of the abdomen and pelvis is reviewed. The hemidiaphragms are excluded from the study. The bowel gas pattern is within the limits of normal. There is no evidence of ileus or obstruction. No free extraluminal gas collections are observed. There are no abnormal soft tissue calcifications. The bony structures are unremarkable. IMPRESSION: No acute intra-abdominal abnormality is observed. Electronically Signed   By: David  [place] M.D.   On: 04/16/2017 15:53   Dg Foot Complete  Right  Result Date: 04/11/2017 CLINICAL DATA:  Pt has necrotic 2 toe on his right foot. PT states his toe nail fell of at the end of January, after that he notices some swelling and pain in toe. His toe became infected and he tried to treat at home. Pt is has fever chills and some nv. EXAM: RIGHT FOOT COMPLETE - 3+ VIEW COMPARISON:  None. FINDINGS: There is erosion of the tuft of the distal phalanx second digit back to the level of the proximal metaphysis. No subcutaneous gas clearly noted. IMPRESSION: Osteomyelitis of the distal phalanx second digit Electronically Signed   By: Genevive Bi M.D.   On: 04/11/2017 17:23      TODAY-DAY OF DISCHARGE:  Subjective:   Jerry Arnold today has no headache,no chest abdominal pain,no new weakness tingling or numbness, feels much better wants to go home today.   Objective:   Blood pressure (!) 140/100, pulse 94,  temperature 98.3 F (36.8 C), temperature source Oral, resp. rate 18, height 6\' 1"  (1.854 m), weight 105.1 kg (231 lb 11.3 oz), SpO2 97 %.  Intake/Output Summary (Last 24 hours) at 04/20/2017 1015 Last data filed at 04/20/2017 0918 Gross per 24 hour  Intake 2151.33 ml  Output 2150 ml  Net 1.33 ml   Filed Weights   04/19/17 0500 04/19/17 0731 04/20/17 0524  Weight: 104.6 kg (230 lb 9.6 oz) 104.3 kg (230 lb) 105.1 kg (231 lb 11.3 oz)    Exam: Awake Alert, Oriented *3, No new F.N deficits, Normal affect [place].AT,PERRAL Supple Neck,No JVD, No cervical lymphadenopathy appriciated.  Symmetrical Chest wall movement, Good air movement bilaterally, CTAB RRR,No Gallops,Rubs or new Murmurs, No Parasternal Heave +ve B.Sounds, Abd Soft, Non tender, No organomegaly appriciated, No rebound -guarding or rigidity. No Cyanosis, Clubbing or edema, No new Rash or bruise   PERTINENT RADIOLOGIC STUDIES: Dg Chest 2 View  Result Date: 04/18/2017 CLINICAL DATA:  Cough and vomiting. EXAM: CHEST  2 VIEW COMPARISON:  03/23/2015. FINDINGS: Mediastinum and hilar structures normal. Mild right mid lung and left base subsegmental atelectasis/infiltrate. No pleural effusion or pneumothorax. Heart size normal. No acute bony abnormality. IMPRESSION: Mild right mid lung and left base subsegmental atelectasis/infiltrate. Electronically Signed   By: Maisie Fus  Register   On: 04/18/2017 12:24   US Renal  Result Date: 04/17/2017 CLINICAL DATA:  Acute renal injury.  Osteomyelitis, diabetes. EXAM: RENAL / URINARY TRACT ULTRASOUND COMPLETE COMPARISON:  None in PACs FINDINGS: Right Kidney: Length: 14.1 cm. The renal cortical echotexture exceeds that of the adjacent liver. There is no hydronephrosis or cystic or solid mass. Left Kidney: Length: 14.9 cm. The renal cortical echotexture is increased similar to that on the right. There is no hydronephrosis or cystic or solid mass. Bladder: Appears normal for degree of bladder distention.  IMPRESSION: Increased renal cortical echotexture compatible with medical renal disease. No hydronephrosis. Electronically Signed   By: David  [place] M.D.   On: 04/17/2017 09:34   Dg Abd Portable 1v  Result Date: 04/16/2017 CLINICAL DATA:  Vomiting for the past 3 days. EXAM: PORTABLE ABDOMEN - 1 VIEW COMPARISON:  None in PACs FINDINGS: A single supine view of the abdomen and pelvis is reviewed. The hemidiaphragms are excluded from the study. The bowel gas pattern is within the limits of normal. There is no evidence of ileus or obstruction. No free extraluminal gas collections are observed. There are no abnormal soft tissue calcifications. The bony structures are unremarkable. IMPRESSION: No acute intra-abdominal abnormality is observed. Electronically Signed  By: David  [place] M.D.   On: 04/16/2017 15:53   Dg Foot Complete Right  Result Date: 04/11/2017 CLINICAL DATA:  Pt has necrotic 2 toe on his right foot. PT states his toe nail fell of at the end of January, after that he notices some swelling and pain in toe. His toe became infected and he tried to treat at home. Pt is has fever chills and some nv. EXAM: RIGHT FOOT COMPLETE - 3+ VIEW COMPARISON:  None. FINDINGS: There is erosion of the tuft of the distal phalanx second digit back to the level of the proximal metaphysis. No subcutaneous gas clearly noted. IMPRESSION: Osteomyelitis of the distal phalanx second digit Electronically Signed   By: Genevive Bi M.D.   On: 04/11/2017 17:23     PERTINENT LAB RESULTS: CBC: No results for input(s): WBC, HGB, HCT, PLT in the last 72 hours. CMET CMP     Component Value Date/Time   NA 143 04/20/2017 0321   K 3.1 (L) 04/20/2017 0321   CL 103 04/20/2017 0321   CO2 24 04/20/2017 0321   GLUCOSE 114 (H) 04/20/2017 0321   BUN 6 04/20/2017 0321   CREATININE 2.12 (H) 04/20/2017 0321   CALCIUM 8.8 (L) 04/20/2017 0321   PROT 7.4 04/17/2017 0454   ALBUMIN 2.3 (L) 04/17/2017 0454   AST 14 (L) 04/17/2017  0454   ALT 9 (L) 04/17/2017 0454   ALKPHOS 75 04/17/2017 0454   BILITOT 0.5 04/17/2017 0454   GFRNONAA 40 (L) 04/20/2017 0321   GFRAA 46 (L) 04/20/2017 0321    GFR Estimated Creatinine Clearance: 63.7 mL/min (A) (by C-G formula based on SCr of 2.12 mg/dL (H)). No results for input(s): LIPASE, AMYLASE in the last 72 hours. No results for input(s): CKTOTAL, CKMB, CKMBINDEX, TROPONINI in the last 72 hours. Invalid input(s): POCBNP No results for input(s): DDIMER in the last 72 hours. No results for input(s): HGBA1C in the last 72 hours. No results for input(s): CHOL, HDL, LDLCALC, TRIG, CHOLHDL, LDLDIRECT in the last 72 hours. No results for input(s): TSH, T4TOTAL, T3FREE, THYROIDAB in the last 72 hours.  Invalid input(s): FREET3 Recent Labs    04/19/17 0539  VITAMINB12 205  FOLATE 14.2  FERRITIN 264  TIBC 188*  IRON 27*  RETICCTPCT 1.3   Coags: No results for input(s): INR in the last 72 hours.  Invalid input(s): PT Microbiology: Recent Results (from the past 240 hour(s))  Culture, blood (Routine x 2)     Status: None   Collection Time: 04/11/17  5:00 PM  Result Value Ref Range Status   Specimen Description BLOOD LEFT ANTECUBITAL  Final   Special Requests   Final    BOTTLES DRAWN AEROBIC AND ANAEROBIC Blood Culture adequate volume   Culture   Final    NO GROWTH 5 DAYS Performed at [facility], [address], [place], [place] [address]    Report Status 04/16/2017 FINAL  Final  Culture, blood (Routine x 2)     Status: None   Collection Time: 04/11/17  5:20 PM  Result Value Ref Range Status   Specimen Description BLOOD RIGHT ANTECUBITAL  Final   Special Requests   Final    BOTTLES DRAWN AEROBIC AND ANAEROBIC Blood Culture results may not be optimal due to an excessive volume of blood received in culture bottles   Culture   Final    NO GROWTH 5 DAYS Performed at [facility], [address] [place], [place] [address]  Report Status 04/16/2017 FINAL   Final  Surgical pcr screen     Status: Abnormal   Collection Time: 04/12/17  3:47 AM  Result Value Ref Range Status   MRSA, PCR NEGATIVE NEGATIVE Final   Staphylococcus aureus POSITIVE (A) NEGATIVE Final    Comment: (NOTE) The Xpert SA Assay (FDA approved for NASAL specimens in patients 17 years of age and older), is one component of a comprehensive surveillance program. It is not intended to diagnose infection nor to guide or monitor treatment. Performed at [facility], [address] [place], [place] [address]   Aerobic/Anaerobic Culture (surgical/deep wound)     Status: None   Collection Time: 04/13/17  8:10 AM  Result Value Ref Range Status   Specimen Description ULCER RIGHT FOOT  Final   Special Requests NONE  Final   Gram Stain   Final    FEW WBC PRESENT, PREDOMINANTLY PMN RARE GRAM POSITIVE COCCI IN PAIRS RARE GRAM POSITIVE RODS    Culture   Final    FEW GROUP B STREP(S.AGALACTIAE)ISOLATED TESTING AGAINST S. AGALACTIAE NOT ROUTINELY PERFORMED DUE TO PREDICTABILITY OF AMP/PEN/VAN SUSCEPTIBILITY. RARE STAPHYLOCOCCUS AUREUS NO ANAEROBES ISOLATED Performed at [facility], [address], [place], [place] [address]    Report Status 04/18/2017 FINAL  Final   Organism ID, Bacteria STAPHYLOCOCCUS AUREUS  Final      Susceptibility   Staphylococcus aureus - MIC*    CIPROFLOXACIN <=0.5 SENSITIVE Sensitive     ERYTHROMYCIN >=8 RESISTANT Resistant     GENTAMICIN <=0.5 SENSITIVE Sensitive     OXACILLIN 0.5 SENSITIVE Sensitive     TETRACYCLINE <=1 SENSITIVE Sensitive     VANCOMYCIN 1 SENSITIVE Sensitive     TRIMETH/SULFA <=10 SENSITIVE Sensitive     CLINDAMYCIN >=8 RESISTANT Resistant     RIFAMPIN <=0.5 SENSITIVE Sensitive     Inducible Clindamycin NEGATIVE Sensitive     * RARE STAPHYLOCOCCUS AUREUS  Culture, blood (Routine X 2) w Reflex to ID Panel     Status: None   Collection Time: 04/13/17  2:29 PM  Result Value Ref Range Status   Specimen Description BLOOD  RIGHT ANTECUBITAL  Final   Special Requests   Final    BOTTLES DRAWN AEROBIC AND ANAEROBIC Blood Culture adequate volume   Culture   Final    NO GROWTH 5 DAYS Performed at [facility], [address] [place], [place] [address]    Report Status 04/18/2017 FINAL  Final  Culture, blood (Routine X 2) w Reflex to ID Panel     Status: None   Collection Time: 04/13/17  2:30 PM  Result Value Ref Range Status   Specimen Description BLOOD LEFT HAND  Final   Special Requests   Final    BOTTLES DRAWN AEROBIC AND ANAEROBIC Blood Culture adequate volume   Culture   Final    NO GROWTH 5 DAYS Performed at [facility], [address], [place], [place] [address]    Report Status 04/18/2017 FINAL  Final    FURTHER DISCHARGE INSTRUCTIONS:  Get Medicines reviewed and adjusted: Please take all your medications with you for your next visit with your Primary MD  Laboratory/radiological data: Please request your Primary MD to go over all hospital tests and procedure/radiological results at the follow up, please ask your Primary MD to get all Hospital records sent to his/her office.  In some cases, they will be blood work, cultures and biopsy results pending at the time  of your discharge. Please request that your primary care M.D. goes through all the records of your hospital data and follows up on these results.  Also Note the following: If you experience worsening of your admission symptoms, develop shortness of breath, life threatening emergency, suicidal or homicidal thoughts you must seek medical attention immediately by calling 911 or calling your MD immediately  if symptoms less severe.  You must read complete instructions/literature along with all the possible adverse reactions/side effects for all the Medicines you take and that have been prescribed to you. Take any new Medicines after you have completely understood and accpet all the possible adverse reactions/side effects.   Do  not drive when taking Pain medications or sleeping medications (Benzodaizepines)  Do not take more than prescribed Pain, Sleep and Anxiety Medications. It is not advisable to combine anxiety,sleep and pain medications without talking with your primary care practitioner  Special Instructions: If you have smoked or chewed Tobacco  in the last 2 yrs please stop smoking, stop any regular Alcohol  and or any Recreational drug use.  Wear Seat belts while driving.  Please note: You were cared for by a hospitalist during your hospital stay. Once you are discharged, your primary care physician will handle any further medical issues. Please note that NO REFILLS for any discharge medications will be authorized once you are discharged, as it is imperative that you return to your primary care physician (or establish a relationship with a primary care physician if you do not have one) for your post hospital discharge needs so that they can reassess your need for medications and monitor your lab values.  Total Time spent coordinating discharge including counseling, education and face to face time equals  45 minutes.  SignedJeoffrey Massed 04/20/2017 10:15 AM

## 2017-04-24 ENCOUNTER — Encounter (INDEPENDENT_AMBULATORY_CARE_PROVIDER_SITE_OTHER): Payer: Self-pay | Admitting: Orthopedic Surgery

## 2017-04-24 ENCOUNTER — Ambulatory Visit: Payer: Self-pay | Attending: Internal Medicine | Admitting: Physician Assistant

## 2017-04-24 ENCOUNTER — Ambulatory Visit (INDEPENDENT_AMBULATORY_CARE_PROVIDER_SITE_OTHER): Payer: Self-pay | Admitting: Orthopedic Surgery

## 2017-04-24 VITALS — BP 143/98 | HR 105 | Temp 98.8°F | Resp 16 | Ht 73.0 in | Wt 232.2 lb

## 2017-04-24 VITALS — Ht 73.0 in | Wt 232.0 lb

## 2017-04-24 DIAGNOSIS — Z89431 Acquired absence of right foot: Secondary | ICD-10-CM | POA: Insufficient documentation

## 2017-04-24 DIAGNOSIS — M868X8 Other osteomyelitis, other site: Secondary | ICD-10-CM | POA: Insufficient documentation

## 2017-04-24 DIAGNOSIS — I10 Essential (primary) hypertension: Secondary | ICD-10-CM | POA: Insufficient documentation

## 2017-04-24 DIAGNOSIS — Z8249 Family history of ischemic heart disease and other diseases of the circulatory system: Secondary | ICD-10-CM | POA: Insufficient documentation

## 2017-04-24 DIAGNOSIS — K3184 Gastroparesis: Secondary | ICD-10-CM | POA: Insufficient documentation

## 2017-04-24 DIAGNOSIS — S98131A Complete traumatic amputation of one right lesser toe, initial encounter: Secondary | ICD-10-CM

## 2017-04-24 DIAGNOSIS — J029 Acute pharyngitis, unspecified: Secondary | ICD-10-CM | POA: Insufficient documentation

## 2017-04-24 DIAGNOSIS — Z794 Long term (current) use of insulin: Secondary | ICD-10-CM | POA: Insufficient documentation

## 2017-04-24 DIAGNOSIS — E1143 Type 2 diabetes mellitus with diabetic autonomic (poly)neuropathy: Secondary | ICD-10-CM | POA: Insufficient documentation

## 2017-04-24 DIAGNOSIS — N179 Acute kidney failure, unspecified: Secondary | ICD-10-CM | POA: Insufficient documentation

## 2017-04-24 DIAGNOSIS — E1121 Type 2 diabetes mellitus with diabetic nephropathy: Secondary | ICD-10-CM

## 2017-04-24 DIAGNOSIS — Z89421 Acquired absence of other right toe(s): Secondary | ICD-10-CM

## 2017-04-24 DIAGNOSIS — Z79899 Other long term (current) drug therapy: Secondary | ICD-10-CM | POA: Insufficient documentation

## 2017-04-24 DIAGNOSIS — M869 Osteomyelitis, unspecified: Secondary | ICD-10-CM

## 2017-04-24 LAB — GLUCOSE, POCT (MANUAL RESULT ENTRY)
POC Glucose: 119 mg/dl — AB (ref 70–99)
POC Glucose: 67 mg/dl — AB (ref 70–99)

## 2017-04-24 MED ORDER — INSULIN NPH ISOPHANE & REGULAR (70-30) 100 UNIT/ML ~~LOC~~ SUSP: 15.0000 [IU] | Freq: Two times a day (BID) | SUBCUTANEOUS | 3 refills | Status: DC

## 2017-04-24 MED ORDER — AMLODIPINE BESYLATE 10 MG PO TABS: 10.0000 mg | ORAL_TABLET | Freq: Every day | ORAL | 3 refills | Status: DC

## 2017-04-24 MED ORDER — METOCLOPRAMIDE HCL 5 MG PO TABS: 5.0000 mg | ORAL_TABLET | Freq: Four times a day (QID) | ORAL | 3 refills | Status: DC | PRN

## 2017-04-24 MED ORDER — PANTOPRAZOLE SODIUM 40 MG PO TBEC: 40.0000 mg | DELAYED_RELEASE_TABLET | Freq: Every day | ORAL | 3 refills | Status: DC

## 2017-04-24 NOTE — Addendum Note (Signed)
Addended by: Particia LatherPOLLOCK, JAY'A R on: 04/24/2017 03:23 PM   Modules accepted: Orders

## 2017-04-24 NOTE — Progress Notes (Signed)
Jerry Arnold  ZOX:[id number]  WJX:[id number]  DOB - 1985-02-22  Chief Complaint  Patient presents with  . Hospitalization Follow-up       Subjective:   Jerry Arnold is a 32 y.o. male here today for establishment of care. He has a past medical history diabetes mellitus type 2 dictating greater than 10 years. He previously was cared for in [place]. He has lived here for 5-6 units not had any medical care. He was given himself over-the-counter NovoLog 70/30. At some point he was on metformin.  He lost a toenail on the second toe of the right foot back in January and has had an ongoing infection since then. A few days prior to his February admission his toe turned black and he started experiencing fevers. On 04/11/2017 he reported to the emergency department an x-ray showed osteomyelitis/gangrene. His white blood cell count was 11,000 he was tachycardic and his blood pressure was up. He was admitted by the internal medicine team and started on antibiotics. Or so was consult it and Dr. Lajoyce Corners. A right second toe ray amputation on 04/13/2017. His hospital course was complicated by nausea. GI did see him and offered endoscopy which he declined. They felt like this was likely related to gastroparesis. He was started on a PPI and told to take Reglan as needed. His course was also complicated by acute kidney injury with creatinine peaking at 2.3. Down to 2.1 at discharge. His A1c was 14%. Metformin was discontinued secondary to the kidney issues area and his wound cultures did grow group B strep and MSSA.  He has a blood sugar meter at home now. His sugars again from 80s to 160s. He is off metformin on 70 3015 units twice daily. He has not been taking his stomach pill. He has an appointment today with sore throat. He has been taking proper care of his surgical site. No new complaints today.  ROS: GEN: denies fever or chills, denies change in weight Skin: denies lesions or rashes HEENT:  denies headache, earache, epistaxis, sore throat, or neck pain LUNGS: denies SHOB, dyspnea, PND, orthopnea CV: denies CP or palpitations ABD: denies abd pain, N or V EXT: denies muscle spasms or swelling; no pain in lower ext, no weakness NEURO: denies numbness or tingling, denies sz, stroke or TIA  ALLERGIES: No Known Allergies  PAST MEDICAL HISTORY: Past Medical History:  Diagnosis Date  . Osteomyelitis (HCC) 04/11/2017   with "second toe on right foot" injury x 2-3 weeks (04/11/2017)  . Type II diabetes mellitus (HCC)     PAST SURGICAL HISTORY: Past Surgical History:  Procedure Laterality Date  . AMPUTATION Right 04/13/2017   Procedure: SECOND FOOT RAY AMPUTATION;  Surgeon: Nadara Mustard, MD;  Location: [facility];  Service: Orthopedics;  Laterality: Right;  . NO PAST SURGERIES      MEDICATIONS AT HOME: Prior to Admission medications   Medication Sig Start Date End Date Taking? Authorizing Provider  amLODipine (NORVASC) 10 MG tablet Take 1 tablet (10 mg total) by mouth daily. 04/24/17   Vivianne Master, PA-C  feeding supplement, ENSURE ENLIVE, (ENSURE ENLIVE) LIQD Take 237 mLs by mouth daily. Patient not taking: Reported on 04/24/2017 04/20/17   Maretta Bees, MD  insulin NPH-regular Human (NOVOLIN 70/30) (70-30) 100 UNIT/ML injection Inject 15 Units into the skin 2 (two) times daily with a meal. 04/24/17   Danelle Earthly, Cable Fearn S, PA-C  metoCLOPramide (REGLAN) 5 MG tablet Take 1 tablet (5 mg total) by mouth  every 6 (six) hours as needed for nausea. 04/24/17   Vivianne Master, PA-C  pantoprazole (PROTONIX) 40 MG tablet Take 1 tablet (40 mg total) by mouth daily at 6 (six) AM. 04/24/17   Vivianne Master, PA-C  protein supplement shake (PREMIER PROTEIN) LIQD Take 59.1 mLs (2 oz total) by mouth daily. Patient not taking: Reported on 04/24/2017 04/21/17   Maretta Bees, MD    Family History  Problem Relation Age of Onset  . Hypertension Mother   . Hypertension Maternal Grandmother   .  Hypertension Paternal Grandmother     Objective:   Vitals:   04/24/17 1446  BP: (!) 143/98  Pulse: (!) 105  Resp: 16  Temp: 98.8 F (37.1 C)  TempSrc: Oral  SpO2: 97%  Weight: 232 lb 3.2 oz (105.3 kg)  Height:  (1.854 m)    Exam General appearance : Awake, alert, not in any distress. Speech Clear. Not toxic looking HEENT: Atraumatic and Normocephalic, pupils equally reactive to light and accomodation Chest:Good air entry bilaterally, no added sounds  CVS: S1 S2 regular, no murmurs.  Abdomen: Bowel sounds present, Non tender and not distended with no guarding, rigidity or rebound. Extremities: B/L Lower Ext shows no edema, both legs are warm to touch Neurology: Awake alert, and oriented X 3, CN II-XII intact, Non focal Skin:No Rash Wounds:right foot dressed and booted > appt with Lajoyce Corners, MD today so did not undress  Data Review Lab Results  Component Value Date   HGBA1C 14.0 (H) 04/12/2017     Assessment & Plan  1. Osteomyelitis/Gangrene 2nd Toe Right Foot s/p amputation  -Antibiotics completed  -dressing/wound care per Dr. Lajoyce Corners 2. DM 2, insulin requiring  -cont 70/30 15 U BID for now  -Off Metformin  -keep log TID-QID and bring to appt 3. Gastroparesis  -Regaln PRN  -Cont PPI 4. HTN  -BMP today  -Norvasc, may need 2nd agent at some point 5. AKI  -BMP today  -avoid nephrotoxic agents   Return in about 4 weeks (around 05/22/2017).  The patient was given clear instructions to go to ER or return to medical center if symptoms don't improve, worsen or new problems develop. The patient verbalized understanding. The patient was told to call to get lab results if they haven't heard anything in the next week.   Total time spent with patient was 33 min. Greater than 50 % of this visit was spent face to face counseling and coordinating care regarding risk factor modification, compliance importance and encouragement, education related to hospitalization review.  This  note has been created with Education officer, environmental. Any transcriptional errors are unintentional.    Scot Jun, PA-C [facility] [place], [place] [phone]   04/24/2017, 3:10 PM

## 2017-04-24 NOTE — Progress Notes (Signed)
Office Visit Note   Patient: Jerry RangSamuel Aburto           Date of Birth: 07-Feb-1986           MRN: [id number] Visit Date: 04/24/2017              Requested by: No referring provider defined for this encounter. PCP: Patient, No Pcp Per  Chief Complaint  Patient presents with  . Right Foot - Routine Post Op    04/13/17 right 2nd ray amputation       HPI: Patient is a 32 year old gentleman who presents follow-up for right foot second ray amputation.  Patient denies any problems he is full weightbearing in a postoperative shoe.  Assessment & Plan: Visit Diagnoses:  1. Amputated toe of right foot (HCC)     Plan: Discussed the importance of minimizing weightbearing the importance of elevation he will wash his foot with soap and water and apply dry dressing daily.  Follow-up in a week to remove the sutures.  Follow-Up Instructions: Return in about 1 week (around 05/01/2017).   Ortho Exam  Patient is alert, oriented, no adenopathy, well-dressed, normal affect, normal respiratory effort. Examination patient's incision is healing quite well there is a little bit of exposed granulation tissue from the swelling he has a significant amount of swelling in his foot.  Reinforced elevation and minimize weightbearing. Dressing was applied.  There is no redness no cellulitis no drainage no signs of infection.  Imaging: No results found. No images are attached to the encounter.  Labs: Lab Results  Component Value Date   HGBA1C 14.0 (H) 04/12/2017   ESRSEDRATE 122 (H) 04/12/2017   REPTSTATUS 04/18/2017 FINAL 04/13/2017   GRAMSTAIN  04/13/2017    FEW WBC PRESENT, PREDOMINANTLY PMN RARE GRAM POSITIVE COCCI IN PAIRS RARE GRAM POSITIVE RODS    CULT  04/13/2017    NO GROWTH 5 DAYS Performed at [facility], [address] [place], [place] [address]    LABORGA STAPHYLOCOCCUS AUREUS 04/13/2017    @LABSALLVALUES (HGBA1)@  Body mass index is 30.61 kg/m.  Orders:  No orders of the  defined types were placed in this encounter.  No orders of the defined types were placed in this encounter.    Procedures: No procedures performed  Clinical Data: No additional findings.  ROS:  All other systems negative, except as noted in the HPI. Review of Systems  Objective: Vital Signs: Ht 6\' 1"  (1.854 m)   Wt 232 lb (105.2 kg)   BMI 30.61 kg/m   Specialty Comments:  No specialty comments available.  PMFS History: Patient Active Problem List   Diagnosis Date Noted  . Amputated toe of right foot (HCC) 04/24/2017  . AKI (acute kidney injury) (HCC) 04/16/2017  . Nausea and vomiting 04/16/2017  . Cellulitis of foot   . Acute osteomyelitis of toe, right (HCC) 04/12/2017  . Gangrene of toe of right foot (HCC) 04/12/2017  . Anemia 04/11/2017  . Diabetes (HCC) 04/11/2017   Past Medical History:  Diagnosis Date  . Osteomyelitis (HCC) 04/11/2017   with "second toe on right foot" injury x 2-3 weeks (04/11/2017)  . Type II diabetes mellitus (HCC)     Family History  Problem Relation Age of Onset  . Hypertension Mother   . Hypertension Maternal Grandmother   . Hypertension Paternal Grandmother     Past Surgical History:  Procedure Laterality Date  . AMPUTATION Right 04/13/2017   Procedure: SECOND FOOT RAY AMPUTATION;  Surgeon: Aldean Bakeruda, Marcus  V, MD;  Location: [facility] OR;  Service: Orthopedics;  Laterality: Right;  . NO PAST SURGERIES     Social History   Occupational History  . Not on file  Tobacco Use  . Smoking status: Never Smoker  . Smokeless tobacco: Never Used  Substance and Sexual Activity  . Alcohol use: Yes    Comment: 04/11/2017 "might have a few drinks/year"  . Drug use: No  . Sexual activity: Not on file

## 2017-04-25 LAB — CBC WITH DIFFERENTIAL/PLATELET
Basophils Absolute: 0 10*3/uL (ref 0.0–0.2)
Basos: 1 %
EOS (ABSOLUTE): 0.3 10*3/uL (ref 0.0–0.4)
Eos: 3 %
Hematocrit: 34.6 % — ABNORMAL LOW (ref 37.5–51.0)
Hemoglobin: 11.3 g/dL — ABNORMAL LOW (ref 13.0–17.7)
Immature Grans (Abs): 0 10*3/uL (ref 0.0–0.1)
Immature Granulocytes: 0 %
Lymphocytes Absolute: 2.8 10*3/uL (ref 0.7–3.1)
Lymphs: 37 %
MCH: 27 pg (ref 26.6–33.0)
MCHC: 32.7 g/dL (ref 31.5–35.7)
MCV: 83 fL (ref 79–97)
Monocytes Absolute: 0.6 10*3/uL (ref 0.1–0.9)
Monocytes: 8 %
Neutrophils Absolute: 3.9 10*3/uL (ref 1.4–7.0)
Neutrophils: 51 %
Platelets: 511 10*3/uL — ABNORMAL HIGH (ref 150–379)
RBC: 4.18 x10E6/uL (ref 4.14–5.80)
RDW: 15.1 % (ref 12.3–15.4)
WBC: 7.6 10*3/uL (ref 3.4–10.8)

## 2017-04-25 LAB — BASIC METABOLIC PANEL
BUN/Creatinine Ratio: 10 (ref 9–20)
BUN: 23 mg/dL — ABNORMAL HIGH (ref 6–20)
CO2: 24 mmol/L (ref 20–29)
Calcium: 9 mg/dL (ref 8.7–10.2)
Chloride: 100 mmol/L (ref 96–106)
Creatinine, Ser: 2.26 mg/dL — ABNORMAL HIGH (ref 0.76–1.27)
GFR calc Af Amer: 43 mL/min/{1.73_m2} — ABNORMAL LOW (ref >59)
GFR calc non Af Amer: 37 mL/min/{1.73_m2} — ABNORMAL LOW (ref >59)
Glucose: 103 mg/dL — ABNORMAL HIGH (ref 65–99)
Potassium: 4.1 mmol/L (ref 3.5–5.2)
Sodium: 142 mmol/L (ref 134–144)

## 2017-04-26 ENCOUNTER — Ambulatory Visit (INDEPENDENT_AMBULATORY_CARE_PROVIDER_SITE_OTHER): Payer: Self-pay | Admitting: Orthopedic Surgery

## 2017-05-01 ENCOUNTER — Encounter (INDEPENDENT_AMBULATORY_CARE_PROVIDER_SITE_OTHER): Payer: Self-pay | Admitting: Orthopedic Surgery

## 2017-05-01 ENCOUNTER — Ambulatory Visit (INDEPENDENT_AMBULATORY_CARE_PROVIDER_SITE_OTHER): Payer: Self-pay | Admitting: Orthopedic Surgery

## 2017-05-01 VITALS — Ht 73.0 in | Wt 232.0 lb

## 2017-05-01 DIAGNOSIS — S98131A Complete traumatic amputation of one right lesser toe, initial encounter: Secondary | ICD-10-CM

## 2017-05-01 DIAGNOSIS — Z89421 Acquired absence of other right toe(s): Secondary | ICD-10-CM

## 2017-05-01 NOTE — Progress Notes (Signed)
Office Visit Note   Patient: Jerry Arnold           Date of Birth: 13-Nov-1985           MRN: [id number] Visit Date: 05/01/2017              Requested by: No referring provider defined for this encounter. PCP: Patient, No Pcp Per  Chief Complaint  Patient presents with  . Right Foot - Routine Post Op    04/13/17 right foot 2nd ray amputation       HPI: Patient is a 32 year old gentleman status post right foot second ray amputation about 3 weeks out.  Patient is full weightbearing in his postoperative shoe.  Assessment & Plan: Visit Diagnoses:  1. Amputated toe of right foot (HCC)     Plan: Recommended minimize weightbearing Dial soap cleansing daily plus a dry dressing change daily.  Follow-Up Instructions: Return in about 2 weeks (around 05/15/2017).   Ortho Exam  Patient is alert, oriented, no adenopathy, well-dressed, normal affect, normal respiratory effort. Examination the incision is almost completely healed there is one area distally that is about 5 mm in diameter with some mild ischemic changes with granulation tissue at the base.  We will harvest the sutures today there is no redness no cellulitis no drainage no odor no signs of infection.  Imaging: No results found. No images are attached to the encounter.  Labs: Lab Results  Component Value Date   HGBA1C 14.0 (H) 04/12/2017   ESRSEDRATE 122 (H) 04/12/2017   REPTSTATUS 04/18/2017 FINAL 04/13/2017   GRAMSTAIN  04/13/2017    FEW WBC PRESENT, PREDOMINANTLY PMN RARE GRAM POSITIVE COCCI IN PAIRS RARE GRAM POSITIVE RODS    CULT  04/13/2017    NO GROWTH 5 DAYS Performed at [facility], [address], [place], [place] [address]    LABORGA STAPHYLOCOCCUS AUREUS 04/13/2017    @LABSALLVALUES (HGBA1)@  Body mass index is 30.61 kg/m.  Orders:  No orders of the defined types were placed in this encounter.  No orders of the defined types were placed in this encounter.    Procedures: No  procedures performed  Clinical Data: No additional findings.  ROS:  All other systems negative, except as noted in the HPI. Review of Systems  Objective: Vital Signs: Ht 6\' 1"  (1.854 m)   Wt 232 lb (105.2 kg)   BMI 30.61 kg/m   Specialty Comments:  No specialty comments available.  PMFS History: Patient Active Problem List   Diagnosis Date Noted  . Amputated toe of right foot (HCC) 04/24/2017  . AKI (acute kidney injury) (HCC) 04/16/2017  . Nausea and vomiting 04/16/2017  . Cellulitis of foot   . Acute osteomyelitis of toe, right (HCC) 04/12/2017  . Gangrene of toe of right foot (HCC) 04/12/2017  . Anemia 04/11/2017  . Diabetes (HCC) 04/11/2017   Past Medical History:  Diagnosis Date  . Osteomyelitis (HCC) 04/11/2017   with "second toe on right foot" injury x 2-3 weeks (04/11/2017)  . Type II diabetes mellitus (HCC)     Family History  Problem Relation Age of Onset  . Hypertension Mother   . Hypertension Maternal Grandmother   . Hypertension Paternal Grandmother     Past Surgical History:  Procedure Laterality Date  . AMPUTATION Right 04/13/2017   Procedure: SECOND FOOT RAY AMPUTATION;  Surgeon: Nadara Mustard, MD;  Location: [facility] OR;  Service: Orthopedics;  Laterality: Right;  . NO PAST SURGERIES  Social History   Occupational History  . Not on file  Tobacco Use  . Smoking status: Never Smoker  . Smokeless tobacco: Never Used  Substance and Sexual Activity  . Alcohol use: Yes    Comment: 04/11/2017 "might have a few drinks/year"  . Drug use: No  . Sexual activity: Not on file

## 2017-05-15 ENCOUNTER — Ambulatory Visit (INDEPENDENT_AMBULATORY_CARE_PROVIDER_SITE_OTHER): Payer: Self-pay | Admitting: Orthopedic Surgery

## 2017-05-21 ENCOUNTER — Ambulatory Visit (INDEPENDENT_AMBULATORY_CARE_PROVIDER_SITE_OTHER): Payer: Self-pay | Admitting: Orthopedic Surgery

## 2017-05-21 ENCOUNTER — Encounter (INDEPENDENT_AMBULATORY_CARE_PROVIDER_SITE_OTHER): Payer: Self-pay | Admitting: Orthopedic Surgery

## 2017-05-21 VITALS — Ht 73.0 in | Wt 232.0 lb

## 2017-05-21 DIAGNOSIS — M86171 Other acute osteomyelitis, right ankle and foot: Secondary | ICD-10-CM

## 2017-05-21 DIAGNOSIS — S98131A Complete traumatic amputation of one right lesser toe, initial encounter: Secondary | ICD-10-CM

## 2017-05-21 NOTE — Progress Notes (Signed)
Office Visit Note   Patient: Jerry Arnold           Date of Birth: 02-Jul-1985           MRN: [id number] Visit Date: 05/21/2017              Requested by: No referring provider defined for this encounter. PCP: Patient, No Pcp Per  Chief Complaint  Patient presents with  . Right Foot - Routine Post Op    04/13/17 right foot 2nd ray amputation      HPI: Patient is a 32 year old patient who presents 5 weeks status post right foot second ray amputation.  Patient states that he does have increased swelling he states that he was at a fashion show and up on his feet a lot.  Assessment & Plan: Visit Diagnoses:  1. Amputated toe of right foot (HCC)     Plan: Recommended Achilles stretching this was demonstrated to him recommended a stiff soled Trail running sneaker or walking sneaker.  Recommended avoiding the croc shoe that he is wearing.  Patient may wear regular socks recommended knee-high compression socks to help decrease the swelling.  Follow-Up Instructions: Return if symptoms worsen or fail to improve.   Ortho Exam  Patient is alert, oriented, no adenopathy, well-dressed, normal affect, normal respiratory effort. Examination the incision is well-healed he has dorsiflexion to neutral with his knee extended.  There is no redness no cellulitis no signs of infection he does have some mild venous stasis swelling.  Imaging: No results found. No images are attached to the encounter.  Labs: Lab Results  Component Value Date   HGBA1C 14.0 (H) 04/12/2017   ESRSEDRATE 122 (H) 04/12/2017   REPTSTATUS 04/18/2017 FINAL 04/13/2017   GRAMSTAIN  04/13/2017    FEW WBC PRESENT, PREDOMINANTLY PMN RARE GRAM POSITIVE COCCI IN PAIRS RARE GRAM POSITIVE RODS    CULT  04/13/2017    NO GROWTH 5 DAYS Performed at [facility], [address], [place], [place] [address]    LABORGA STAPHYLOCOCCUS AUREUS 04/13/2017    @LABSALLVALUES (HGBA1)@  Body mass index is 30.61  kg/m.  Orders:  No orders of the defined types were placed in this encounter.  No orders of the defined types were placed in this encounter.    Procedures: No procedures performed  Clinical Data: No additional findings.  ROS:  All other systems negative, except as noted in the HPI. Review of Systems  Objective: Vital Signs: Ht 6\' 1"  (1.854 m)   Wt 232 lb (105.2 kg)   BMI 30.61 kg/m   Specialty Comments:  No specialty comments available.  PMFS History: Patient Active Problem List   Diagnosis Date Noted  . Amputated toe of right foot (HCC) 04/24/2017  . AKI (acute kidney injury) (HCC) 04/16/2017  . Nausea and vomiting 04/16/2017  . Cellulitis of foot   . Acute osteomyelitis of toe, right (HCC) 04/12/2017  . Gangrene of toe of right foot (HCC) 04/12/2017  . Anemia 04/11/2017  . Diabetes (HCC) 04/11/2017   Past Medical History:  Diagnosis Date  . Osteomyelitis (HCC) 04/11/2017   with "second toe on right foot" injury x 2-3 weeks (04/11/2017)  . Type II diabetes mellitus (HCC)     Family History  Problem Relation Age of Onset  . Hypertension Mother   . Hypertension Maternal Grandmother   . Hypertension Paternal Grandmother     Past Surgical History:  Procedure Laterality Date  . AMPUTATION Right 04/13/2017   Procedure: SECOND  FOOT RAY AMPUTATION;  Surgeon: Nadara Mustarduda, Marcus V, MD;  [facility];  Service: Orthopedics;  Laterality: Right;  . NO PAST SURGERIES     Social History   Occupational History  . Not on file  Tobacco Use  . Smoking status: Never Smoker  . Smokeless tobacco: Never Used  Substance and Sexual Activity  . Alcohol use: Yes    Comment: 04/11/2017 "might have a few drinks/year"  . Drug use: No  . Sexual activity: Not on file

## 2017-05-28 ENCOUNTER — Ambulatory Visit: Payer: Self-pay | Attending: Family Medicine | Admitting: Family Medicine

## 2017-05-28 ENCOUNTER — Encounter: Payer: Self-pay | Admitting: Family Medicine

## 2017-05-28 ENCOUNTER — Ambulatory Visit: Payer: Self-pay | Admitting: Family Medicine

## 2017-05-28 VITALS — BP 121/81 | HR 94 | Temp 98.2°F | Ht 73.2 in | Wt 232.4 lb

## 2017-05-28 DIAGNOSIS — Z79899 Other long term (current) drug therapy: Secondary | ICD-10-CM | POA: Insufficient documentation

## 2017-05-28 DIAGNOSIS — E1121 Type 2 diabetes mellitus with diabetic nephropathy: Secondary | ICD-10-CM | POA: Insufficient documentation

## 2017-05-28 DIAGNOSIS — N179 Acute kidney failure, unspecified: Secondary | ICD-10-CM | POA: Insufficient documentation

## 2017-05-28 DIAGNOSIS — I10 Essential (primary) hypertension: Secondary | ICD-10-CM | POA: Insufficient documentation

## 2017-05-28 DIAGNOSIS — K5900 Constipation, unspecified: Secondary | ICD-10-CM | POA: Insufficient documentation

## 2017-05-28 DIAGNOSIS — Z794 Long term (current) use of insulin: Secondary | ICD-10-CM | POA: Insufficient documentation

## 2017-05-28 DIAGNOSIS — K5909 Other constipation: Secondary | ICD-10-CM

## 2017-05-28 LAB — GLUCOSE, POCT (MANUAL RESULT ENTRY): POC Glucose: 152 mg/dl — AB (ref 70–99)

## 2017-05-28 LAB — POCT GLYCOSYLATED HEMOGLOBIN (HGB A1C): Hemoglobin A1C: 10.4

## 2017-05-28 MED ORDER — ISOSORBIDE MONONITRATE ER 30 MG PO TB24: 30.0000 mg | ORAL_TABLET | Freq: Every day | ORAL | 3 refills | Status: DC

## 2017-05-28 MED ORDER — POLYETHYLENE GLYCOL 3350 17 GM/SCOOP PO POWD: 17.0000 g | Freq: Every day | ORAL | 1 refills | Status: DC

## 2017-05-28 MED ORDER — ISOSORBIDE MONONITRATE ER 30 MG PO TB24
30.0000 mg | ORAL_TABLET | Freq: Every day | ORAL | 3 refills | Status: DC
Start: 2017-05-28 — End: 2017-05-28

## 2017-05-28 MED ORDER — POLYETHYLENE GLYCOL 3350 17 GM/SCOOP PO POWD
17.0000 g | Freq: Every day | ORAL | 1 refills | Status: DC
Start: 2017-05-28 — End: 2017-05-28

## 2017-05-28 NOTE — Patient Instructions (Signed)

## 2017-05-28 NOTE — Progress Notes (Signed)
Subjective:  Patient ID: Jerry Arnold, male    DOB: January 30, 1986  Age: 32 y.o. MRN: [id number]  CC: Establish Care; Diabetes; and Constipation   HPI Jerry Arnold is a 32 year old male with history of type 2 diabetes mellitus (A1c 10.4), hypertension, right second toe amputation in 03/2017 who presents today to establish care with me. His A1c is 10.4 and has improved from 14.0 two months ago and he endorses compliance with his NovoLog 70/30 with random blood sugars in the 100-170 range; he denies hypoglycemia or visual concerns. He was placed on Protonix during his hospitalization which he discontinued as it caused abdominal pain and symptoms resolved after discontinuation. His amputation site is healing well and he is followed by Dr. Sharol Given.  He complains of constipation ever since he was placed on amlodipine and moves his bowels about every third to fourth day.  Denies abdominal pain, nausea or vomiting. His renal function reveals an elevated creatinine of 2.26 in review of his chart indicates a baseline of 1.0 two months ago.  Past Medical History:  Diagnosis Date  . Osteomyelitis ([place]) 04/11/2017   with "second toe on right foot" injury x 2-3 weeks (04/11/2017)  . Type II diabetes mellitus ([place])     Past Surgical History:  Procedure Laterality Date  . AMPUTATION Right 04/13/2017   Procedure: SECOND FOOT RAY AMPUTATION;  Surgeon: Newt Minion, MD;  Location: [facility];  Service: Orthopedics;  Laterality: Right;  . NO PAST SURGERIES      Family History  Problem Relation Age of Onset  . Hypertension Mother   . Hypertension Maternal Grandmother   . Hypertension Paternal Grandmother     No Known Allergies   Outpatient Medications Prior to Visit  Medication Sig Dispense Refill  . insulin NPH-regular Human (NOVOLIN 70/30) (70-30) 100 UNIT/ML injection Inject 15 Units into the skin 2 (two) times daily with a meal. 20 mL 3  . metoCLOPramide (REGLAN) 5 MG tablet Take 1 tablet (5 mg  total) by mouth every 6 (six) hours as needed for nausea. 30 tablet 3  . amLODipine (NORVASC) 10 MG tablet Take 1 tablet (10 mg total) by mouth daily. 30 tablet 3  . feeding supplement, ENSURE ENLIVE, (ENSURE ENLIVE) LIQD Take 237 mLs by mouth daily. (Patient not taking: Reported on 05/28/2017) 30 Bottle 0  . protein supplement shake (PREMIER PROTEIN) LIQD Take 59.1 mLs (2 oz total) by mouth daily. (Patient not taking: Reported on 05/28/2017) 30 Can 0  . pantoprazole (PROTONIX) 40 MG tablet Take 1 tablet (40 mg total) by mouth daily at 6 (six) AM. (Patient not taking: Reported on 05/28/2017) 30 tablet 3   No facility-administered medications prior to visit.     ROS Review of Systems General: negative for fever, weight loss, appetite change Eyes: no visual symptoms. ENT: no ear symptoms, no sinus tenderness, no nasal congestion or sore throat. Neck: no pain  Respiratory: no wheezing, shortness of breath, cough Cardiovascular: no chest pain, no dyspnea on exertion, no pedal edema, no orthopnea. Gastrointestinal: no abdominal pain, no diarrhea, + constipation Genito-Urinary: no urinary frequency, no dysuria, no polyuria. Hematologic: no bruising Endocrine: no cold or heat intolerance Neurological: no headaches, no seizures, no tremors Musculoskeletal: no joint pains, no joint swelling Skin: no pruritus, no rash. Psychological: no depression, no anxiety,    Objective:  BP 121/81   Pulse 94   Temp 98.2 F (36.8 C) (Oral)   Ht 6' 1.2" (1.859 m)   Wt 232 lb 6.4  oz (105.4 kg)   SpO2 99%   BMI 30.49 kg/m   BP/Weight 05/28/2017 05/21/2017 1/69/4503  Systolic BP 888 - -  Diastolic BP 81 - -  Wt. (Lbs) 232.4 232 232  BMI 30.49 30.61 30.61      Physical Exam  Constitutional: He is oriented to person, place, and time. He appears well-developed and well-nourished.  Cardiovascular: Normal rate, normal heart sounds and intact distal pulses.  No murmur heard. Pulmonary/Chest: Effort normal and  breath sounds normal. He has no wheezes. He has no rales. He exhibits no tenderness.  Abdominal: Soft. Bowel sounds are normal. He exhibits no distension and no mass. There is no tenderness.  Musculoskeletal: Normal range of motion.  Neurological: He is alert and oriented to person, place, and time.     CMP Latest Ref Rng & Units 04/24/2017 04/20/2017 04/19/2017  Glucose 65 - 99 mg/dL 103(H) 114(H) 113(H)  BUN 6 - 20 mg/dL 23(H) 6 7  Creatinine 0.76 - 1.27 mg/dL 2.26(H) 2.12(H) 2.24(H)  Sodium 134 - 144 mmol/L 142 143 146(H)  Potassium 3.5 - 5.2 mmol/L 4.1 3.1(L) 3.5  Chloride 96 - 106 mmol/L 100 103 108  CO2 20 - 29 mmol/L '24 24 25  ' Calcium 8.7 - 10.2 mg/dL 9.0 8.8(L) 8.8(L)  Total Protein 6.5 - 8.1 g/dL - - -  Total Bilirubin 0.3 - 1.2 mg/dL - - -  Alkaline Phos 38 - 126 U/L - - -  AST 15 - 41 U/L - - -  ALT 17 - 63 U/L - - -    Lab Results  Component Value Date   HGBA1C 10.4 05/28/2017    Assessment & Plan:   1. Type 2 diabetes mellitus with diabetic nephropathy, without long-term current use of insulin (HCC) Uncontrolled with A1c of 10.4 which has improved from 14.0 Continue current regimen, diabetic diet, lifestyle modifications - POCT glucose (manual entry) - POCT glycosylated hemoglobin (Hb A1C)  2. Other constipation Likely secondary to amlodipine which have discontinued Increase fiber intake - polyethylene glycol powder (GLYCOLAX/MIRALAX) powder; Take 17 g by mouth daily.  Dispense: 3350 g; Refill: 1  3. AKI (acute kidney injury) ([place]) Creatinine was 1.0 in 03/2017 and is now 2.26 We will repeat renal function Avoid nephrotoxins - CMP14+EGFR; Future  4. Essential hypertension Would like to place an ace inhibitor however acute kidney injury precludes this If renal function improves I will switch him to ACE inhibitor - isosorbide mononitrate (IMDUR) 30 MG 24 hr tablet; Take 1 tablet (30 mg total) by mouth daily.  Dispense: 30 tablet; Refill: 3   Meds ordered this  encounter  Medications  . polyethylene glycol powder (GLYCOLAX/MIRALAX) powder    Sig: Take 17 g by mouth daily.    Dispense:  3350 g    Refill:  1  . isosorbide mononitrate (IMDUR) 30 MG 24 hr tablet    Sig: Take 1 tablet (30 mg total) by mouth daily.    Dispense:  30 tablet    Refill:  3    Discontinue Amlodipine    Follow-up: Return in about 3 months (around 08/27/2017) for Follow-up on diabetes mellitus.   Charlott Rakes MD

## 2017-05-29 NOTE — Addendum Note (Signed)
Addended by: Paschal DoppWHITE, Korey Arroyo J on: 05/29/2017 04:06 PM   Modules accepted: Orders

## 2017-05-30 LAB — CMP14+EGFR
ALT: 8 IU/L (ref 0–44)
AST: 10 IU/L (ref 0–40)
Albumin/Globulin Ratio: 1.2 (ref 1.2–2.2)
Albumin: 4.3 g/dL (ref 3.5–5.5)
Alkaline Phosphatase: 105 IU/L (ref 39–117)
BUN/Creatinine Ratio: 13 (ref 9–20)
BUN: 19 mg/dL (ref 6–20)
Bilirubin Total: 0.2 mg/dL (ref 0.0–1.2)
CO2: 24 mmol/L (ref 20–29)
Calcium: 9.7 mg/dL (ref 8.7–10.2)
Chloride: 102 mmol/L (ref 96–106)
Creatinine, Ser: 1.49 mg/dL — ABNORMAL HIGH (ref 0.76–1.27)
GFR calc Af Amer: 71 mL/min/{1.73_m2} (ref >59)
GFR calc non Af Amer: 61 mL/min/{1.73_m2} (ref >59)
Globulin, Total: 3.7 g/dL (ref 1.5–4.5)
Glucose: 157 mg/dL — ABNORMAL HIGH (ref 65–99)
Potassium: 4.6 mmol/L (ref 3.5–5.2)
Sodium: 143 mmol/L (ref 134–144)
Total Protein: 8 g/dL (ref 6.0–8.5)

## 2017-06-06 ENCOUNTER — Telehealth: Payer: Self-pay

## 2017-06-06 NOTE — Telephone Encounter (Signed)
Patient was called and informed of lab results. 

## 2017-08-27 ENCOUNTER — Ambulatory Visit: Payer: Self-pay | Attending: Family Medicine | Admitting: Family Medicine

## 2017-08-27 ENCOUNTER — Encounter: Payer: Self-pay | Admitting: Family Medicine

## 2017-08-27 VITALS — BP 151/91 | HR 105 | Temp 98.5°F | Ht 73.2 in | Wt 249.8 lb

## 2017-08-27 DIAGNOSIS — I129 Hypertensive chronic kidney disease with stage 1 through stage 4 chronic kidney disease, or unspecified chronic kidney disease: Secondary | ICD-10-CM | POA: Insufficient documentation

## 2017-08-27 DIAGNOSIS — E1022 Type 1 diabetes mellitus with diabetic chronic kidney disease: Secondary | ICD-10-CM | POA: Insufficient documentation

## 2017-08-27 DIAGNOSIS — L97509 Non-pressure chronic ulcer of other part of unspecified foot with unspecified severity: Secondary | ICD-10-CM

## 2017-08-27 DIAGNOSIS — N182 Chronic kidney disease, stage 2 (mild): Secondary | ICD-10-CM | POA: Insufficient documentation

## 2017-08-27 DIAGNOSIS — Z79899 Other long term (current) drug therapy: Secondary | ICD-10-CM | POA: Insufficient documentation

## 2017-08-27 DIAGNOSIS — E1065 Type 1 diabetes mellitus with hyperglycemia: Secondary | ICD-10-CM | POA: Insufficient documentation

## 2017-08-27 DIAGNOSIS — Z794 Long term (current) use of insulin: Secondary | ICD-10-CM | POA: Insufficient documentation

## 2017-08-27 DIAGNOSIS — I10 Essential (primary) hypertension: Secondary | ICD-10-CM

## 2017-08-27 DIAGNOSIS — E10621 Type 1 diabetes mellitus with foot ulcer: Secondary | ICD-10-CM | POA: Insufficient documentation

## 2017-08-27 LAB — GLUCOSE, POCT (MANUAL RESULT ENTRY): POC Glucose: 166 mg/dl — AB (ref 70–99)

## 2017-08-27 MED ORDER — ISOSORBIDE MONONITRATE ER 60 MG PO TB24: 60.0000 mg | ORAL_TABLET | Freq: Every day | ORAL | 3 refills | Status: DC

## 2017-08-27 MED ORDER — INSULIN NPH ISOPHANE & REGULAR (70-30) 100 UNIT/ML ~~LOC~~ SUSP: 15.0000 [IU] | Freq: Two times a day (BID) | SUBCUTANEOUS | 3 refills | Status: DC

## 2017-08-27 NOTE — Progress Notes (Signed)
Subjective:  Patient ID: Jerry Arnold, male    DOB: Jan 17, 1986  Age: 32 y.o. MRN: [id number]  CC: Diabetes   HPI Jerry Arnold is a 32 year old male with history of type 2 diabetes mellitus (A1c 10.4), hypertension, right second toe amputation in 03/2017, stage II chronic kidney disease who presents today for follow-up visit. He endorses compliance with his insulin and reports his fasting sugars have been in the 116-130 range and he denies hypoglycemia, visual concerns, numbness in extremities. At his last visit amlodipine had been discontinued due to complaints of pedal edema and he was placed on isosorbide and he reports some improvement in pedal edema but notices some edema after prolonged standing.  Denies excessive intake of sodium.  Pedal edema is absent at this time. He has no additional concerns today.  Past Medical History:  Diagnosis Date  . Osteomyelitis ([place]) 04/11/2017   with "second toe on right foot" injury x 2-3 weeks (04/11/2017)  . Type II diabetes mellitus ([place])     Past Surgical History:  Procedure Laterality Date  . AMPUTATION Right 04/13/2017   Procedure: SECOND FOOT RAY AMPUTATION;  Surgeon: Newt Minion, MD;  Location: [facility];  Service: Orthopedics;  Laterality: Right;  . NO PAST SURGERIES      No Known Allergies   Outpatient Medications Prior to Visit  Medication Sig Dispense Refill  . metoCLOPramide (REGLAN) 5 MG tablet Take 1 tablet (5 mg total) by mouth every 6 (six) hours as needed for nausea. 30 tablet 3  . insulin NPH-regular Human (NOVOLIN 70/30) (70-30) 100 UNIT/ML injection Inject 15 Units into the skin 2 (two) times daily with a meal. 20 mL 3  . isosorbide mononitrate (IMDUR) 30 MG 24 hr tablet Take 1 tablet (30 mg total) by mouth daily. 30 tablet 3  . feeding supplement, ENSURE ENLIVE, (ENSURE ENLIVE) LIQD Take 237 mLs by mouth daily. (Patient not taking: Reported on 05/28/2017) 30 Bottle 0  . polyethylene glycol powder (GLYCOLAX/MIRALAX) powder  Take 17 g by mouth daily. (Patient not taking: Reported on 08/27/2017) 3350 g 1  . protein supplement shake (PREMIER PROTEIN) LIQD Take 59.1 mLs (2 oz total) by mouth daily. (Patient not taking: Reported on 05/28/2017) 30 Can 0   No facility-administered medications prior to visit.     ROS Review of Systems  Constitutional: Negative for activity change and appetite change.  HENT: Negative for sinus pressure and sore throat.   Eyes: Negative for visual disturbance.  Respiratory: Negative for cough, chest tightness and shortness of breath.   Cardiovascular: Negative for chest pain and leg swelling.  Gastrointestinal: Negative for abdominal distention, abdominal pain, constipation and diarrhea.  Endocrine: Negative.   Genitourinary: Negative for dysuria.  Musculoskeletal: Negative for joint swelling and myalgias.  Skin: Negative for rash.  Allergic/Immunologic: Negative.   Neurological: Negative for weakness, light-headedness and numbness.  Psychiatric/Behavioral: Negative for dysphoric mood and suicidal ideas.    Objective:  BP (!) 151/91   Pulse (!) 105   Temp 98.5 F (36.9 C) (Oral)   Ht 6' 1.2" (1.859 m)   Wt 249 lb 12.8 oz (113.3 kg)   SpO2 100%   BMI 32.78 kg/m   BP/Weight 08/27/2017 08/20/945 0/10/6281  Systolic BP 662 947 -  Diastolic BP 91 81 -  Wt. (Lbs) 249.8 232.4 232  BMI 32.78 30.49 30.61      Physical Exam  Constitutional: He is oriented to person, place, and time. He appears well-developed and well-nourished.  Cardiovascular: Normal heart  sounds and intact distal pulses. Tachycardia present.  No murmur heard. Pulmonary/Chest: Effort normal and breath sounds normal. He has no wheezes. He has no rales. He exhibits no tenderness.  Abdominal: Soft. Bowel sounds are normal. He exhibits no distension and no mass. There is no tenderness.  Musculoskeletal: Normal range of motion.  Neurological: He is alert and oriented to person, place, and time.  Skin: Skin is warm  and dry.  Psychiatric: He has a normal mood and affect.    CMP Latest Ref Rng & Units 05/29/2017 04/24/2017 04/20/2017  Glucose 65 - 99 mg/dL 157(H) 103(H) 114(H)  BUN 6 - 20 mg/dL 19 23(H) 6  Creatinine 0.76 - 1.27 mg/dL 1.49(H) 2.26(H) 2.12(H)  Sodium 134 - 144 mmol/L 143 142 143  Potassium 3.5 - 5.2 mmol/L 4.6 4.1 3.1(L)  Chloride 96 - 106 mmol/L 102 100 103  CO2 20 - 29 mmol/L '24 24 24  ' Calcium 8.7 - 10.2 mg/dL 9.7 9.0 8.8(L)  Total Protein 6.0 - 8.5 g/dL 8.0 - -  Total Bilirubin 0.0 - 1.2 mg/dL 0.2 - -  Alkaline Phos 39 - 117 IU/L 105 - -  AST 0 - 40 IU/L 10 - -  ALT 0 - 44 IU/L 8 - -    Lipid Panel     Component Value Date/Time   CHOL 154 04/12/2017 0036   TRIG 88 04/12/2017 0036   HDL 41 04/12/2017 0036   CHOLHDL 3.8 04/12/2017 0036   VLDL 18 04/12/2017 0036   LDLCALC 95 04/12/2017 0036    Lab Results  Component Value Date   HGBA1C 10.4 05/28/2017    Assessment & Plan:   1. 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 It is recommended that you undergo annual eye exams and annual foot exams. Pneumonia vaccine is recommended. - POCT glucose (manual entry) - Hemoglobin A1c - CMP14+EGFR - insulin NPH-regular Human (NOVOLIN 70/30) (70-30) 100 UNIT/ML injection; Inject 15 Units into the skin 2 (two) times daily with a meal.  Dispense: 20 mL; Refill: 3 - Microalbumin/Creatinine Ratio, Urine  2. Essential hypertension Uncontrolled Increased dose of isosorbide Counseled on blood pressure goal of less than 130/80, low-sodium, DASH diet, medication compliance, 150 minutes of moderate intensity exercise per  week. Discussed medication compliance, adverse effects. - isosorbide mononitrate (IMDUR) 60 MG 24 hr tablet; Take 1 tablet (60 mg total) by mouth daily.  Dispense: 30 tablet; Refill: 3   Meds ordered this encounter  Medications  . insulin NPH-regular Human (NOVOLIN 70/30) (70-30) 100 UNIT/ML injection    Sig: Inject 15 Units into the skin 2 (two) times daily with a meal.    Dispense:  20 mL    Refill:  3  . isosorbide mononitrate (IMDUR) 60 MG 24 hr tablet    Sig: Take 1 tablet (60 mg total) by mouth daily.    Dispense:  30 tablet    Refill:  3    Discontinue previous dose    Follow-up: Return in about 3 months (around 11/27/2017) for Follow-up of chronic medical conditions.   Charlott Rakes MD

## 2017-08-28 ENCOUNTER — Other Ambulatory Visit: Payer: Self-pay | Admitting: Family Medicine

## 2017-08-28 DIAGNOSIS — L97509 Non-pressure chronic ulcer of other part of unspecified foot with unspecified severity: Principal | ICD-10-CM

## 2017-08-28 DIAGNOSIS — E10621 Type 1 diabetes mellitus with foot ulcer: Secondary | ICD-10-CM

## 2017-08-28 LAB — CMP14+EGFR
ALT: 14 IU/L (ref 0–44)
AST: 13 IU/L (ref 0–40)
Albumin/Globulin Ratio: 1.3 (ref 1.2–2.2)
Albumin: 4.3 g/dL (ref 3.5–5.5)
Alkaline Phosphatase: 93 IU/L (ref 39–117)
BUN/Creatinine Ratio: 14 (ref 9–20)
BUN: 19 mg/dL (ref 6–20)
Bilirubin Total: <0.2 mg/dL (ref 0.0–1.2)
CO2: 24 mmol/L (ref 20–29)
Calcium: 9.2 mg/dL (ref 8.7–10.2)
Chloride: 103 mmol/L (ref 96–106)
Creatinine, Ser: 1.4 mg/dL — ABNORMAL HIGH (ref 0.76–1.27)
GFR calc Af Amer: 76 mL/min/{1.73_m2} (ref >59)
GFR calc non Af Amer: 66 mL/min/{1.73_m2} (ref >59)
Globulin, Total: 3.2 g/dL (ref 1.5–4.5)
Glucose: 118 mg/dL — ABNORMAL HIGH (ref 65–99)
Potassium: 4.6 mmol/L (ref 3.5–5.2)
Sodium: 142 mmol/L (ref 134–144)
Total Protein: 7.5 g/dL (ref 6.0–8.5)

## 2017-08-28 LAB — HEMOGLOBIN A1C
Est. average glucose Bld gHb Est-mCnc: 246 mg/dL
Hgb A1c MFr Bld: 10.2 % — ABNORMAL HIGH (ref 4.8–5.6)

## 2017-08-28 MED ORDER — INSULIN NPH ISOPHANE & REGULAR (70-30) 100 UNIT/ML ~~LOC~~ SUSP: 20.0000 [IU] | Freq: Two times a day (BID) | SUBCUTANEOUS | 3 refills | Status: DC

## 2017-08-29 ENCOUNTER — Telehealth: Payer: Self-pay | Admitting: Family Medicine

## 2017-08-29 NOTE — Telephone Encounter (Signed)
Pt called to request results, he verified DOB and nurse provided his last lab results

## 2017-11-04 ENCOUNTER — Other Ambulatory Visit: Payer: Self-pay

## 2017-11-04 ENCOUNTER — Encounter (HOSPITAL_COMMUNITY): Payer: Self-pay | Admitting: Emergency Medicine

## 2017-11-04 ENCOUNTER — Emergency Department (HOSPITAL_COMMUNITY)
Admission: EM | Admit: 2017-11-04 | Discharge: 2017-11-04 | Disposition: A | Discharge status: Home / Self Care | Payer: Self-pay | Attending: Emergency Medicine | Admitting: Emergency Medicine

## 2017-11-04 DIAGNOSIS — Z79899 Other long term (current) drug therapy: Secondary | ICD-10-CM | POA: Insufficient documentation

## 2017-11-04 DIAGNOSIS — J029 Acute pharyngitis, unspecified: Secondary | ICD-10-CM | POA: Insufficient documentation

## 2017-11-04 DIAGNOSIS — E119 Type 2 diabetes mellitus without complications: Secondary | ICD-10-CM | POA: Insufficient documentation

## 2017-11-04 DIAGNOSIS — Z794 Long term (current) use of insulin: Secondary | ICD-10-CM | POA: Insufficient documentation

## 2017-11-04 DIAGNOSIS — I10 Essential (primary) hypertension: Secondary | ICD-10-CM | POA: Insufficient documentation

## 2017-11-04 LAB — GROUP A STREP BY PCR: Group A Strep by PCR: NOT DETECTED

## 2017-11-04 MED ORDER — KETOROLAC TROMETHAMINE 60 MG/2ML IM SOLN
30.0000 mg | Freq: Once | INTRAMUSCULAR | Status: AC
  Administered 2017-11-04: 30 mg via INTRAMUSCULAR
  Filled 2017-11-04: qty 2

## 2017-11-04 MED ORDER — KETOROLAC TROMETHAMINE 60 MG/2ML IM SOLN: 60.0000 mg | Freq: Once | INTRAMUSCULAR | Status: DC

## 2017-11-04 MED ORDER — DEXAMETHASONE 4 MG PO TABS
10.0000 mg | ORAL_TABLET | Freq: Once | ORAL | Status: AC
  Administered 2017-11-04: 10 mg via ORAL
  Filled 2017-11-04: qty 2

## 2017-11-04 MED ORDER — CLINDAMYCIN HCL 300 MG PO CAPS: 300.0000 mg | ORAL_CAPSULE | Freq: Four times a day (QID) | ORAL | 0 refills | Status: DC

## 2017-11-04 NOTE — ED Triage Notes (Signed)
Pt reports sore throat and sinus congestion for the last several days.

## 2017-11-04 NOTE — ED Provider Notes (Signed)
[facility]-EMERGENCY DEPT Provider Note   CSN: [id number] Arrival date & time: 11/04/17  1949     History   Chief Complaint Chief Complaint  Patient presents with  . Sore Throat    HPI Jerry Arnold is a 32 y.o. male.  The history is provided by the patient.  Sore Throat  This is a new problem. The current episode started 2 days ago. The problem occurs constantly. The problem has not changed since onset.Pertinent negatives include no chest pain, no abdominal pain, no headaches and no shortness of breath. The symptoms are aggravated by eating. The symptoms are relieved by acetaminophen and NSAIDs. He has tried acetaminophen for the symptoms. The treatment provided mild relief.    Past Medical History:  Diagnosis Date  . Osteomyelitis ([facility]) 04/11/2017   with "second toe on right foot" injury x 2-3 weeks (04/11/2017)  . Type II diabetes mellitus [facility])     Patient Active Problem List   Diagnosis Date Noted  . Hypertension 05/28/2017  . Amputated toe of right foot ([facility]) 04/24/2017  . AKI (acute kidney injury) ([facility]) 04/16/2017  . Nausea and vomiting 04/16/2017  . Cellulitis of foot   . Acute osteomyelitis of toe, right ([facility]) 04/12/2017  . Gangrene of toe of right foot ([facility]) 04/12/2017  . Anemia 04/11/2017  . Diabetes ([facility]) 04/11/2017    Past Surgical History:  Procedure Laterality Date  . AMPUTATION Right 04/13/2017   Procedure: SECOND FOOT RAY AMPUTATION;  Surgeon: Nadara Mustard, MD;  Location: [facility] OR;  Service: Orthopedics;  Laterality: Right;  . NO PAST SURGERIES          Home Medications    Prior to Admission medications   Medication Sig Start Date End Date Taking? Authorizing Provider  clindamycin (CLEOCIN) 300 MG capsule Take 1 capsule (300 mg total) by mouth 4 (four) times daily for 10 days. 11/04/17 11/14/17  Forney Kleinpeter, DO  feeding supplement, ENSURE ENLIVE, (ENSURE ENLIVE) LIQD Take 237 mLs by mouth daily. Patient not taking:  Reported on 05/28/2017 04/20/17   Maretta Bees, MD  insulin NPH-regular Human (NOVOLIN 70/30) (70-30) 100 UNIT/ML injection Inject 20 Units into the skin 2 (two) times daily with a meal. 08/28/17   Hoy Register, MD  isosorbide mononitrate (IMDUR) 60 MG 24 hr tablet Take 1 tablet (60 mg total) by mouth daily. 08/27/17   Hoy Register, MD  metoCLOPramide (REGLAN) 5 MG tablet Take 1 tablet (5 mg total) by mouth every 6 (six) hours as needed for nausea. 04/24/17   Vivianne Master, PA-C  polyethylene glycol powder (GLYCOLAX/MIRALAX) powder Take 17 g by mouth daily. Patient not taking: Reported on 08/27/2017 05/28/17   Hoy Register, MD  protein supplement shake (PREMIER PROTEIN) LIQD Take 59.1 mLs (2 oz total) by mouth daily. Patient not taking: Reported on 05/28/2017 04/21/17   Maretta Bees, MD    Family History Family History  Problem Relation Age of Onset  . Hypertension Mother   . Hypertension Maternal Grandmother   . Hypertension Paternal Grandmother     Social History Social History   Tobacco Use  . Smoking status: Never Smoker  . Smokeless tobacco: Never Used  Substance Use Topics  . Alcohol use: Yes    Comment: 04/11/2017 "might have a few drinks/year"  . Drug use: No     Allergies   Patient has no known allergies.   Review of Systems Review of Systems  Constitutional: Negative for chills and fever.  HENT:  Positive for postnasal drip and rhinorrhea. Negative for congestion, dental problem, drooling, ear discharge, ear pain, facial swelling, sinus pressure, sore throat, trouble swallowing and voice change.   Eyes: Negative for pain and visual disturbance.  Respiratory: Negative for cough and shortness of breath.   Cardiovascular: Negative for chest pain and palpitations.  Gastrointestinal: Negative for abdominal pain and vomiting.  Genitourinary: Negative for dysuria and hematuria.  Musculoskeletal: Negative for arthralgias and back pain.  Skin: Negative for color  change and rash.  Neurological: Negative for seizures, syncope and headaches.  All other systems reviewed and are negative.    Physical Exam Updated Vital Signs  ED Triage Vitals  Enc Vitals Group     BP 11/04/17 2007 104/79     Pulse Rate 11/04/17 2007 (!) 114     Resp 11/04/17 2007 18     Temp 11/04/17 2007 99.9 F (37.7 C)     Temp Source 11/04/17 2007 Oral     SpO2 11/04/17 2007 96 %     Weight 11/04/17 2012 245 lb (111.1 kg)     Height 11/04/17 2012 6\' 1"  (1.854 m)     Head Circumference --      Peak Flow --      Pain Score 11/04/17 2012 7     Pain Loc --      Pain Edu? --      Excl. in GC? --     Physical Exam  Constitutional: He appears well-developed and well-nourished.  HENT:  Head: Normocephalic and atraumatic.  Right Ear: Tympanic membrane normal. No tenderness.  Left Ear: Tympanic membrane normal. No tenderness.  Mouth/Throat: Uvula is midline and mucous membranes are normal. No oral lesions. No uvula swelling. Oropharyngeal exudate and posterior oropharyngeal erythema present. No posterior oropharyngeal edema or tonsillar abscesses. Tonsils are 2+ on the right. Tonsils are 2+ on the left. Tonsillar exudate.  Eyes: Pupils are equal, round, and reactive to light. Conjunctivae and EOM are normal.  Neck: Neck supple.  Cardiovascular: Normal rate, regular rhythm, normal heart sounds and intact distal pulses.  No murmur heard. Pulmonary/Chest: Effort normal and breath sounds normal. No respiratory distress.  Abdominal: Soft. Bowel sounds are normal. There is no tenderness.  Musculoskeletal: He exhibits no edema.  Neurological: He is alert.  Skin: Skin is warm and dry. Capillary refill takes less than 2 seconds. No rash noted.  Psychiatric: He has a normal mood and affect.  Nursing note and vitals reviewed.    ED Treatments / Results  Labs (all labs ordered are listed, but only abnormal results are displayed) Labs Reviewed  GROUP A STREP BY PCR     EKG None  Radiology No results found.  Procedures Procedures (including critical care time)  Medications Ordered in ED Medications  dexamethasone (DECADRON) tablet 10 mg (10 mg Oral Given 11/04/17 2237)  ketorolac (TORADOL) injection 30 mg (30 mg Intramuscular Given 11/04/17 2236)     Initial Impression / Assessment and Plan / ED Course  I have reviewed the triage vital signs and the nursing notes.  Pertinent labs & imaging results that were available during my care of the patient were reviewed by me and considered in my medical decision making (see chart for details).     Terrelle Ruffolo is a 32 year old male history of diabetes who presents to the ED with sore throat.  Patient with unremarkable vitals.  No fever.  Patient with symptoms for the last several days.  Has pain with swallowing but denies  any trismus, drooling, decreased range of motion of his neck.  Patient has bilateral tonsillar exudates on exam.  No obvious cervical adenopathy.  No signs to suggest peritonsillar abscess.  Patient with negative strep testing.  Patient given Decadron, IM Toradol.  Has already taken Tylenol.  Patient given prescription for clindamycin as concern for possible atypical infection, however, suspect viral process.  Given strict return precautions.  No suspicion for peritonsillar or retropharyngeal abscess at this time but made aware of things to look for and reasons to return to the ED.  Recommend follow-up with primary care doctor.  Patient does have history of CKD and told him to refrain from any further Motrin or ibuprofen use at this time.  Discharged from ED in good condition. Tachycardia improved following pain medication.   Final Clinical Impressions(s) / ED Diagnoses   Final diagnoses:  Pharyngitis, unspecified etiology    ED Discharge Orders         Ordered    clindamycin (CLEOCIN) 300 MG capsule  4 times daily     11/04/17 2306           Virgina NorfolkCuratolo, Giannina Bartolome, DO 11/05/17  0034

## 2017-11-13 ENCOUNTER — Ambulatory Visit: Payer: Self-pay | Attending: Family Medicine | Admitting: Physician Assistant

## 2017-11-13 VITALS — BP 142/86 | HR 108 | Temp 98.9°F | Resp 16 | Ht 73.0 in | Wt 252.0 lb

## 2017-11-13 DIAGNOSIS — L03031 Cellulitis of right toe: Secondary | ICD-10-CM | POA: Insufficient documentation

## 2017-11-13 DIAGNOSIS — L97509 Non-pressure chronic ulcer of other part of unspecified foot with unspecified severity: Secondary | ICD-10-CM

## 2017-11-13 DIAGNOSIS — Z79899 Other long term (current) drug therapy: Secondary | ICD-10-CM | POA: Insufficient documentation

## 2017-11-13 DIAGNOSIS — L600 Ingrowing nail: Secondary | ICD-10-CM | POA: Insufficient documentation

## 2017-11-13 DIAGNOSIS — B9689 Other specified bacterial agents as the cause of diseases classified elsewhere: Secondary | ICD-10-CM | POA: Insufficient documentation

## 2017-11-13 DIAGNOSIS — E10621 Type 1 diabetes mellitus with foot ulcer: Secondary | ICD-10-CM | POA: Insufficient documentation

## 2017-11-13 DIAGNOSIS — Z794 Long term (current) use of insulin: Secondary | ICD-10-CM | POA: Insufficient documentation

## 2017-11-13 LAB — GLUCOSE, POCT (MANUAL RESULT ENTRY): POC Glucose: 171 mg/dl — AB (ref 70–99)

## 2017-11-13 MED ORDER — DOXYCYCLINE HYCLATE 100 MG PO TABS: 100.0000 mg | ORAL_TABLET | Freq: Two times a day (BID) | ORAL | 0 refills | Status: DC

## 2017-11-13 MED ORDER — MUPIROCIN 2 % EX OINT: 1.0000 "application " | TOPICAL_OINTMENT | Freq: Two times a day (BID) | CUTANEOUS | 0 refills | Status: DC

## 2017-11-13 MED FILL — MUPIROCIN 2% OINTMENT: 2 | 10 days supply | Qty: 22 | Fill #0

## 2017-11-13 MED FILL — DOXYCYCLINE HYCLATE 100 MG: 100 | 10 days supply | Qty: 20 | Fill #0

## 2017-11-13 NOTE — Progress Notes (Signed)
Patient ID: Jerry Arnold, male   DOB: [birth year], 33 y.o.   MRN: [id number]        Jerry Arnold, is a 32 y.o. male  QMV:[id number]  MWU:[id number]  DOB - [birth year]  Subjective:  Chief Complaint and HPI: Jerry Arnold is a 32 y.o. male here today for R great toe erythema and pus coming out of it.  He has been using antibiotic ointment on it.  Has appt with pcp next week.  Some pain.  2nd toe amputated due to diabetes.    ROS:   Constitutional:  No f/c, No night sweats, No unexplained weight loss. EENT:  No vision changes, No blurry vision, No hearing changes. No mouth, throat, or ear problems.  Respiratory: No cough, No SOB Cardiac: No CP, no palpitations GI:  No abd pain, No N/V/D. GU: No Urinary s/sx Musculoskeletal: No joint pain Neuro: No headache, no dizziness, no motor weakness.  Skin: No rash Endocrine:  No polydipsia. No polyuria.  Psych: Denies SI/HI  No problems updated.  ALLERGIES: No Known Allergies  PAST MEDICAL HISTORY: Past Medical History:  Diagnosis Date  . Osteomyelitis ([facility]) 04/11/2017   with "second toe on right foot" injury x 2-3 weeks (04/11/2017)  . Type II diabetes mellitus ([facility])     MEDICATIONS AT HOME: Prior to Admission medications   Medication Sig Start Date End Date Taking? Authorizing Provider  insulin NPH-regular Human (NOVOLIN 70/30) (70-30) 100 UNIT/ML injection Inject 20 Units into the skin 2 (two) times daily with a meal. 08/28/17  Yes Newlin, Enobong, MD  isosorbide mononitrate (IMDUR) 60 MG 24 hr tablet Take 1 tablet (60 mg total) by mouth daily. 08/27/17  Yes Hoy Register, MD  metoCLOPramide (REGLAN) 5 MG tablet Take 1 tablet (5 mg total) by mouth every 6 (six) hours as needed for nausea. 04/24/17  Yes Noel, Tiffany S, PA-C  Multiple Vitamins-Minerals (MULTI-BETIC DIABETES) TABS Take 1 tablet by mouth daily.   Yes [provider]  doxycycline (VIBRA-TABS) 100 MG tablet Take 1 tablet (100 mg total) by mouth 2 (two) times  daily. 11/13/17   Anders Simmonds, PA-C  mupirocin ointment (BACTROBAN) 2 % Place 1 application into the nose 2 (two) times daily. 11/13/17   Anders Simmonds, PA-C     Objective:  EXAM:   Vitals:   11/13/17 1541  BP: (!) 142/86  Pulse: (!) 108  Resp: 16  Temp: 98.9 F (37.2 C)  TempSrc: Oral  SpO2: 100%  Weight: 252 lb (114.3 kg)  Height: 6\' 1"  (1.854 m)    General appearance : A&OX3. NAD. Non-toxic-appearing HEENT: Atraumatic and Normocephalic.  PERRLA. EOM intact.  Chest/Lungs:  Breathing-non-labored, Good air entry bilaterally, breath sounds normal without rales, rhonchi, or wheezing  CVS: S1 S2 regular, no murmurs, gallops, rubs  Extremities: Bilateral Lower Ext shows no edema, both legs are warm to touch with = pulse throughout.  2nd digit R foot missing.  Lateral aspect of R great toenail with ingrown nail/paronychia with purulent drainage.  Mild erythema.  N-V intact.  Good capillary RF. Neurology:  CN II-XII grossly intact, Non focal.   Psych:  TP linear. J/I WNL. Normal speech. Appropriate eye contact and affect.  Skin:  No Rash  Data Review Lab Results  Component Value Date   HGBA1C 10.2 (H) 08/27/2017   HGBA1C 10.4 05/28/2017   HGBA1C 14.0 (H) 04/12/2017     Assessment & Plan   1. Cellulitis of toe of right foot - doxycycline (VIBRA-TABS) 100 MG  tablet; Take 1 tablet (100 mg total) by mouth 2 (two) times daily.  Dispense: 20 tablet; Refill: 0 - mupirocin ointment (BACTROBAN) 2 %; Place 1 application into the nose 2 (two) times daily.  Dispense: 22 g; Refill: 0 - Ambulatory referral to Podiatry(in case not improving and nail needs partial/total removal)  2. Ingrown nail of great toe of right foot - doxycycline (VIBRA-TABS) 100 MG tablet; Take 1 tablet (100 mg total) by mouth 2 (two) times daily.  Dispense: 20 tablet; Refill: 0 - mupirocin ointment (BACTROBAN) 2 %; Place 1 application into the nose 2 (two) times daily.  Dispense: 22 g; Refill: 0 -  Ambulatory referral to Podiatry(in case not improving and nail needs partial/total removal)   3. Type 1 diabetes mellitus with foot ulcer ([facility]) Not at goal-has f/up with PCP next week.  Continue current regimen and watch diet.  - Glucose (CBG)  Patient have been counseled extensively about nutrition and exercise  Return for keep appt with Dr Alvis Lemmings.  The patient was given clear instructions to go to ER or return to medical center if symptoms don't improve, worsen or new problems develop. The patient verbalized understanding. The patient was told to call to get lab results if they haven't heard anything in the next week.     Georgian Co, PA-C [facility] [place], [place] [phone]   11/13/2017, 3:57 PM

## 2017-11-13 NOTE — Patient Instructions (Signed)
Ingrown Toenail An ingrown toenail occurs when the corner or sides of your toenail grow into the surrounding skin. The big toe is most commonly affected, but it can happen to any of your toes. If your ingrown toenail is not treated, you will be at risk for infection. What are the causes? This condition may be caused by:  Wearing shoes that are too small or tight.  Injury or trauma, such as stubbing your toe or having your toe stepped on.  Improper cutting or care of your toenails.  Being born with (congenital) nail or foot abnormalities, such as having a nail that is too big for your toe.  What increases the risk? Risk factors for an ingrown toenail include:  Age. Your nails tend to thicken as you get older, so ingrown nails are more common in older people.  Diabetes.  Cutting your toenails incorrectly.  Blood circulation problems.  What are the signs or symptoms? Symptoms may include:  Pain, soreness, or tenderness.  Redness.  Swelling.  Hardening of the skin surrounding the toe.  Your ingrown toenail may be infected if there is fluid, pus, or drainage. How is this diagnosed? An ingrown toenail may be diagnosed by medical history and physical exam. If your toenail is infected, your health care provider may test a sample of the drainage. How is this treated? Treatment depends on the severity of your ingrown toenail. Some ingrown toenails may be treated at home. More severe or infected ingrown toenails may require surgery to remove all or part of the nail. Infected ingrown toenails may also be treated with antibiotic medicines. Follow these instructions at home:  If you were prescribed an antibiotic medicine, finish all of it even if you start to feel better.  Soak your foot in warm soapy water for 20 minutes, 3 times per day or as directed by your health care provider.  Carefully lift the edge of the nail away from the sore skin by wedging a small piece of cotton under  the corner of the nail. This may help with the pain. Be careful not to cause more injury to the area.  Wear shoes that fit well. If your ingrown toenail is causing you pain, try wearing sandals, if possible.  Trim your toenails regularly and carefully. Do not cut them in a curved shape. Cut your toenails straight across. This prevents injury to the skin at the corners of the toenail.  Keep your feet clean and dry.  If you are having trouble walking and are given crutches by your health care provider, use them as directed.  Do not pick at your toenail or try to remove it yourself.  Take medicines only as directed by your health care provider.  Keep all follow-up visits as directed by your health care provider. This is important. Contact a health care provider if:  Your symptoms do not improve with treatment. Get help right away if:  You have red streaks that start at your foot and go up your leg.  You have a fever.  You have increased redness, swelling, or pain.  You have fluid, blood, or pus coming from your toenail. This information is not intended to replace advice given to you by your health care provider. Make sure you discuss any questions you have with your health care provider. Document Released: 02/03/2000 Document Revised: 07/08/2015 Document Reviewed: 12/30/2013 Elsevier Interactive Patient Education  2018 [employer]  

## 2017-11-27 ENCOUNTER — Encounter: Payer: Self-pay | Admitting: Family Medicine

## 2017-11-27 ENCOUNTER — Ambulatory Visit: Payer: Self-pay | Attending: Family Medicine | Admitting: Family Medicine

## 2017-11-27 VITALS — BP 169/95 | HR 92 | Temp 98.3°F | Ht 73.0 in | Wt 256.4 lb

## 2017-11-27 DIAGNOSIS — L600 Ingrowing nail: Secondary | ICD-10-CM

## 2017-11-27 DIAGNOSIS — E11621 Type 2 diabetes mellitus with foot ulcer: Secondary | ICD-10-CM

## 2017-11-27 DIAGNOSIS — Z79899 Other long term (current) drug therapy: Secondary | ICD-10-CM | POA: Insufficient documentation

## 2017-11-27 DIAGNOSIS — L03031 Cellulitis of right toe: Secondary | ICD-10-CM

## 2017-11-27 DIAGNOSIS — E1122 Type 2 diabetes mellitus with diabetic chronic kidney disease: Secondary | ICD-10-CM | POA: Insufficient documentation

## 2017-11-27 DIAGNOSIS — N182 Chronic kidney disease, stage 2 (mild): Secondary | ICD-10-CM | POA: Insufficient documentation

## 2017-11-27 DIAGNOSIS — Z794 Long term (current) use of insulin: Secondary | ICD-10-CM | POA: Insufficient documentation

## 2017-11-27 DIAGNOSIS — I10 Essential (primary) hypertension: Secondary | ICD-10-CM

## 2017-11-27 DIAGNOSIS — I129 Hypertensive chronic kidney disease with stage 1 through stage 4 chronic kidney disease, or unspecified chronic kidney disease: Secondary | ICD-10-CM | POA: Insufficient documentation

## 2017-11-27 DIAGNOSIS — L97509 Non-pressure chronic ulcer of other part of unspecified foot with unspecified severity: Secondary | ICD-10-CM | POA: Insufficient documentation

## 2017-11-27 DIAGNOSIS — Z89431 Acquired absence of right foot: Secondary | ICD-10-CM | POA: Insufficient documentation

## 2017-11-27 LAB — POCT GLYCOSYLATED HEMOGLOBIN (HGB A1C): Hemoglobin A1C: 9.4 % — AB (ref 4.0–5.6)

## 2017-11-27 LAB — GLUCOSE, POCT (MANUAL RESULT ENTRY): POC Glucose: 140 mg/dl — AB (ref 70–99)

## 2017-11-27 MED ORDER — INSULIN NPH ISOPHANE & REGULAR (70-30) 100 UNIT/ML ~~LOC~~ SUSP: 23.0000 [IU] | Freq: Two times a day (BID) | SUBCUTANEOUS | 3 refills | Status: DC

## 2017-11-27 MED ORDER — CEPHALEXIN 500 MG PO CAPS: 500.0000 mg | ORAL_CAPSULE | Freq: Two times a day (BID) | ORAL | 0 refills | Status: DC

## 2017-11-27 MED ORDER — LISINOPRIL 5 MG PO TABS: 5.0000 mg | ORAL_TABLET | Freq: Every day | ORAL | 3 refills | Status: DC

## 2017-11-27 MED FILL — LISINOPRIL 5 MG TAB: 5 | 30 days supply | Qty: 30 | Fill #0

## 2017-11-27 MED FILL — !NOVOLIN 70/30 100 UNITS/ML: (70-30) 100 | 21 days supply | Qty: 10 | Fill #0

## 2017-11-27 MED FILL — CEPHALEXIN 500 MG CAPSULE: 500 | 10 days supply | Qty: 20 | Fill #0

## 2017-11-27 NOTE — Progress Notes (Signed)
Subjective:  Patient ID: Jerry Arnold, male    DOB: 1985/04/04  Age: 32 y.o. MRN: [id number]  CC: Diabetes   HPI Jerry Arnold  is a 32 year old male with history of type 2 diabetes mellitus (A1c 9.4), hypertension, right second toe amputation in 03/2017, stage II chronic kidney disease who presents today for follow-up visit. His A1c is 9.4 which has decreased slightly from 10.4 previously but he informs me he has not had post prandial sugars above 200 and endorses compliance with his medications. Denies hypoglycemia, presence of neuropathy or visual complains. Diagnosed with Diabetes at 32 years of age and informed he had Type 2 DM,was initially on metformin. His chart reveals both type 1 and Type 2 DM  He has right big toe ingrown toe nail and was placed on Doxycycline by the PA 2 weeks ago. Report foul odor and drainage have ceased but his toe is still swollen and slightly tender.Referred to podiatry but it appears referral was closed. He has no fever or difficulty bearing weight.  Past Medical History:  Diagnosis Date  . Osteomyelitis (HCC) 04/11/2017   with "second toe on right foot" injury x 2-3 weeks (04/11/2017)  . Type II diabetes mellitus (HCC)     Past Surgical History:  Procedure Laterality Date  . AMPUTATION Right 04/13/2017   Procedure: SECOND FOOT RAY AMPUTATION;  Surgeon: Nadara Mustard, MD;  Location: [facility] OR;  Service: Orthopedics;  Laterality: Right;  . NO PAST SURGERIES      No Known Allergies   Outpatient Medications Prior to Visit  Medication Sig Dispense Refill  . isosorbide mononitrate (IMDUR) 60 MG 24 hr tablet Take 1 tablet (60 mg total) by mouth daily. 30 tablet 3  . Multiple Vitamins-Minerals (MULTI-BETIC DIABETES) TABS Take 1 tablet by mouth daily.    . mupirocin ointment (BACTROBAN) 2 % Place 1 application into the nose 2 (two) times daily. 22 g 0  . insulin NPH-regular Human (NOVOLIN 70/30) (70-30) 100 UNIT/ML injection Inject 20 Units into the skin  2 (two) times daily with a meal. 30 mL 3  . metoCLOPramide (REGLAN) 5 MG tablet Take 1 tablet (5 mg total) by mouth every 6 (six) hours as needed for nausea. (Patient not taking: Reported on 11/27/2017) 30 tablet 3  . doxycycline (VIBRA-TABS) 100 MG tablet Take 1 tablet (100 mg total) by mouth 2 (two) times daily. (Patient not taking: Reported on 11/27/2017) 20 tablet 0   No facility-administered medications prior to visit.     ROS Review of Systems  Constitutional: Negative for activity change and appetite change.  HENT: Negative for sinus pressure and sore throat.   Eyes: Negative for visual disturbance.  Respiratory: Negative for cough, chest tightness and shortness of breath.   Cardiovascular: Negative for chest pain and leg swelling.  Gastrointestinal: Negative for abdominal distention, abdominal pain, constipation and diarrhea.  Endocrine: Negative.   Genitourinary: Negative for dysuria.  Musculoskeletal:       See hpi  Skin: Negative for rash.  Allergic/Immunologic: Negative.   Neurological: Negative for weakness, light-headedness and numbness.  Psychiatric/Behavioral: Negative for dysphoric mood and suicidal ideas.    Objective:  BP (!) 169/95   Pulse 92   Temp 98.3 F (36.8 C) (Oral)   Ht 6\' 1"  (1.854 m)   Wt 256 lb 6.4 oz (116.3 kg)   SpO2 100%   BMI 33.83 kg/m   BP/Weight 11/27/2017 11/13/2017 11/04/2017  Systolic BP 169 142 120  Diastolic BP 95 86 67  Wt. (Lbs) 256.4 252 245  BMI 33.83 33.25 32.32      Physical Exam  Constitutional: He is oriented to person, place, and time. He appears well-developed and well-nourished.  Cardiovascular: Normal rate, normal heart sounds and intact distal pulses.  No murmur heard. Pulmonary/Chest: Effort normal and breath sounds normal. He has no wheezes. He has no rales. He exhibits no tenderness.  Abdominal: Soft. Bowel sounds are normal. He exhibits no distension and no mass. There is no tenderness.  Musculoskeletal: Normal  range of motion.  R big toenail with induration of lateral aspect of nail, no discharge,. Some erythema present  Neurological: He is alert and oriented to person, place, and time.    Lab Results  Component Value Date   HGBA1C 9.4 (A) 11/27/2017    Assessment & Plan:   1. Type 2 diabetes mellitus with foot ulcer (HCC) Unclear if he has type 1 or type 2 DM as his chart reveals both but he informs me he was diagnosed with Type 2 at the age of 59. Uncontrolled with A1c of 9.4 Will send off labs to differentiate between type 1 and 2 and add on Victoza if labs point towards type 2 meanwhile increase dose of Novolin 70/30 Comply with Diabetic diet and lifestyle modifications - POCT glucose (manual entry) - POCT glycosylated hemoglobin (Hb A1C) - Glutamic acid decarboxylase auto abs - C-peptide - insulin NPH-regular Human (NOVOLIN 70/30) (70-30) 100 UNIT/ML injection; Inject 23 Units into the skin 2 (two) times daily with a meal.  Dispense: 30 mL; Refill: 3  2. Ingrown nail of great toe of right foot Completed course of Doxycycline. He is at a high risk given previous toe amputation Advised to present to urgent care if symptoms worsen for removal of toe nail while awaiting Podiatry appointment - podiatry referralplaced again - cephALEXin (KEFLEX) 500 MG capsule; Take 1 capsule (500 mg total) by mouth 2 (two) times daily.  Dispense: 20 capsule; Refill: 0  3. Cellulitis of toe of right foot - cephALEXin (KEFLEX) 500 MG capsule; Take 1 capsule (500 mg total) by mouth 2 (two) times daily.  Dispense: 20 capsule; Refill: 0  4. Essential hypertension Uncontrolled Lisinopril added to regimen - lisinopril (PRINIVIL,ZESTRIL) 5 MG tablet; Take 1 tablet (5 mg total) by mouth daily.  Dispense: 30 tablet; Refill: 3   Meds ordered this encounter  Medications  . lisinopril (PRINIVIL,ZESTRIL) 5 MG tablet    Sig: Take 1 tablet (5 mg total) by mouth daily.    Dispense:  30 tablet    Refill:  3  .  insulin NPH-regular Human (NOVOLIN 70/30) (70-30) 100 UNIT/ML injection    Sig: Inject 23 Units into the skin 2 (two) times daily with a meal.    Dispense:  30 mL    Refill:  3    Discontinue previous dose  . cephALEXin (KEFLEX) 500 MG capsule    Sig: Take 1 capsule (500 mg total) by mouth 2 (two) times daily.    Dispense:  20 capsule    Refill:  0    Follow-up: Return in about 3 weeks (around 12/18/2017) for follow up of foot ulcer.   Hoy Register MD

## 2017-11-28 LAB — C-PEPTIDE: C-Peptide: 1.9 ng/mL (ref 1.1–4.4)

## 2017-11-28 LAB — GLUTAMIC ACID DECARBOXYLASE AUTO ABS: Glutamic Acid Decarb Ab: <5 U/mL (ref 0.0–5.0)

## 2017-11-29 ENCOUNTER — Telehealth: Payer: Self-pay

## 2017-11-29 NOTE — Telephone Encounter (Signed)
-----   Message from Hoy Register, MD sent at 11/29/2017 10:44 AM EDT ----- Labs are in keeping with type 2 diabetes mellitus

## 2017-11-29 NOTE — Telephone Encounter (Signed)
Patient was called and informed of lab results. 

## 2017-12-30 MED FILL — !NOVOLIN 70/30 100 UNITS/ML: (70-30) 100 | 21 days supply | Qty: 10 | Fill #1

## 2017-12-30 MED FILL — LISINOPRIL 5 MG TABLET: 5 | 30 days supply | Qty: 30 | Fill #1

## 2017-12-31 ENCOUNTER — Other Ambulatory Visit: Payer: Self-pay

## 2017-12-31 DIAGNOSIS — E1121 Type 2 diabetes mellitus with diabetic nephropathy: Secondary | ICD-10-CM

## 2017-12-31 MED ORDER — "INSULIN SYRINGE-NEEDLE U-100 31G X 5/16"" 0.3 ML MISC": 3 refills | Status: DC

## 2018-01-15 ENCOUNTER — Encounter: Payer: Self-pay | Admitting: Family Medicine

## 2018-01-15 ENCOUNTER — Ambulatory Visit: Payer: Self-pay | Attending: Family Medicine | Admitting: Family Medicine

## 2018-01-15 VITALS — BP 150/110 | HR 89 | Temp 98.6°F | Ht 73.0 in | Wt 263.2 lb

## 2018-01-15 DIAGNOSIS — Z794 Long term (current) use of insulin: Secondary | ICD-10-CM | POA: Insufficient documentation

## 2018-01-15 DIAGNOSIS — Z79899 Other long term (current) drug therapy: Secondary | ICD-10-CM | POA: Insufficient documentation

## 2018-01-15 DIAGNOSIS — L600 Ingrowing nail: Secondary | ICD-10-CM | POA: Insufficient documentation

## 2018-01-15 DIAGNOSIS — Z9889 Other specified postprocedural states: Secondary | ICD-10-CM | POA: Insufficient documentation

## 2018-01-15 DIAGNOSIS — E11621 Type 2 diabetes mellitus with foot ulcer: Secondary | ICD-10-CM | POA: Insufficient documentation

## 2018-01-15 DIAGNOSIS — I10 Essential (primary) hypertension: Secondary | ICD-10-CM | POA: Insufficient documentation

## 2018-01-15 DIAGNOSIS — Z89421 Acquired absence of other right toe(s): Secondary | ICD-10-CM | POA: Insufficient documentation

## 2018-01-15 DIAGNOSIS — L97509 Non-pressure chronic ulcer of other part of unspecified foot with unspecified severity: Secondary | ICD-10-CM

## 2018-01-15 LAB — GLUCOSE, POCT (MANUAL RESULT ENTRY): POC Glucose: 98 mg/dl (ref 70–99)

## 2018-01-15 MED ORDER — LIRAGLUTIDE 18 MG/3ML ~~LOC~~ SOPN: PEN_INJECTOR | SUBCUTANEOUS | 3 refills | Status: DC

## 2018-01-15 MED ORDER — CLONIDINE HCL 0.1 MG PO TABS
0.1000 mg | ORAL_TABLET | Freq: Once | ORAL | Status: AC
  Administered 2018-01-15: 0.1 mg via ORAL

## 2018-01-15 MED ORDER — ISOSORBIDE MONONITRATE ER 60 MG PO TB24: 60.0000 mg | ORAL_TABLET | Freq: Every day | ORAL | 3 refills | Status: DC

## 2018-01-15 MED ORDER — LISINOPRIL-HYDROCHLOROTHIAZIDE 20-25 MG PO TABS: 1.0000 | ORAL_TABLET | Freq: Every day | ORAL | 3 refills | Status: DC

## 2018-01-15 MED FILL — LISINOPRIL-HCTZ 20-25 MG TA: 20-25 | 30 days supply | Qty: 30 | Fill #0

## 2018-01-15 MED FILL — VICTOZA 18 MG/3 ML INJECT P: 18 | 17 days supply | Qty: 3 | Fill #0

## 2018-01-15 MED FILL — ISOSORBIDE MN ER 60 MG TAB: 60 | 30 days supply | Qty: 30 | Fill #0

## 2018-01-15 NOTE — Patient Instructions (Signed)
Liraglutide injection What is this medicine? LIRAGLUTIDE (LIR a GLOO tide) is used to improve blood sugar control in adults with type 2 diabetes. This medicine may be used with other diabetes medicines. This drug may also reduce the risk of heart attack or stroke if you have type 2 diabetes and risk factors for heart disease. This medicine may be used for other purposes; ask your health care provider or pharmacist if you have questions. COMMON BRAND NAME(S): Victoza What should I tell my health care provider before I take this medicine? They need to know if you have any of these conditions: -endocrine tumors (MEN 2) or if someone in your family had these tumors -gallbladder disease -high cholesterol -history of alcohol abuse problem -history of pancreatitis -kidney disease or if you are on dialysis -liver disease -previous swelling of the tongue, face, or lips with difficulty breathing, difficulty swallowing, hoarseness, or tightening of the throat -stomach problems -thyroid cancer or if someone in your family had thyroid cancer -an unusual or allergic reaction to liraglutide, other medicines, foods, dyes, or preservatives -pregnant or trying to get pregnant -breast-feeding How should I use this medicine? This medicine is for injection under the skin of your upper leg, stomach area, or upper arm. You will be taught how to prepare and give this medicine. Use exactly as directed. Take your medicine at regular intervals. Do not take it more often than directed. It is important that you put your used needles and syringes in a special sharps container. Do not put them in a trash can. If you do not have a sharps container, call your pharmacist or healthcare provider to get one. A special MedGuide will be given to you by the pharmacist with each prescription and refill. Be sure to read this information carefully each time. Talk to your pediatrician regarding the use of this medicine in children.  Special care may be needed. Overdosage: If you think you have taken too much of this medicine contact a poison control center or emergency room at once. NOTE: This medicine is only for you. Do not share this medicine with others. What if I miss a dose? If you miss a dose, take it as soon as you can. If it is almost time for your next dose, take only that dose. Do not take double or extra doses. What may interact with this medicine? -other medicines for diabetes Many medications may cause changes in blood sugar, these include: -alcohol containing beverages -antiviral medicines for HIV or AIDS -aspirin and aspirin-like drugs -certain medicines for blood pressure, heart disease, irregular heart beat -chromium -diuretics -male hormones, such as estrogens or progestins, birth control pills -fenofibrate -gemfibrozil -isoniazid -lanreotide -male hormones or anabolic steroids -MAOIs like Carbex, Eldepryl, Marplan, Nardil, and Parnate -medicines for weight loss -medicines for allergies, asthma, cold, or cough -medicines for depression, anxiety, or psychotic disturbances -niacin -nicotine -NSAIDs, medicines for pain and inflammation, like ibuprofen or naproxen -octreotide -pasireotide -pentamidine -phenytoin -probenecid -quinolone antibiotics such as ciprofloxacin, levofloxacin, ofloxacin -some herbal dietary supplements -steroid medicines such as prednisone or cortisone -sulfamethoxazole; trimethoprim -thyroid hormones Some medications can hide the warning symptoms of low blood sugar (hypoglycemia). You may need to monitor your blood sugar more closely if you are taking one of these medications. These include: -beta-blockers, often used for high blood pressure or heart problems (examples include atenolol, metoprolol, propranolol) -clonidine -guanethidine -reserpine This list may not describe all possible interactions. Give your health care provider a list of all the medicines,    herbs, non-prescription drugs, or dietary supplements you use. Also tell them if you smoke, drink alcohol, or use illegal drugs. Some items may interact with your medicine. What should I watch for while using this medicine? Visit your doctor or health care professional for regular checks on your progress. Drink plenty of fluids while taking this medicine. Check with your doctor or health care professional if you get an attack of severe diarrhea, nausea, and vomiting. The loss of too much body fluid can make it dangerous for you to take this medicine. A test called the HbA1C (A1C) will be monitored. This is a simple blood test. It measures your blood sugar control over the last 2 to 3 months. You will receive this test every 3 to 6 months. Learn how to check your blood sugar. Learn the symptoms of low and high blood sugar and how to manage them. Always carry a quick-source of sugar with you in case you have symptoms of low blood sugar. Examples include hard sugar candy or glucose tablets. Make sure others know that you can choke if you eat or drink when you develop serious symptoms of low blood sugar, such as seizures or unconsciousness. They must get medical help at once. Tell your doctor or health care professional if you have high blood sugar. You might need to change the dose of your medicine. If you are sick or exercising more than usual, you might need to change the dose of your medicine. Do not skip meals. Ask your doctor or health care professional if you should avoid alcohol. Many nonprescription cough and cold products contain sugar or alcohol. These can affect blood sugar. Pens should never be shared. Even if the needle is changed, sharing may result in passing of viruses like hepatitis or HIV. Wear a medical ID bracelet or chain, and carry a card that describes your disease and details of your medicine and dosage times. What side effects may I notice from receiving this medicine? Side effects  that you should report to your doctor or health care professional as soon as possible: -allergic reactions like skin rash, itching or hives, swelling of the face, lips, or tongue -breathing problems -diarrhea that continues or is severe -lump or swelling on the neck -severe nausea -signs and symptoms of infection like fever or chills; cough; sore throat; pain or trouble passing urine -signs and symptoms of low blood sugar such as feeling anxious, confusion, dizziness, increased hunger, unusually weak or tired, sweating, shakiness, cold, irritable, headache, blurred vision, fast heartbeat, loss of consciousness -signs and symptoms of kidney injury like trouble passing urine or change in the amount of urine -trouble swallowing -unusual stomach upset or pain -vomiting Side effects that usually do not require medical attention (report to your doctor or health care professional if they continue or are bothersome): -constipation -decreased appetite -diarrhea -fatigue -headache -nausea -pain, redness, or irritation at site where injected -stomach upset -stuffy or runny nose This list may not describe all possible side effects. Call your doctor for medical advice about side effects. You may report side effects to FDA at 1-800-FDA-1088. Where should I keep my medicine? Keep out of the reach of children. Store unopened pen in a refrigerator between 2 and 8 degrees C (36 and 46 degrees F). Do not freeze or use if the medicine has been frozen. Protect from light and excessive heat. After you first use the pen, it can be stored at room temperature between 15 and 30 degrees C (59 and   86 degrees F) or in a refrigerator. Throw away your used pen after 30 days or after the expiration date, whichever comes first. Do not store your pen with the needle attached. If the needle is left on, medicine may leak from the pen. NOTE: This sheet is a summary. It may not cover all possible information. If you have  questions about this medicine, talk to your doctor, pharmacist, or health care provider.  2018 Elsevier/Gold Standard (2016-02-23 14:39:40)  

## 2018-01-15 NOTE — Progress Notes (Signed)
Subjective:  Patient ID: Jerry Arnold, male    DOB: October 10, 1985  Age: 32 y.o. MRN: [id number]  CC: Hypertension   HPI [name] presents today for follow-up of foot ulcer. Patient has a past medical history of DM, HTN, AKI and status post right second toe amputation in 03/2017. Last seen in the office 11/27/2017 for right big ingrown toe nail.  Foot Ulcer: Patient was prescribed Keflex  Which he has completed .He says the drainage and foul-smelling odor has ceased and resolved; his toenail in fact did fall off.  DM II: (recent HGBa1c of 9.4 (11/27/2017), previously 10.4; Fasting Glucose 98)  Complaint with taking 23 units of Lantus, denies signs and symptoms of hypoglycemia. Compliant with diet and attempting to be more physically active.    HTN: (193/133 initially; Recheck 150/110 ) says he is taking his medication, has been taking the lisinopril and Imdur, however he did not take medication this morning. Says that he sometimes had to double up on medication because he has headaches and home blood pressures have been running high - in the 190s systolic. Complaint with low sodium diet. Complains of headaches and blurred vision. Denies chest pain, facial drooping, speech slurring/difficulty or paralysis.   Past Medical History:  Diagnosis Date  . Osteomyelitis (HCC) 04/11/2017   with "second toe on right foot" injury x 2-3 weeks (04/11/2017)  . Type II diabetes mellitus (HCC)    Past Surgical History:  Procedure Laterality Date  . AMPUTATION Right 04/13/2017   Procedure: SECOND FOOT RAY AMPUTATION;  Surgeon: Nadara Mustard, MD;  Location: [facility] OR;  Service: Orthopedics;  Laterality: Right;  . NO PAST SURGERIES     Social History   Socioeconomic History  . Marital status: Single    Spouse name: Not on file  . Number of children: Not on file  . Years of education: Not on file  . Highest education level: Not on file  Occupational History  . Not on file  Social Needs  . Financial  resource strain: Not on file  . Food insecurity:    Worry: Not on file    Inability: Not on file  . Transportation needs:    Medical: Not on file    Non-medical: Not on file  Tobacco Use  . Smoking status: Never Smoker  . Smokeless tobacco: Never Used  Substance and Sexual Activity  . Alcohol use: Yes    Comment: 04/11/2017 "might have a few drinks/year"  . Drug use: No  . Sexual activity: Yes  Lifestyle  . Physical activity:    Days per week: Not on file    Minutes per session: Not on file  . Stress: Not on file  Relationships  . Social connections:    Talks on phone: Not on file    Gets together: Not on file    Attends religious service: Not on file    Active member of club or organization: Not on file    Attends meetings of clubs or organizations: Not on file    Relationship status: Not on file  . Intimate partner violence:    Fear of current or ex partner: Not on file    Emotionally abused: Not on file    Physically abused: Not on file    Forced sexual activity: Not on file  Other Topics Concern  . Not on file  Social History Narrative  . Not on file     Outpatient Medications Prior to Visit  Medication Sig Dispense  Refill  . insulin NPH-regular Human (NOVOLIN 70/30) (70-30) 100 UNIT/ML injection Inject 23 Units into the skin 2 (two) times daily with a meal. 30 mL 3  . Insulin Syringe-Needle U-100 (TRUEPLUS INSULIN SYRINGE) 31G X 5/16" 0.3 ML MISC Use as directed to administer insulin twice daily 100 each 3  . isosorbide mononitrate (IMDUR) 60 MG 24 hr tablet Take 1 tablet (60 mg total) by mouth daily. 30 tablet 3  . Multiple Vitamins-Minerals (MULTI-BETIC DIABETES) TABS Take 1 tablet by mouth daily.    . mupirocin ointment (BACTROBAN) 2 % Place 1 application into the nose 2 (two) times daily. 22 g 0  . lisinopril (PRINIVIL,ZESTRIL) 5 MG tablet Take 1 tablet (5 mg total) by mouth daily. 30 tablet 3  . cephALEXin (KEFLEX) 500 MG capsule Take 1 capsule (500 mg total)  by mouth 2 (two) times daily. (Patient not taking: Reported on 01/15/2018) 20 capsule 0  . metoCLOPramide (REGLAN) 5 MG tablet Take 1 tablet (5 mg total) by mouth every 6 (six) hours as needed for nausea. (Patient not taking: Reported on 11/27/2017) 30 tablet 3   No facility-administered medications prior to visit.     ROS Review of Systems  Constitutional: Negative for chills, fatigue and fever.  Eyes: Positive for visual disturbance.       Complains of blurred vision at times.  Respiratory: Negative for cough and shortness of breath.   Cardiovascular: Negative for chest pain, palpitations and leg swelling.  Gastrointestinal: Negative.   Endocrine: Negative for polydipsia and polyuria.  Skin: Positive for wound.       Patient reports healing wound to right great toe.  Neurological: Negative for facial asymmetry, speech difficulty, weakness and headaches.    Objective:  BP (!) 170/120   Pulse 89   Temp 98.6 F (37 C) (Oral)   Ht 6\' 1"  (1.854 m)   Wt 263 lb 3.2 oz (119.4 kg)   SpO2 97%   BMI 34.73 kg/m   BP/Weight 01/15/2018 11/27/2017 11/13/2017  Systolic BP 170 169 142  Diastolic BP 120 95 86  Wt. (Lbs) 263.2 256.4 252  BMI 34.73 33.83 33.25   Lab Results  Component Value Date   HGBA1C 9.4 (A) 11/27/2017       Physical Exam  Constitutional: He is oriented to person, place, and time. He appears well-developed and well-nourished. No distress.  Cardiovascular: Normal rate and regular rhythm. Exam reveals no gallop and no friction rub.  No murmur heard. Pulmonary/Chest: Effort normal and breath sounds normal. No respiratory distress.  Abdominal: Soft. Bowel sounds are normal. There is no tenderness.  Musculoskeletal: He exhibits no edema.  Neurological: He is alert and oriented to person, place, and time. He displays normal reflexes.  Skin: Skin is warm and dry. Capillary refill takes less than 2 seconds.  Psychiatric: He has a normal mood and affect. His behavior is  normal. Judgment and thought content normal.     Assessment & Plan:   1. Type 2 diabetes mellitus with foot ulcer, with long-term current use of insulin (HCC) Uncontrolled Visual concerns likely from uncontrolled Diabetes - needs to schedule with Ophthalmology - POCT glucose (98) - Hgba1c 9.4 - Diet, medication compliance and physical activity discussed with patient. He verbalizes understanding - Add Victoza to current medication regimen.  - Start liraglutide (Victoza) 0.6 mg for 1 week then titrate weekly until at desired dosage of 1.8 mg. - liraglutide (VICTOZA) 18 MG/3ML SOPN; Inject subcutaneously daily 0.6 mg for 1 week then  1.2 mg for 1 week then 1.8 mg thereafter.  Dispense: 3 pen; Refill: 3  2. Essential hypertension Uncontrolled D/c Lisinopril, continue Imdur - Prescribed lisinopril-hydrochlorothizide - lisinopril-hydrochlorothiazide (PRINZIDE,ZESTORETIC) 20-25 MG tablet; Take 1 tablet by mouth daily.  Dispense: 90 tablet; Refill: 3  3. Ingrown toenail Resolved - Keflex antibiotic course completed.   Meds ordered this encounter  Medications  . lisinopril-hydrochlorothiazide (PRINZIDE,ZESTORETIC) 20-25 MG tablet    Sig: Take 1 tablet by mouth daily.    Dispense:  90 tablet    Refill:  3    Discontinue lisinopril  . liraglutide (VICTOZA) 18 MG/3ML SOPN    Sig: Inject subcutaneously daily 0.6 mg for 1 week then 1.2 mg for 1 week then 1.8 mg thereafter.    Dispense:  3 pen    Refill:  3    Follow-up: Return in about 3 months (around 04/17/2018) for Follow-up of chronic medical conditions.   Hoy RegisterEnobong Danyela Posas MD

## 2018-01-22 ENCOUNTER — Encounter: Payer: Self-pay | Admitting: Pharmacist

## 2018-01-22 ENCOUNTER — Ambulatory Visit: Payer: Self-pay | Attending: Family Medicine | Admitting: Pharmacist

## 2018-01-22 DIAGNOSIS — IMO0002 Reserved for concepts with insufficient information to code with codable children: Secondary | ICD-10-CM

## 2018-01-22 DIAGNOSIS — Z833 Family history of diabetes mellitus: Secondary | ICD-10-CM | POA: Insufficient documentation

## 2018-01-22 DIAGNOSIS — Z79899 Other long term (current) drug therapy: Secondary | ICD-10-CM | POA: Insufficient documentation

## 2018-01-22 DIAGNOSIS — E119 Type 2 diabetes mellitus without complications: Secondary | ICD-10-CM | POA: Insufficient documentation

## 2018-01-22 DIAGNOSIS — E118 Type 2 diabetes mellitus with unspecified complications: Secondary | ICD-10-CM

## 2018-01-22 DIAGNOSIS — E1165 Type 2 diabetes mellitus with hyperglycemia: Secondary | ICD-10-CM

## 2018-01-22 MED ORDER — INSULIN PEN NEEDLE 32G X 4 MM MISC: 11 refills | Status: DC

## 2018-01-22 NOTE — Progress Notes (Signed)
    S:    PCP: Dr. Alvis LemmingsNewlin   No chief complaint on file.  Patient arrives in good spirits. Presents for Victoza titration at the request of Dr. Alvis LemmingsNewlin. Patient was referred on 01/15/18.  Victoza was started at that visit.  Family/Social History: HTN (mother), never smoker, drinks alcohol only seldomly  Insurance coverage/medication affordability: Self-pay  Patient reports adherence with medications.  Current diabetes medications include: Novolin 70/30 23 units BID, Victoza 0.6 mg daily  Patient denies hypoglycemic events.  Patient reported dietary habits:  - Pt reports limiting carbs and sweet foods -"I adhere to a diabetic diet"  Patient-reported exercise habits:  - Reports walking ~20 minutes daily   Patient denies nocturia.  Patient denies neuropathy. Patient reports improvement in visual changes. Patient reports self foot exams.   O:  POCT glucose: pt did not wish to take; took before coming and was 112 at home Home fasting CBG: reports low 100s  2 hour post-prandial/random CBG: reports highest level in the past week in 160s  Lab Results  Component Value Date   HGBA1C 9.4 (A) 11/27/2017   There were no vitals filed for this visit.  Lipid Panel     Component Value Date/Time   CHOL 154 04/12/2017 0036   TRIG 88 04/12/2017 0036   HDL 41 04/12/2017 0036   CHOLHDL 3.8 04/12/2017 0036   VLDL 18 04/12/2017 0036   LDLCALC 95 04/12/2017 0036    Clinical ASCVD: No  The ASCVD Risk score Denman George(Goff DC Jr., et al., 2013) failed to calculate for the following reasons:   The 2013 ASCVD risk score is only valid for ages 6040 to [address]79    A/P: Diabetes longstanding currently uncontrolled based on A1c of 9.4. Reported home levels are improved. Patient is able to verbalize appropriate hypoglycemia management plan. Patient is adherent with medication. He is tolerating Victoza well. -Continued Novolin 70/30 23 units BID -Increased dose of Victoza to 1.2 starting tomorrow.    -Extensively discussed pathophysiology of DM, recommended lifestyle interventions, dietary effects on glycemic control -Counseled on s/sx of and management of hypoglycemia -HM: flu and Pneumovax given -Next A1C anticipated 02/2018.   ASCVD risk - primary prevention in patient with DM. Last LDL < 100. ASCVD risk score is not calculated because pt is < 40  YO. Statins recommended in DM patients 40-75 YO. Will defer starting statin at this time. Pt encouraged to follow a heart-healthy diet.     Written patient instructions provided.  Total time in face to face counseling 15 minutes.   Follow up Pharmacist Clinic Visit in 01/30/18.    Butch PennyLuke Van Ausdall, PharmD, CPP Clinical Pharmacist [facility] [phone]

## 2018-01-22 NOTE — Patient Instructions (Signed)
Thank you for coming to see me today. Please do the following:  1. Increase Victoza to 1.2 mg daily. 2. Continue Novolin 70/30 23 units 2 times a day. 3. Continue checking blood sugars at home. 4. Continue making the lifestyle changes we've discussed together during our visit. Diet and exercise play a significant role in improving your blood sugars.  5. Follow-up with me next Thursday.   Hypoglycemia or low blood sugar:   Low blood sugar can happen quickly and may become an emergency if not treated right away.   While this shouldn't happen often, it can be brought upon if you skip a meal or do not eat enough. Also, if your insulin or other diabetes medications are dosed too high, this can cause your blood sugar to go to low.   Warning signs of low blood sugar include: 1. Feeling shaky or dizzy 2. Feeling weak or tired  3. Excessive hunger 4. Feeling anxious or upset  5. Sweating even when you aren't exercising  What to do if I experience low blood sugar? 1. Check your blood sugar with your meter. If lower than 70, proceed to step 2.  2. Treat with 3-4 glucose tablets or 3 packets of regular sugar. If these aren't around, you can try hard candy. Yet another option would be to drink 4 ounces of fruit juice or 6 ounces of REGULAR soda.  3. Re-check your sugar in 15 minutes. If it is still below 70, do what you did in step 2 again. If has come back up, go ahead and eat a snack or small meal at this time.

## 2018-01-30 ENCOUNTER — Ambulatory Visit: Payer: Self-pay | Admitting: Pharmacist

## 2018-02-05 MED FILL — !NOVOLIN 70/30 100 UNITS/ML: (70-30) 100 | 21 days supply | Qty: 10 | Fill #2

## 2018-02-14 MED FILL — LISINOPRIL-HCTZ 20-25 MG TA: 20-25 | 30 days supply | Qty: 30 | Fill #1

## 2018-02-14 MED FILL — ISOSORBIDE MN ER 60 MG TAB: 60 | 30 days supply | Qty: 30 | Fill #1

## 2018-03-19 MED FILL — ISOSORBIDE MN ER 60 MG TAB: 60 | 30 days supply | Qty: 30 | Fill #2

## 2018-03-19 MED FILL — !NOVOLIN 70/30 100 UNITS/ML: (70-30) 100 | 21 days supply | Qty: 10 | Fill #3

## 2018-03-19 MED FILL — LISINOPRIL-HCTZ 20-25 MG TA: 20-25 | 30 days supply | Qty: 30 | Fill #2

## 2018-04-17 ENCOUNTER — Ambulatory Visit: Payer: Self-pay | Admitting: Family Medicine

## 2018-04-17 MED FILL — ISOSORBIDE MN ER 60 MG TAB: 60 | 30 days supply | Qty: 30 | Fill #3

## 2018-04-17 MED FILL — !NOVOLIN 70/30 100 UNITS/ML: (70-30) 100 | 21 days supply | Qty: 10 | Fill #4

## 2018-04-17 MED FILL — LISINOPRIL-HCTZ 20-25 MG TA: 20-25 | 30 days supply | Qty: 30 | Fill #3

## 2018-05-14 ENCOUNTER — Other Ambulatory Visit: Payer: Self-pay | Admitting: Family Medicine

## 2018-05-14 DIAGNOSIS — I10 Essential (primary) hypertension: Secondary | ICD-10-CM

## 2018-05-14 MED FILL — LISINOPRIL-HCTZ 20-25 MG TA: 20-25 | 30 days supply | Qty: 30 | Fill #4

## 2018-05-14 MED FILL — !NOVOLIN 70/30 100 UNITS/ML: (70-30) 100 | 21 days supply | Qty: 10 | Fill #5

## 2018-05-19 ENCOUNTER — Telehealth: Payer: Self-pay | Admitting: Family Medicine

## 2018-05-19 MED FILL — ISOSORBIDE MN ER 60 MG TAB: 60 | 30 days supply | Qty: 30 | Fill #0

## 2018-05-19 NOTE — Telephone Encounter (Signed)
Noted. Thanks.

## 2018-05-19 NOTE — Telephone Encounter (Signed)
Patient has a phone visit set up for tomorrow.

## 2018-05-19 NOTE — Telephone Encounter (Signed)
1) Medication(s) Requested (by name): Insulin  2) Pharmacy of Choice: [facility] 3) Special Requests:   Approved medications will be sent to the pharmacy, we will reach out if there is an issue.  Requests made after 3pm may not be addressed until the following business day!  If a patient is unsure of the name of the medication(s) please note and ask patient to call back when they are able to provide all info, do not send to responsible party until all information is available!

## 2018-05-20 ENCOUNTER — Other Ambulatory Visit: Payer: Self-pay

## 2018-05-20 ENCOUNTER — Encounter: Payer: Self-pay | Admitting: Family Medicine

## 2018-05-20 ENCOUNTER — Ambulatory Visit: Payer: Self-pay | Attending: Family Medicine | Admitting: Family Medicine

## 2018-05-20 DIAGNOSIS — E1169 Type 2 diabetes mellitus with other specified complication: Secondary | ICD-10-CM

## 2018-05-20 DIAGNOSIS — I10 Essential (primary) hypertension: Secondary | ICD-10-CM

## 2018-05-20 DIAGNOSIS — Z794 Long term (current) use of insulin: Secondary | ICD-10-CM

## 2018-05-20 MED ORDER — LIRAGLUTIDE 18 MG/3ML ~~LOC~~ SOPN: 0.6000 mg | PEN_INJECTOR | Freq: Every day | SUBCUTANEOUS | 3 refills | Status: DC

## 2018-05-20 MED ORDER — ISOSORBIDE MONONITRATE ER 60 MG PO TB24: 60.0000 mg | ORAL_TABLET | Freq: Every day | ORAL | 3 refills | Status: DC

## 2018-05-20 MED ORDER — INSULIN NPH ISOPHANE & REGULAR (70-30) 100 UNIT/ML ~~LOC~~ SUSP: 23.0000 [IU] | Freq: Two times a day (BID) | SUBCUTANEOUS | 3 refills | Status: DC

## 2018-05-20 NOTE — Progress Notes (Signed)
Virtual Visit via Telephone Note  I connected with Jerry Arnold on 05/20/18 at  2:30 PM EDT by telephone and verified that I am speaking with the correct person using two identifiers.   I discussed the limitations, risks, security and privacy concerns of performing an evaluation and management service by telephone and the availability of in person appointments. I also discussed with the patient that there may be a patient responsible charge related to this service. The patient expressed understanding and agreed to proceed.   History of Present Illness: Patient has a past medical history of DM, HTN, AKI and status post right second toe amputation in 03/2017.  He endorses compliance with his medications but was unable to tolerate Victoza due to nausea and vomiting which he noticed with a higher dose of 1.2 mg but was absent when he was on the 0.6 mg hence he discontinued Victoza altogether but has been compliant with his NPH 23 units twice daily.  Fasting sugars have ranged between 130 and 150. Denies visual concerns, numbness in extremities, hypoglycemia. He has run out of his isosorbide mononitrate and requesting refills.  Observations/Objective: AAOX3 Not in acute distress Normal mood  CMP Latest Ref Rng & Units 08/27/2017 05/29/2017 04/24/2017  Glucose 65 - 99 mg/dL 118(H) 157(H) 103(H)  BUN 6 - 20 mg/dL 19 19 23(H)  Creatinine 0.76 - 1.27 mg/dL 1.40(H) 1.49(H) 2.26(H)  Sodium 134 - 144 mmol/L 142 143 142  Potassium 3.5 - 5.2 mmol/L 4.6 4.6 4.1  Chloride 96 - 106 mmol/L 103 102 100  CO2 20 - 29 mmol/L _0 Calcium 8.7 - 10.2 mg/dL 9.2 9.7 9.0  Total Protein 6.0 - 8.5 g/dL 7.5 8.0 -  Total Bilirubin 0.0 - 1.2 mg/dL <0.2 0.2 -  Alkaline Phos 39 - 117 IU/L 93 105 -  AST 0 - 40 IU/L 13 10 -  ALT 0 - 44 IU/L 14 8 -    Lipid Panel     Component Value Date/Time   CHOL 154 04/12/2017 0036   TRIG 88 04/12/2017 0036   HDL 41 04/12/2017 0036   CHOLHDL 3.8 04/12/2017 0036   VLDL 18  04/12/2017 0036   LDLCALC 95 04/12/2017 0036    Lab Results  Component Value Date   HGBA1C 9.4 (A) 11/27/2017     Assessment and Plan: 1. 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 It is recommended that you undergo annual eye exams and annual foot exams. Pneumonia vaccine is recommended. - insulin NPH-regular Human (70-30) 100 UNIT/ML injection; Inject 23 Units into the skin 2 (two) times daily with a meal.  Dispense: 30 mL; Refill: 3 - liraglutide (VICTOZA) 18 MG/3ML SOPN; Inject 0.1 mLs (0.6 mg total) into the skin daily with breakfast.  Dispense: 3 pen; Refill: 3 - Lipid panel; Future - CMP14+EGFR; Future - Microalbumin/Creatinine Ratio, Urine; Future  2. Essential hypertension Unable to assess control due  to virtual visit Emphasized compliance with medications He is due for labs and has been advised to come for lab appointment in 2 days. Counseled on blood pressure goal of less than 130/80, low-sodium, DASH diet, medication compliance, 150 minutes of moderate intensity exercise per week. Discussed medication compliance, adverse effects. - isosorbide mononitrate (IMDUR) 60 MG 24 hr tablet; Take 1 tablet (60 mg total) by mouth daily.  Dispense: 30 tablet; Refill: 3   Follow Up Instructions:     I discussed the assessment and treatment plan with the patient. The patient was provided an  opportunity to ask questions and all were answered. The patient agreed with the plan and demonstrated an understanding of the instructions.   The patient was advised to call back or seek an in-person evaluation if the symptoms worsen or if the condition fails to improve as anticipated.  I provided 9 minutes of non-face-to-face time during this encounter.   Charlott Rakes, MD

## 2018-05-20 NOTE — Progress Notes (Signed)
Patient has been called and DOB has been verified. Patient has been screened and transferred to PCP to start phone visit.  C/C: diabetes  Refills: none needed.

## 2018-05-22 ENCOUNTER — Other Ambulatory Visit: Payer: Self-pay

## 2018-05-22 ENCOUNTER — Ambulatory Visit: Payer: Self-pay | Attending: Family Medicine

## 2018-05-22 DIAGNOSIS — Z794 Long term (current) use of insulin: Principal | ICD-10-CM

## 2018-05-22 DIAGNOSIS — E1169 Type 2 diabetes mellitus with other specified complication: Secondary | ICD-10-CM

## 2018-05-23 LAB — MICROALBUMIN / CREATININE URINE RATIO
Creatinine, Urine: 118.1 mg/dL
Microalb/Creat Ratio: 315 mg/g creat — ABNORMAL HIGH (ref 0–29)
Microalbumin, Urine: 372 ug/mL

## 2018-05-23 LAB — CMP14+EGFR
ALT: 17 IU/L (ref 0–44)
AST: 14 IU/L (ref 0–40)
Albumin/Globulin Ratio: 1.4 (ref 1.2–2.2)
Albumin: 4.2 g/dL (ref 4.0–5.0)
Alkaline Phosphatase: 109 IU/L (ref 39–117)
BUN/Creatinine Ratio: 18 (ref 9–20)
BUN: 29 mg/dL — ABNORMAL HIGH (ref 6–20)
Bilirubin Total: 0.3 mg/dL (ref 0.0–1.2)
CO2: 23 mmol/L (ref 20–29)
Calcium: 9.3 mg/dL (ref 8.7–10.2)
Chloride: 102 mmol/L (ref 96–106)
Creatinine, Ser: 1.62 mg/dL — ABNORMAL HIGH (ref 0.76–1.27)
GFR calc Af Amer: 64 mL/min/{1.73_m2} (ref >59)
GFR calc non Af Amer: 55 mL/min/{1.73_m2} — ABNORMAL LOW (ref >59)
Globulin, Total: 2.9 g/dL (ref 1.5–4.5)
Glucose: 199 mg/dL — ABNORMAL HIGH (ref 65–99)
Potassium: 4.9 mmol/L (ref 3.5–5.2)
Sodium: 139 mmol/L (ref 134–144)
Total Protein: 7.1 g/dL (ref 6.0–8.5)

## 2018-05-23 LAB — HEMOGLOBIN A1C
Est. average glucose Bld gHb Est-mCnc: 298 mg/dL
Hgb A1c MFr Bld: 12 % — ABNORMAL HIGH (ref 4.8–5.6)

## 2018-05-23 LAB — LIPID PANEL
Chol/HDL Ratio: 6.7 ratio — ABNORMAL HIGH (ref 0.0–5.0)
Cholesterol, Total: 248 mg/dL — ABNORMAL HIGH (ref 100–199)
HDL: 37 mg/dL — ABNORMAL LOW (ref >39)
LDL Calculated: 146 mg/dL — ABNORMAL HIGH (ref 0–99)
Triglycerides: 324 mg/dL — ABNORMAL HIGH (ref 0–149)
VLDL Cholesterol Cal: 65 mg/dL — ABNORMAL HIGH (ref 5–40)

## 2018-05-26 ENCOUNTER — Telehealth: Payer: Self-pay

## 2018-05-26 NOTE — Telephone Encounter (Signed)
-----   Message from Hoy Register, MD sent at 05/23/2018 12:57 PM EDT ----- A1c is 12.0 which is up from 9.4 previously.  Please advised to comply with regimen change we had discussed at his last office visit along with a diabetic diet and lifestyle modifications.

## 2018-05-26 NOTE — Telephone Encounter (Signed)
Patient name and DOB has been verified Patient was informed of lab results. Patient had no questions.  

## 2018-06-13 MED FILL — LISINOPRIL-HCTZ 20-25 MG TA: 20-25 | 30 days supply | Qty: 30 | Fill #5

## 2018-06-13 MED FILL — $novoLIN 70/30 100 UNITS/ML: (70-30) 100 | 86 days supply | Qty: 40 | Fill #6

## 2018-07-03 ENCOUNTER — Other Ambulatory Visit: Payer: Self-pay

## 2018-07-03 ENCOUNTER — Emergency Department (HOSPITAL_COMMUNITY)
Admission: EM | Admit: 2018-07-03 | Discharge: 2018-07-03 | Disposition: A | Discharge status: Home / Self Care | Payer: Self-pay | Attending: Emergency Medicine | Admitting: Emergency Medicine

## 2018-07-03 DIAGNOSIS — Z79899 Other long term (current) drug therapy: Secondary | ICD-10-CM | POA: Insufficient documentation

## 2018-07-03 DIAGNOSIS — I10 Essential (primary) hypertension: Secondary | ICD-10-CM | POA: Insufficient documentation

## 2018-07-03 DIAGNOSIS — B029 Zoster without complications: Secondary | ICD-10-CM | POA: Insufficient documentation

## 2018-07-03 DIAGNOSIS — Z794 Long term (current) use of insulin: Secondary | ICD-10-CM | POA: Insufficient documentation

## 2018-07-03 DIAGNOSIS — E119 Type 2 diabetes mellitus without complications: Secondary | ICD-10-CM | POA: Insufficient documentation

## 2018-07-03 MED ORDER — ACYCLOVIR 400 MG PO TABS: 400.0000 mg | ORAL_TABLET | Freq: Four times a day (QID) | ORAL | 0 refills | Status: DC

## 2018-07-03 NOTE — ED Triage Notes (Signed)
Pt reports rash on chest and back onset 3 days ago is itching and painful

## 2018-07-03 NOTE — ED Provider Notes (Signed)
[facility]-EMERGENCY DEPT Provider Note   CSN: [id number] Arrival date & time: 07/03/18  1910    History   Chief Complaint Chief Complaint  Patient presents with  . Rash    HPI Jerry Arnold is a 33 y.o. male.     The history is provided by the patient. No language interpreter was used.  Rash     33 year old male with history of diabetes, hypertension, prior skin infection presenting for evaluation of a rash.  Patient report for the past 2 to 3 days he has noticed an itchy and painful rash that initially started in his lower back and spread to his abdomen.  Symptom is moderate in severity, mostly itchy but also burning.  Rash not associate with fever, headache, chest pain, trouble breathing, abdominal pain, nausea vomiting or diarrhea.  No recent sick contact.  No recent sickness or any significant change in soap or detergent or environmental changes.  He has been taking Benadryl with some improvement.  He has had chickenpox in the past.  Past Medical History:  Diagnosis Date  . Osteomyelitis (HCC) 04/11/2017   with "second toe on right foot" injury x 2-3 weeks (04/11/2017)  . Type II diabetes mellitus [facility])     Patient Active Problem List   Diagnosis Date Noted  . Hypertension 05/28/2017  . Amputated toe of right foot (HCC) 04/24/2017  . AKI (acute kidney injury) (HCC) 04/16/2017  . Nausea and vomiting 04/16/2017  . Cellulitis of foot   . Acute osteomyelitis of toe, right (HCC) 04/12/2017  . Gangrene of toe of right foot (HCC) 04/12/2017  . Anemia 04/11/2017  . Diabetes (HCC) 04/11/2017    Past Surgical History:  Procedure Laterality Date  . AMPUTATION Right 04/13/2017   Procedure: SECOND FOOT RAY AMPUTATION;  Surgeon: Nadara Mustard, MD;  Location: [facility] OR;  Service: Orthopedics;  Laterality: Right;  . NO PAST SURGERIES          Home Medications    Prior to Admission medications   Medication Sig Start Date End Date Taking? Authorizing  Provider  cephALEXin (KEFLEX) 500 MG capsule Take 1 capsule (500 mg total) by mouth 2 (two) times daily. Patient not taking: Reported on 01/15/2018 11/27/17   Hoy Register, MD  insulin NPH-regular Human (70-30) 100 UNIT/ML injection Inject 23 Units into the skin 2 (two) times daily with a meal. 05/20/18   Newlin, Enobong, MD  Insulin Pen Needle (TRUEPLUS PEN NEEDLES) 32G X 4 MM MISC Use as directed to inject Victoza. 01/22/18   Hoy Register, MD  Insulin Syringe-Needle U-100 (TRUEPLUS INSULIN SYRINGE) 31G X 5/16" 0.3 ML MISC Use as directed to administer insulin twice daily 12/31/17   Hoy Register, MD  isosorbide mononitrate (IMDUR) 60 MG 24 hr tablet Take 1 tablet (60 mg total) by mouth daily. 05/20/18   Hoy Register, MD  liraglutide (VICTOZA) 18 MG/3ML SOPN Inject 0.1 mLs (0.6 mg total) into the skin daily with breakfast. 05/20/18   Hoy Register, MD  lisinopril-hydrochlorothiazide (PRINZIDE,ZESTORETIC) 20-25 MG tablet Take 1 tablet by mouth daily. 01/15/18   Hoy Register, MD  metoCLOPramide (REGLAN) 5 MG tablet Take 1 tablet (5 mg total) by mouth every 6 (six) hours as needed for nausea. Patient not taking: Reported on 11/27/2017 04/24/17   Vivianne Master, PA-C  Multiple Vitamins-Minerals (MULTI-BETIC DIABETES) TABS Take 1 tablet by mouth daily.    [provider]  mupirocin ointment (BACTROBAN) 2 % Place 1 application into the nose 2 (two) times  daily. Patient not taking: Reported on 05/20/2018 11/13/17   Anders SimmondsMcClung, Angela M, PA-C    Family History Family History  Problem Relation Age of Onset  . Hypertension Mother   . Hypertension Maternal Grandmother   . Hypertension Paternal Grandmother     Social History Social History   Tobacco Use  . Smoking status: Never Smoker  . Smokeless tobacco: Never Used  Substance Use Topics  . Alcohol use: Yes    Comment: 04/11/2017 "might have a few drinks/year"  . Drug use: No     Allergies   Patient has no known allergies.    Review of Systems Review of Systems  Skin: Positive for rash.  All other systems reviewed and are negative.    Physical Exam Updated Vital Signs BP (!) 150/95 (BP Location: Left Arm)   Pulse 97   Temp 98.8 F (37.1 C) (Oral)   Resp 18   SpO2 99%   Physical Exam Vitals signs and nursing note reviewed.  Constitutional:      General: He is not in acute distress.    Appearance: He is well-developed.  HENT:     Head: Atraumatic.  Eyes:     Conjunctiva/sclera: Conjunctivae normal.  Neck:     Musculoskeletal: Neck supple.  Skin:    Findings: Rash (Vesicular lesions following dermatomal pattern manifest on the left low back and wraps to the abdomen.) present.  Neurological:     Mental Status: He is alert.      ED Treatments / Results  Labs (all labs ordered are listed, but only abnormal results are displayed) Labs Reviewed - No data to display  EKG None  Radiology No results found.  Procedures Procedures (including critical care time)  Medications Ordered in ED Medications - No data to display   Initial Impression / Assessment and Plan / ED Course  I have reviewed the triage vital signs and the nursing notes.  Pertinent labs & imaging results that were available during my care of the patient were reviewed by me and considered in my medical decision making (see chart for details).        BP (!) 150/95 (BP Location: Left Arm)   Pulse 97   Temp 98.8 F (37.1 C) (Oral)   Resp 18   SpO2 99%    Final Clinical Impressions(s) / ED Diagnoses   Final diagnoses:  Herpes zoster without complication    ED Discharge Orders         Ordered    acyclovir (ZOVIRAX) 400 MG tablet  4 times daily     07/03/18 1949         7:48 PM Patient presents with vesicular rash noted to his left lower back wraps to his abdomen follow dermatomal pattern inconsistent with shingles rash.  Will treat with acyclovir.  Return precaution discussed.   Fayrene Helperran, Ziasia Lenoir, PA-C  07/03/18 2011    Arby BarrettePfeiffer, Marcy, MD 07/16/18 [id number]

## 2018-07-04 MED FILL — ACYCLOVIR 400 MG TABLET: 400 | 12 days supply | Qty: 50 | Fill #0

## 2018-07-15 MED FILL — ISOSORBIDE MN ER 60 MG TAB: 60 | 30 days supply | Qty: 30 | Fill #1

## 2018-07-15 MED FILL — LISINOPRIL-HCTZ 20-25 MG TA: 20-25 | 30 days supply | Qty: 30 | Fill #6

## 2018-08-15 MED FILL — LISINOPRIL-HCTZ 20-25 MG TA: 20-25 | 30 days supply | Qty: 30 | Fill #7

## 2018-08-15 MED FILL — ISOSORBIDE MN ER 60 MG TAB: 60 | 30 days supply | Qty: 30 | Fill #2

## 2018-09-07 ENCOUNTER — Other Ambulatory Visit: Payer: Self-pay

## 2018-09-07 ENCOUNTER — Emergency Department (HOSPITAL_COMMUNITY)
Admission: EM | Admit: 2018-09-07 | Discharge: 2018-09-07 | Disposition: A | Discharge status: Home / Self Care | Payer: HRSA Program | Attending: Emergency Medicine | Admitting: Emergency Medicine

## 2018-09-07 ENCOUNTER — Encounter (HOSPITAL_COMMUNITY): Payer: Self-pay

## 2018-09-07 DIAGNOSIS — U071 COVID-19: Secondary | ICD-10-CM | POA: Diagnosis not present

## 2018-09-07 DIAGNOSIS — R0789 Other chest pain: Secondary | ICD-10-CM | POA: Diagnosis present

## 2018-09-07 DIAGNOSIS — I10 Essential (primary) hypertension: Secondary | ICD-10-CM | POA: Insufficient documentation

## 2018-09-07 DIAGNOSIS — Z79899 Other long term (current) drug therapy: Secondary | ICD-10-CM | POA: Diagnosis not present

## 2018-09-07 DIAGNOSIS — E119 Type 2 diabetes mellitus without complications: Secondary | ICD-10-CM | POA: Insufficient documentation

## 2018-09-07 DIAGNOSIS — Z794 Long term (current) use of insulin: Secondary | ICD-10-CM | POA: Diagnosis not present

## 2018-09-07 LAB — BASIC METABOLIC PANEL
Anion gap: 11 (ref 5–15)
BUN: 21 mg/dL — ABNORMAL HIGH (ref 6–20)
CO2: 24 mmol/L (ref 22–32)
Calcium: 9.1 mg/dL (ref 8.9–10.3)
Chloride: 102 mmol/L (ref 98–111)
Creatinine, Ser: 2.21 mg/dL — ABNORMAL HIGH (ref 0.61–1.24)
GFR calc Af Amer: 44 mL/min — ABNORMAL LOW (ref >60)
GFR calc non Af Amer: 38 mL/min — ABNORMAL LOW (ref >60)
Glucose, Bld: 138 mg/dL — ABNORMAL HIGH (ref 70–99)
Potassium: 5.1 mmol/L (ref 3.5–5.1)
Sodium: 137 mmol/L (ref 135–145)

## 2018-09-07 LAB — CBC WITH DIFFERENTIAL/PLATELET
Abs Immature Granulocytes: 0.01 10*3/uL (ref 0.00–0.07)
Basophils Absolute: 0 10*3/uL (ref 0.0–0.1)
Basophils Relative: 0 %
Eosinophils Absolute: 0 10*3/uL (ref 0.0–0.5)
Eosinophils Relative: 0 %
HCT: 41.2 % (ref 39.0–52.0)
Hemoglobin: 13.4 g/dL (ref 13.0–17.0)
Immature Granulocytes: 0 %
Lymphocytes Relative: 34 %
Lymphs Abs: 1.4 10*3/uL (ref 0.7–4.0)
MCH: 28.8 pg (ref 26.0–34.0)
MCHC: 32.5 g/dL (ref 30.0–36.0)
MCV: 88.4 fL (ref 80.0–100.0)
Monocytes Absolute: 0.3 10*3/uL (ref 0.1–1.0)
Monocytes Relative: 8 %
Neutro Abs: 2.3 10*3/uL (ref 1.7–7.7)
Neutrophils Relative %: 58 %
Platelets: 193 10*3/uL (ref 150–400)
RBC: 4.66 MIL/uL (ref 4.22–5.81)
RDW: 14 % (ref 11.5–15.5)
WBC: 4 10*3/uL (ref 4.0–10.5)
nRBC: 0 % (ref 0.0–0.2)

## 2018-09-07 LAB — CBG MONITORING, ED: Glucose-Capillary: 106 mg/dL — ABNORMAL HIGH (ref 70–99)

## 2018-09-07 LAB — SARS CORONAVIRUS 2 BY RT PCR (HOSPITAL ORDER, PERFORMED IN ~~LOC~~ HOSPITAL LAB): SARS Coronavirus 2: POSITIVE — AB

## 2018-09-07 MED ORDER — ACETAMINOPHEN 500 MG PO TABS: 500.0000 mg | ORAL_TABLET | Freq: Four times a day (QID) | ORAL | 0 refills | Status: AC | PRN

## 2018-09-07 MED ORDER — MORPHINE SULFATE (PF) 4 MG/ML IV SOLN
4.0000 mg | Freq: Once | INTRAVENOUS | Status: AC
  Administered 2018-09-07: 4 mg via INTRAVENOUS
  Filled 2018-09-07: qty 1

## 2018-09-07 MED ORDER — MORPHINE SULFATE (PF) 4 MG/ML IV SOLN
4.0000 mg | Freq: Once | INTRAVENOUS | Status: DC
  Filled 2018-09-07: qty 1

## 2018-09-07 MED ORDER — SODIUM CHLORIDE 0.9 % IV BOLUS
1000.0000 mL | Freq: Once | INTRAVENOUS | Status: AC
  Administered 2018-09-07: 13:00:00 1000 mL via INTRAVENOUS

## 2018-09-07 MED ORDER — ONDANSETRON 4 MG PO TBDP
4.0000 mg | ORAL_TABLET | Freq: Once | ORAL | Status: AC
  Administered 2018-09-07: 12:00:00 4 mg via ORAL
  Filled 2018-09-07: qty 1

## 2018-09-07 MED ORDER — PROMETHAZINE HCL 25 MG/ML IJ SOLN
25.0000 mg | Freq: Once | INTRAMUSCULAR | Status: AC
  Administered 2018-09-07: 13:00:00 25 mg via INTRAVENOUS
  Filled 2018-09-07: qty 1

## 2018-09-07 MED ORDER — PROMETHAZINE HCL 25 MG PO TABS: 25.0000 mg | ORAL_TABLET | Freq: Four times a day (QID) | ORAL | 0 refills | Status: DC | PRN

## 2018-09-07 NOTE — Discharge Instructions (Addendum)
You have been tested positive for COVID-19 infection.  Follow instruction below.  Return if you have any concerns.

## 2018-09-07 NOTE — ED Notes (Signed)
Pt unable to provide a urine sample. 

## 2018-09-07 NOTE — ED Notes (Signed)
Patient verbalizes understanding of discharge instructions. Opportunity for questioning and answers were provided. Armband removed by staff, pt discharged from ED.  

## 2018-09-07 NOTE — ED Triage Notes (Signed)
Pt states he has had n/v, back pain, chest pain, and loss of taste and smell x5 days.  Pt states he was tested for Covid on 09/05/18.  Denies shortness of breath

## 2018-09-07 NOTE — ED Provider Notes (Signed)
[facility] EMERGENCY [facility]PARTMENT Provider Note   CSN: [id number] Arrival date & time: 09/07/18  1118     History   Chief Complaint Chief Complaint  Patient presents with  . Back Pain  . Chest Pain  . Cough  . Nausea  . Weakness    HPI Jerry Arnold is a 33 y.o. male.     The history is provided by the patient. No language interpreter was used.  Back Pain Associated symptoms: chest pain and weakness   Chest Pain Associated symptoms: back pain, cough and weakness   Cough Associated symptoms: chest pain   Weakness Associated symptoms: chest pain and cough      33 year old male with history of diabetes, hypertension, presenting with cold symptoms.  Patient report for the past 5 days he has had loss of sense of taste, loss of smells, mild body aches, decrease in appetite, persistent nausea, occasional vomiting, nonproductive cough as well as low back pain.  Symptom has been persistent, nausea seems to bother him the most.  Does not complain of significant fever, or shortness of breath.  He was initially tested for COVID-19 2 days ago but the results have not come back yet.  He admits to recently traveled to [place] and stayed there for approximately 5 days with a group of friends recently discovered back.  He does not recall any specific sick contact with anyone with COVID-19.  He denies any significant treatment tried.  He rates his body aches as 8 out of 10.  Past Medical History:  Diagnosis Date  . Osteomyelitis (HCC) 04/11/2017   with "second toe on right foot" injury x 2-3 weeks (04/11/2017)  . Type II diabetes mellitus [facility](HCC)     Patient Active Problem List   Diagnosis Date Noted  . Hypertension 05/28/2017  . Amputated toe of right foot (HCC) 04/24/2017  . AKI (acute kidney injury) (HCC) 04/16/2017  . Nausea and vomiting 04/16/2017  . Cellulitis of foot   . Acute osteomyelitis of toe, right (HCC) 04/12/2017  . Gangrene of toe of right foot (HCC)  04/12/2017  . Anemia 04/11/2017  . Diabetes (HCC) 04/11/2017    Past Surgical History:  Procedure Laterality Date  . AMPUTATION Right 04/13/2017   Procedure: SECOND FOOT RAY AMPUTATION;  Surgeon: Nadara Mustarduda, Marcus V, MD;  [facility];  Service: Orthopedics;  Laterality: Right;  . NO PAST SURGERIES          Home Medications    Prior to Admission medications   Medication Sig Start Date End Date Taking? Authorizing Provider  acyclovir (ZOVIRAX) 400 MG tablet Take 1 tablet (400 mg total) by mouth 4 (four) times daily. 07/03/18   Fayrene Helperran, Denym Christenberry, PA-C  cephALEXin (KEFLEX) 500 MG capsule Take 1 capsule (500 mg total) by mouth 2 (two) times daily. Patient not taking: Reported on 01/15/2018 11/27/17   Hoy RegisterNewlin, Enobong, MD  insulin NPH-regular Human (70-30) 100 UNIT/ML injection Inject 23 Units into the skin 2 (two) times daily with a meal. 05/20/18   Newlin, Enobong, MD  Insulin Pen Needle (TRUEPLUS PEN NEEDLES) 32G X 4 MM MISC Use as directed to inject Victoza. 01/22/18   Hoy RegisterNewlin, Enobong, MD  Insulin Syringe-Needle U-100 (TRUEPLUS INSULIN SYRINGE) 31G X 5/16" 0.3 ML MISC Use as directed to administer insulin twice daily 12/31/17   Hoy RegisterNewlin, Enobong, MD  isosorbide mononitrate (IMDUR) 60 MG 24 hr tablet Take 1 tablet (60 mg total) by mouth daily. 05/20/18   Hoy RegisterNewlin, Enobong, MD  liraglutide (VICTOZA)  18 MG/3ML SOPN Inject 0.1 mLs (0.6 mg total) into the skin daily with breakfast. 05/20/18   Charlott Rakes, MD  lisinopril-hydrochlorothiazide (PRINZIDE,ZESTORETIC) 20-25 MG tablet Take 1 tablet by mouth daily. 01/15/18   Charlott Rakes, MD  metoCLOPramide (REGLAN) 5 MG tablet Take 1 tablet (5 mg total) by mouth every 6 (six) hours as needed for nausea. Patient not taking: Reported on 11/27/2017 04/24/17   Brayton Caves, PA-C  Multiple Vitamins-Minerals (MULTI-BETIC DIABETES) TABS Take 1 tablet by mouth daily.    [provider]  mupirocin ointment (BACTROBAN) 2 % Place 1 application into the nose 2  (two) times daily. Patient not taking: Reported on 05/20/2018 11/13/17   Argentina Donovan, PA-C    Family History Family History  Problem Relation Age of Onset  . Hypertension Mother   . Hypertension Maternal Grandmother   . Hypertension Paternal Grandmother     Social History Social History   Tobacco Use  . Smoking status: Never Smoker  . Smokeless tobacco: Never Used  Substance Use Topics  . Alcohol use: Yes    Comment: 04/11/2017 "might have a few drinks/year"  . Drug use: No     Allergies   Patient has no known allergies.   Review of Systems Review of Systems  Respiratory: Positive for cough.   Cardiovascular: Positive for chest pain.  Musculoskeletal: Positive for back pain.  Neurological: Positive for weakness.  All other systems reviewed and are negative.    Physical Exam Updated Vital Signs BP 126/84 (BP Location: Right Arm)   Pulse (!) 105   Temp 99.7 F (37.6 C) (Oral)   Resp 19   SpO2 98%   Physical Exam Vitals signs and nursing note reviewed.  Constitutional:      General: He is not in acute distress.    Appearance: He is well-developed. He is obese.  HENT:     Head: Atraumatic.  Eyes:     Conjunctiva/sclera: Conjunctivae normal.  Neck:     Musculoskeletal: Neck supple.  Cardiovascular:     Rate and Rhythm: Normal rate and regular rhythm.     Pulses: Normal pulses.     Heart sounds: Normal heart sounds.  Pulmonary:     Breath sounds: Normal breath sounds. No wheezing, rhonchi or rales.  Abdominal:     Palpations: Abdomen is soft.     Tenderness: There is no abdominal tenderness.  Skin:    Findings: No rash.  Neurological:     Mental Status: He is alert and oriented to person, place, and time.  Psychiatric:        Mood and Affect: Mood normal.      ED Treatments / Results  Labs (all labs ordered are listed, but only abnormal results are displayed) Labs Reviewed  SARS CORONAVIRUS 2 ([place]) - Abnormal; Notable for the following components:      Result Value   SARS Coronavirus 2 POSITIVE (*)    All other components within normal limits  BASIC METABOLIC PANEL - Abnormal; Notable for the following components:   Glucose, Bld 138 (*)    BUN 21 (*)    Creatinine, Ser 2.21 (*)    GFR calc non Af Amer 38 (*)    GFR calc Af Amer 44 (*)    All other components within normal limits  CBG MONITORING, ED - Abnormal; Notable for the following components:   Glucose-Capillary 106 (*)    All other components within normal  limits  CBC WITH DIFFERENTIAL/PLATELET  URINALYSIS, ROUTINE W REFLEX MICROSCOPIC    EKG None  Radiology No results found.  Procedures Procedures (including critical care time)  Medications Ordered in ED Medications  morphine 4 MG/ML injection 4 mg (0 mg Intravenous Hold 09/07/18 1548)  ondansetron (ZOFRAN-ODT) disintegrating tablet 4 mg (4 mg Oral Given 09/07/18 1211)  sodium chloride 0.9 % bolus 1,000 mL (1,000 mLs Intravenous New Bag/Given 09/07/18 1325)  promethazine (PHENERGAN) injection 25 mg (25 mg Intravenous Given 09/07/18 1328)  morphine 4 MG/ML injection 4 mg (4 mg Intravenous Given 09/07/18 1331)     Initial Impression / Assessment and Plan / ED Course  I have reviewed the triage vital signs and the nursing notes.  Pertinent labs & imaging results that were available during my care of the patient were reviewed by me and considered in my medical decision making (see chart for details).        BP 126/84 (BP Location: Right Arm)   Pulse (!) 105   Temp 99.7 F (37.6 C) (Oral)   Resp 19   Ht 6\' 1"  (1.854 m)   Wt 120.7 kg   SpO2 98%   BMI 35.09 kg/m    Final Clinical Impressions(s) / ED Diagnoses   Final diagnoses:  COVID-19 virus infection    ED Discharge Orders         Ordered    promethazine (PHENERGAN) 25 MG tablet  Every 6 hours PRN     09/07/18 1549    acetaminophen (TYLENOL) 500 MG tablet  Every 6 hours PRN      09/07/18 1549         12:01 PM Patient here with complaints of loss of sense of taste and smell as well as other symptom suggestive of COVID-19.  He also endorsed persistent nausea and vomiting without any significant abdominal pain.  He is overall well-appearing, lungs are clear to auscultation, no hypoxia.  He does not have any symptoms that warranted hospital admission.  He does endorse nausea, and Zofran given.  He has had recent COVID-19 testing 2 days ago which has not resulted yet.  Encourage patient to self quarantine, wash his hand, and wear mask as if he does have COVID-19.  I also provided strict return precaution.  Jerry RangSamuel Arnold was evaluated in Emergency Department on 09/07/2018 for the symptoms described in the history of present illness. He was evaluated in the context of the global COVID-19 pandemic, which necessitated consideration that the patient might be at risk for infection with the SARS-CoV-2 virus that causes COVID-19. Institutional protocols and algorithms that pertain to the evaluation of patients at risk for COVID-19 are in a state of rapid change based on information released by regulatory bodies including the CDC and federal and state organizations. These policies and algorithms were followed during the patient's care in the ED.  12:40 PM After receiving Zofran, patient endorsed no improvement of his symptoms.  He still felt very nauseous and dry heaving.  Therefore will obtain labs, check CBG, give IV fluid, and will continue with symptom management.  3:02 PM Patient report improvement of his nausea with Phenergan.  He does have evidence of AKI with creatinine of 2.21.  IV fluid given.  CBG is normal.  Still endorsing pain, additional pain medication given.  3:48 PM COVID-19 test came back positive.  Patient however felt better after receiving IV fluid, pain medication as well as antinausea medication.  He is stable for discharge.  Will prescribe  Phenergan and strict  return precaution was given.  Patient understands to self quarantine.   Fayrene Helperran, Matheu Ploeger, PA-C 09/07/18 1550    Sabas SousBero, Michael M, MD 09/08/18 [phone]

## 2018-09-08 MED FILL — PROMETHAZINE 25 MG TABLET: 25 | 4 days supply | Qty: 20 | Fill #0 | Status: TO

## 2018-09-10 ENCOUNTER — Other Ambulatory Visit: Payer: Self-pay

## 2018-09-10 ENCOUNTER — Emergency Department (HOSPITAL_COMMUNITY)
Admission: EM | Admit: 2018-09-10 | Discharge: 2018-09-10 | Discharge status: Against Medical Advice | Payer: Self-pay | Attending: Emergency Medicine | Admitting: Emergency Medicine

## 2018-09-10 ENCOUNTER — Encounter (HOSPITAL_COMMUNITY): Payer: Self-pay | Admitting: Emergency Medicine

## 2018-09-10 ENCOUNTER — Emergency Department (HOSPITAL_COMMUNITY): Payer: Self-pay

## 2018-09-10 DIAGNOSIS — E119 Type 2 diabetes mellitus without complications: Secondary | ICD-10-CM | POA: Insufficient documentation

## 2018-09-10 DIAGNOSIS — Z794 Long term (current) use of insulin: Secondary | ICD-10-CM | POA: Insufficient documentation

## 2018-09-10 DIAGNOSIS — N179 Acute kidney failure, unspecified: Secondary | ICD-10-CM | POA: Insufficient documentation

## 2018-09-10 DIAGNOSIS — Z79899 Other long term (current) drug therapy: Secondary | ICD-10-CM | POA: Insufficient documentation

## 2018-09-10 DIAGNOSIS — U071 COVID-19: Secondary | ICD-10-CM | POA: Insufficient documentation

## 2018-09-10 DIAGNOSIS — Z5320 Procedure and treatment not carried out because of patient's decision for unspecified reasons: Secondary | ICD-10-CM | POA: Insufficient documentation

## 2018-09-10 LAB — CBC WITH DIFFERENTIAL/PLATELET
Abs Immature Granulocytes: 0.01 10*3/uL (ref 0.00–0.07)
Basophils Absolute: 0 10*3/uL (ref 0.0–0.1)
Basophils Relative: 0 %
Eosinophils Absolute: 0 10*3/uL (ref 0.0–0.5)
Eosinophils Relative: 0 %
HCT: 38.3 % — ABNORMAL LOW (ref 39.0–52.0)
Hemoglobin: 12.3 g/dL — ABNORMAL LOW (ref 13.0–17.0)
Immature Granulocytes: 0 %
Lymphocytes Relative: 32 %
Lymphs Abs: 1.4 10*3/uL (ref 0.7–4.0)
MCH: 28.5 pg (ref 26.0–34.0)
MCHC: 32.1 g/dL (ref 30.0–36.0)
MCV: 88.7 fL (ref 80.0–100.0)
Monocytes Absolute: 0.4 10*3/uL (ref 0.1–1.0)
Monocytes Relative: 9 %
Neutro Abs: 2.7 10*3/uL (ref 1.7–7.7)
Neutrophils Relative %: 59 %
Platelets: 151 10*3/uL (ref 150–400)
RBC: 4.32 MIL/uL (ref 4.22–5.81)
RDW: 13.7 % (ref 11.5–15.5)
WBC: 4.6 10*3/uL (ref 4.0–10.5)
nRBC: 0 % (ref 0.0–0.2)

## 2018-09-10 LAB — COMPREHENSIVE METABOLIC PANEL
ALT: 21 U/L (ref 0–44)
AST: 26 U/L (ref 15–41)
Albumin: 3.9 g/dL (ref 3.5–5.0)
Alkaline Phosphatase: 78 U/L (ref 38–126)
Anion gap: 11 (ref 5–15)
BUN: 30 mg/dL — ABNORMAL HIGH (ref 6–20)
CO2: 23 mmol/L (ref 22–32)
Calcium: 8.4 mg/dL — ABNORMAL LOW (ref 8.9–10.3)
Chloride: 103 mmol/L (ref 98–111)
Creatinine, Ser: 2.43 mg/dL — ABNORMAL HIGH (ref 0.61–1.24)
GFR calc Af Amer: 39 mL/min — ABNORMAL LOW (ref >60)
GFR calc non Af Amer: 34 mL/min — ABNORMAL LOW (ref >60)
Glucose, Bld: 149 mg/dL — ABNORMAL HIGH (ref 70–99)
Potassium: 4.4 mmol/L (ref 3.5–5.1)
Sodium: 137 mmol/L (ref 135–145)
Total Bilirubin: 0.5 mg/dL (ref 0.3–1.2)
Total Protein: 8.2 g/dL — ABNORMAL HIGH (ref 6.5–8.1)

## 2018-09-10 LAB — BASIC METABOLIC PANEL
Anion gap: 9 (ref 5–15)
BUN: 30 mg/dL — ABNORMAL HIGH (ref 6–20)
CO2: 25 mmol/L (ref 22–32)
Calcium: 8.1 mg/dL — ABNORMAL LOW (ref 8.9–10.3)
Chloride: 104 mmol/L (ref 98–111)
Creatinine, Ser: 2.22 mg/dL — ABNORMAL HIGH (ref 0.61–1.24)
GFR calc Af Amer: 44 mL/min — ABNORMAL LOW (ref >60)
GFR calc non Af Amer: 38 mL/min — ABNORMAL LOW (ref >60)
Glucose, Bld: 92 mg/dL (ref 70–99)
Potassium: 4.2 mmol/L (ref 3.5–5.1)
Sodium: 138 mmol/L (ref 135–145)

## 2018-09-10 LAB — LIPASE, BLOOD: Lipase: 40 U/L (ref 11–51)

## 2018-09-10 MED ORDER — ACETAMINOPHEN 325 MG PO TABS
650.0000 mg | ORAL_TABLET | Freq: Once | ORAL | Status: AC
  Administered 2018-09-10: 650 mg via ORAL
  Filled 2018-09-10: qty 2

## 2018-09-10 MED ORDER — SODIUM CHLORIDE 0.9 % IV BOLUS
1000.0000 mL | Freq: Once | INTRAVENOUS | Status: AC
  Administered 2018-09-10: 1000 mL via INTRAVENOUS

## 2018-09-10 MED ORDER — ONDANSETRON HCL 4 MG/2ML IJ SOLN
4.0000 mg | Freq: Once | INTRAMUSCULAR | Status: AC
  Administered 2018-09-10: 4 mg via INTRAVENOUS
  Filled 2018-09-10: qty 2

## 2018-09-10 NOTE — ED Triage Notes (Signed)
Patient here from home with complaints of nausea, vomiting, fatigue. Positive COVID test. Increased weakness last night. AAO x4.

## 2018-09-10 NOTE — Discharge Instructions (Addendum)
You were seen in the ER for generalized weakness, lightheadedness and known COVID-19 infection.  Your kidney function is elevated today, a little bit worse than on 7/19.  We gave you some IV fluids.  We recommended observation to recheck your kidney function and continue hydration however you declined and opted to be discharged [place].  Stable hydrated, drink at least 2 L of clear fluids daily, avoid sugary drinks).  Rest.  Return for worsening symptoms, chest pain, shortness of breath, inability to urinate

## 2018-09-10 NOTE — ED Provider Notes (Signed)
[facility]   CSN: [id number] Arrival date & time: 09/10/18  0459    History   Chief Complaint Chief Complaint  Patient presents with  . Nausea  . Emesis  . COVID +   HPI Jerry Arnold is a 33 y.o. male with history of diabetes, hypertension presents to the ER for evaluation of worsening COVID-19 symptoms.  Patient was tested for COVID-19 on 7/17 and it was positive.  He has had persistent, worsening fevers, chills, generalized weakness, productive cough, body aches, loss of taste and smell, nausea, vomiting.  Over the last couple of days he has felt more weak and has had intermittent lightheadedness with standing up and walking, his lightheadedness resolves when he just sits down and rests.  He has been alternating Tylenol at home as needed for symptoms but no other interventions.  He has tried to stay hydrated but states he has had intermittent vomiting.  He denies any headache, congestion, rhinorrhea, sore throat, hematemesis, chest pain, shortness of breath, abdominal pain, diarrhea, constipation.  States he flew to [place] 1 week ago and 1 day after he returned to [place] he started feeling sick.    HPI  Past Medical History:  Diagnosis Date  . Osteomyelitis (HCC) 04/11/2017   with "second toe on right foot" injury x 2-3 weeks (04/11/2017)  . Type II diabetes mellitus [facility](HCC)     Patient Active Problem List   Diagnosis Date Noted  . Hypertension 05/28/2017  . Amputated toe of right foot (HCC) 04/24/2017  . AKI (acute kidney injury) (HCC) 04/16/2017  . Nausea and vomiting 04/16/2017  . Cellulitis of foot   . Acute osteomyelitis of toe, right (HCC) 04/12/2017  . Gangrene of toe of right foot (HCC) 04/12/2017  . Anemia 04/11/2017  . Diabetes (HCC) 04/11/2017    Past Surgical History:  Procedure Laterality Date  . AMPUTATION Right 04/13/2017   Procedure: SECOND FOOT RAY AMPUTATION;  Surgeon: Nadara Mustarduda, Marcus V, MD;  [facility];  Service: Orthopedics;  Laterality: Right;  . NO PAST SURGERIES          Home Medications    Prior to Admission medications   Medication Sig Start Date End Date Taking? Authorizing Provider  acetaminophen (TYLENOL) 500 MG tablet Take 1 tablet (500 mg total) by mouth every 6 (six) hours as needed. Patient taking differently: Take 500 mg by mouth every 6 (six) hours as needed for moderate pain.  09/07/18  Yes Fayrene Helperran, [place], PA-C  insulin NPH-regular Human (70-30) 100 UNIT/ML injection Inject 23 Units into the skin 2 (two) times daily with a meal. 05/20/18  Yes Newlin, Enobong, MD  isosorbide mononitrate (IMDUR) 60 MG 24 hr tablet Take 1 tablet (60 mg total) by mouth daily. 05/20/18  Yes Hoy RegisterNewlin, Enobong, MD  lisinopril-hydrochlorothiazide (PRINZIDE,ZESTORETIC) 20-25 MG tablet Take 1 tablet by mouth daily. 01/15/18  Yes Hoy RegisterNewlin, Enobong, MD  promethazine (PHENERGAN) 25 MG tablet Take 1 tablet (25 mg total) by mouth every 6 (six) hours as needed for nausea. 09/07/18  Yes Fayrene Helperran, [place], PA-C  acyclovir (ZOVIRAX) 400 MG tablet Take 1 tablet (400 mg total) by mouth 4 (four) times daily. Patient not taking: Reported on 09/10/2018 07/03/18   Fayrene Helperran, [place], PA-C  Insulin Pen Needle (TRUEPLUS PEN NEEDLES) 32G X 4 MM MISC Use as directed to inject Victoza. 01/22/18   Hoy RegisterNewlin, Enobong, MD  Insulin Syringe-Needle U-100 (TRUEPLUS INSULIN SYRINGE) 31G X 5/16" 0.3 ML MISC Use as directed to administer insulin twice  daily 12/31/17   Hoy RegisterNewlin, Enobong, MD  liraglutide (VICTOZA) 18 MG/3ML SOPN Inject 0.1 mLs (0.6 mg total) into the skin daily with breakfast. Patient not taking: Reported on 09/10/2018 05/20/18   Hoy RegisterNewlin, Enobong, MD  metoCLOPramide (REGLAN) 5 MG tablet Take 1 tablet (5 mg total) by mouth every 6 (six) hours as needed for nausea. Patient not taking: Reported on 11/27/2017 04/24/17 09/07/18  Vivianne MasterNoel, Tiffany S, PA-C    Family History Family History  Problem Relation Age of Onset  . Hypertension Mother   .  Hypertension Maternal Grandmother   . Hypertension Paternal Grandmother     Social History Social History   Tobacco Use  . Smoking status: Never Smoker  . Smokeless tobacco: Never Used  Substance Use Topics  . Alcohol use: Yes    Comment: 04/11/2017 "might have a few drinks/year"  . Drug use: No     Allergies   Patient has no known allergies.   Review of Systems Review of Systems  Constitutional: Positive for appetite change, chills and fatigue.  HENT:       Loss of taste, loss of smell  Respiratory: Positive for cough.   Gastrointestinal: Positive for nausea and vomiting.  Neurological: Positive for light-headedness.  All other systems reviewed and are negative.    Physical Exam Updated Vital Signs BP 120/80   Pulse 87   Temp 98.6 F (37 C) (Oral)   Resp 18   Ht 6\' 1"  (1.854 m)   Wt 120.7 kg   SpO2 95%   BMI 35.09 kg/m   Physical Exam Vitals signs and nursing note reviewed.  Constitutional:      Appearance: He is well-developed.     Comments: Looks tired but nontoxic  HENT:     Head: Normocephalic and atraumatic.     Nose: Nose normal.     Mouth/Throat:     Mouth: Mucous membranes are dry.     Comments: Dry lips and dry mucous membranes Eyes:     Conjunctiva/sclera: Conjunctivae normal.  Neck:     Musculoskeletal: Normal range of motion.  Cardiovascular:     Rate and Rhythm: Normal rate and regular rhythm.     Heart sounds: Normal heart sounds.  Pulmonary:     Effort: Pulmonary effort is normal.     Comments: Diminished lung sounds to lower lobes anteriorly, clear posteriorly.  No wheezing, crackles.  Speaking in full sentences.  RR at rest is in the mid 20s. Abdominal:     General: Bowel sounds are normal.     Palpations: Abdomen is soft.     Tenderness: There is no abdominal tenderness.     Comments: No G/R/R. No suprapubic or CVA tenderness. Negative Murphy's and McBurney's  Musculoskeletal: Normal range of motion.  Skin:    General: Skin  is warm and dry.     Capillary Refill: Capillary refill takes less than 2 seconds.  Neurological:     Mental Status: He is alert.  Psychiatric:        Behavior: Behavior normal.      ED Treatments / Results  Labs (all labs ordered are listed, but only abnormal results are displayed) Labs Reviewed  CBC WITH DIFFERENTIAL/PLATELET - Abnormal; Notable for the following components:      Result Value   Hemoglobin 12.3 (*)    HCT 38.3 (*)    All other components within normal limits  COMPREHENSIVE METABOLIC PANEL - Abnormal; Notable for the following components:   Glucose, Bld 149 (*)  BUN 30 (*)    Creatinine, Ser 2.43 (*)    Calcium 8.4 (*)    Total Protein 8.2 (*)    GFR calc non Af Amer 34 (*)    GFR calc Af Amer 39 (*)    All other components within normal limits  BASIC METABOLIC PANEL - Abnormal; Notable for the following components:   BUN 30 (*)    Creatinine, Ser 2.22 (*)    Calcium 8.1 (*)    GFR calc non Af Amer 38 (*)    GFR calc Af Amer 44 (*)    All other components within normal limits  LIPASE, BLOOD  URINALYSIS, ROUTINE W REFLEX MICROSCOPIC    EKG EKG Interpretation  Date/Time:  Wednesday September 10 2018 09:27:31 EDT Ventricular Rate:  87 PR Interval:    QRS Duration: 93 QT Interval:  342 QTC Calculation: 412 R Axis:   57 Text Interpretation:  Sinus rhythm RSR' in V1 or V2, probably normal variant No acute changes No significant change since last tracing Confirmed by Varney Biles [phone]) on 09/10/2018 11:59:14 AM   Radiology Dg Chest Portable 1 View  Result Date: 09/10/2018 CLINICAL DATA:  Lightheadedness. COVID-19 positive. Nausea and vomiting. EXAM: PORTABLE CHEST 1 VIEW COMPARISON:  April 18, 2016 FINDINGS: Mild opacity in the right base has platelike components. The heart, hila, mediastinum, lungs, and pleura are otherwise unremarkable. IMPRESSION: Mild opacity in the right base has platelike components suggesting possible atelectasis. Early  infiltrate not excluded. Recommend follow-up to resolution. Electronically Signed   By: Dorise Bullion III M.D   On: 09/10/2018 08:52    Procedures .Critical Care Performed by: Kinnie Feil, PA-C Authorized by: Kinnie Feil, PA-C   Critical care provider statement:    Critical care time (minutes):  45   Critical care was necessary to treat or prevent imminent or life-threatening deterioration of the following conditions:  Renal failure   Critical care was time spent personally by me on the following activities:  Discussions with consultants, evaluation of patient's response to treatment, examination of patient, ordering and performing treatments and interventions, ordering and review of laboratory studies, ordering and review of radiographic studies, pulse oximetry, re-evaluation of patient's condition, obtaining history from patient or surrogate, review of old charts and development of treatment plan with patient or surrogate   I assumed direction of critical care for this patient from another provider in my specialty: no     (including critical care time)  Medications Ordered in ED Medications  acetaminophen (TYLENOL) tablet 650 mg (650 mg Oral Given 09/10/18 0538)  ondansetron (ZOFRAN) injection 4 mg (4 mg Intravenous Given 09/10/18 0805)  sodium chloride 0.9 % bolus 1,000 mL (0 mLs Intravenous Stopped 09/10/18 1100)     Initial Impression / Assessment and Plan / ED Course  I have reviewed the triage vital signs and the nursing notes.  Pertinent labs & imaging results that were available during my care of the patient were reviewed by me and considered in my medical decision making (see chart for details).  Clinical Course as of Sep 09 1344  Wed Sep 10, 2018  0754 Creatinine(!): 2.43 [CG]  0754 GFR, Est African American(!): 39 [CG]  0754 Pt ambulated in room with pulse ox for 5 min, lowets SpO2 93%, RR 38-42 during walking, resolved to >95% at rest and RR in the mid 20s  at rest after walking. Reported light headedness   [CG]  0907 IMPRESSION: Mild opacity in the right base  has platelike components suggesting possible atelectasis. Early infiltrate not excluded. Recommend follow-up to resolution.    DG Chest Portable 1 View [CG]  1235 Creatinine(!): 2.22 [CG]    Clinical Course User Index [CG] Liberty HandyGibbons, Lizzete Gough J, PA-C   Pt here with worsening COVID symptoms including fever, chills, cough, body aches, nausea, vomiting, light headedness on exertion.   Pt has mild increased work of breathing on exertion including tachypnea 35-40s and SpO2 93%.  He reports light headedness but no CP, SOB.   Work up reviewed by me reveals AKI creatinine 2.43, his baseline is around 1.4-1.6.  CXR shows atelectasis vs early infiltrate in RLL, favor atelectasis.    Pt given IVF in ER, he felt slightly better. VS stable.   Recommended admission for AKI, IVF, and observation of increased work of breathing. Pt declined this but was amenable to IVF and re-check creatinine.   1345: Creatinine 2.34 > 2.22 after 1 L IVF.  Concern to give large IVF boluses given transient drop in SpO2 and COVID status.  Discussed again recommendation for admission, patient called his aunt but ultimately declined and left AMA. Risks and benefits of admission and leaving AMA discussed at length. Patient verbally reiterated reasons for admission and risks explained, appeared to have good understanding of risks of leaving AMA. He has sound mind to make his own decisions.  To the best of my ability I attempted to convince patient to stay but he ultimately declined.   Patient discharged AMA with recommendation to stay hydrated and f/u with PCP for creatinine recheck in 48-72 hours. Strict return precautions given. Discussed with EDP.    Final Clinical Impressions(s) / ED Diagnoses   Final diagnoses:  COVID-19 virus infection  Acute kidney injury [facility](HCC)    ED Discharge Orders    None       Liberty HandyGibbons,  Emelynn Rance J, PA-C 09/10/18 1352    Derwood KaplanNanavati, Ankit, MD 09/14/18 [phone]

## 2018-09-10 NOTE — ED Notes (Signed)
Pt states he is unable to provide urine sample at this time

## 2018-09-11 ENCOUNTER — Telehealth: Payer: Self-pay | Admitting: Family Medicine

## 2018-09-11 ENCOUNTER — Inpatient Hospital Stay (HOSPITAL_COMMUNITY)
Admission: EM | Admit: 2018-09-11 | Discharge: 2018-09-15 | DRG: 178 | Disposition: A | Discharge status: Home / Self Care | Payer: HRSA Program | Attending: Internal Medicine | Admitting: Internal Medicine

## 2018-09-11 ENCOUNTER — Other Ambulatory Visit: Payer: Self-pay

## 2018-09-11 ENCOUNTER — Encounter (HOSPITAL_COMMUNITY): Payer: Self-pay | Admitting: Emergency Medicine

## 2018-09-11 DIAGNOSIS — IMO0001 Reserved for inherently not codable concepts without codable children: Secondary | ICD-10-CM

## 2018-09-11 DIAGNOSIS — T380X5A Adverse effect of glucocorticoids and synthetic analogues, initial encounter: Secondary | ICD-10-CM | POA: Diagnosis present

## 2018-09-11 DIAGNOSIS — E860 Dehydration: Secondary | ICD-10-CM | POA: Diagnosis present

## 2018-09-11 DIAGNOSIS — N183 Chronic kidney disease, stage 3 (moderate): Secondary | ICD-10-CM | POA: Diagnosis present

## 2018-09-11 DIAGNOSIS — Z794 Long term (current) use of insulin: Secondary | ICD-10-CM

## 2018-09-11 DIAGNOSIS — E1165 Type 2 diabetes mellitus with hyperglycemia: Secondary | ICD-10-CM | POA: Diagnosis present

## 2018-09-11 DIAGNOSIS — E1169 Type 2 diabetes mellitus with other specified complication: Secondary | ICD-10-CM

## 2018-09-11 DIAGNOSIS — I129 Hypertensive chronic kidney disease with stage 1 through stage 4 chronic kidney disease, or unspecified chronic kidney disease: Secondary | ICD-10-CM | POA: Diagnosis present

## 2018-09-11 DIAGNOSIS — Z79899 Other long term (current) drug therapy: Secondary | ICD-10-CM

## 2018-09-11 DIAGNOSIS — U071 COVID-19: Secondary | ICD-10-CM | POA: Diagnosis not present

## 2018-09-11 DIAGNOSIS — Z6835 Body mass index (BMI) 35.0-35.9, adult: Secondary | ICD-10-CM

## 2018-09-11 DIAGNOSIS — N179 Acute kidney failure, unspecified: Secondary | ICD-10-CM | POA: Diagnosis present

## 2018-09-11 DIAGNOSIS — Z833 Family history of diabetes mellitus: Secondary | ICD-10-CM

## 2018-09-11 DIAGNOSIS — R112 Nausea with vomiting, unspecified: Secondary | ICD-10-CM

## 2018-09-11 DIAGNOSIS — E1122 Type 2 diabetes mellitus with diabetic chronic kidney disease: Secondary | ICD-10-CM | POA: Diagnosis present

## 2018-09-11 DIAGNOSIS — E872 Acidosis: Secondary | ICD-10-CM | POA: Diagnosis present

## 2018-09-11 DIAGNOSIS — Z89421 Acquired absence of other right toe(s): Secondary | ICD-10-CM

## 2018-09-11 HISTORY — DX: Essential (primary) hypertension: I10

## 2018-09-11 LAB — COMPREHENSIVE METABOLIC PANEL
ALT: 22 U/L (ref 0–44)
AST: 31 U/L (ref 15–41)
Albumin: 3.6 g/dL (ref 3.5–5.0)
Alkaline Phosphatase: 75 U/L (ref 38–126)
Anion gap: 15 (ref 5–15)
BUN: 29 mg/dL — ABNORMAL HIGH (ref 6–20)
CO2: 20 mmol/L — ABNORMAL LOW (ref 22–32)
Calcium: 8.4 mg/dL — ABNORMAL LOW (ref 8.9–10.3)
Chloride: 101 mmol/L (ref 98–111)
Creatinine, Ser: 2.2 mg/dL — ABNORMAL HIGH (ref 0.61–1.24)
GFR calc Af Amer: 44 mL/min — ABNORMAL LOW (ref >60)
GFR calc non Af Amer: 38 mL/min — ABNORMAL LOW (ref >60)
Glucose, Bld: 142 mg/dL — ABNORMAL HIGH (ref 70–99)
Potassium: 4.2 mmol/L (ref 3.5–5.1)
Sodium: 136 mmol/L (ref 135–145)
Total Bilirubin: 0.4 mg/dL (ref 0.3–1.2)
Total Protein: 7.9 g/dL (ref 6.5–8.1)

## 2018-09-11 LAB — LACTATE DEHYDROGENASE
LDH: 318 U/L — ABNORMAL HIGH (ref 98–192)
LDH: 263 U/L — ABNORMAL HIGH (ref 98–192)

## 2018-09-11 LAB — CBC WITH DIFFERENTIAL/PLATELET
Abs Immature Granulocytes: 0.02 10*3/uL (ref 0.00–0.07)
Basophils Absolute: 0 10*3/uL (ref 0.0–0.1)
Basophils Relative: 0 %
Eosinophils Absolute: 0 10*3/uL (ref 0.0–0.5)
Eosinophils Relative: 0 %
HCT: 39.9 % (ref 39.0–52.0)
Hemoglobin: 12.7 g/dL — ABNORMAL LOW (ref 13.0–17.0)
Immature Granulocytes: 0 %
Lymphocytes Relative: 31 %
Lymphs Abs: 1.5 10*3/uL (ref 0.7–4.0)
MCH: 28 pg (ref 26.0–34.0)
MCHC: 31.8 g/dL (ref 30.0–36.0)
MCV: 88.1 fL (ref 80.0–100.0)
Monocytes Absolute: 0.4 10*3/uL (ref 0.1–1.0)
Monocytes Relative: 8 %
Neutro Abs: 2.9 10*3/uL (ref 1.7–7.7)
Neutrophils Relative %: 61 %
Platelets: 173 10*3/uL (ref 150–400)
RBC: 4.53 MIL/uL (ref 4.22–5.81)
RDW: 13.5 % (ref 11.5–15.5)
WBC: 4.8 10*3/uL (ref 4.0–10.5)
nRBC: 0 % (ref 0.0–0.2)

## 2018-09-11 LAB — PROCALCITONIN
Procalcitonin: <0.1 ng/mL
Procalcitonin: <0.1 ng/mL

## 2018-09-11 LAB — CREATININE, SERUM
Creatinine, Ser: 2.26 mg/dL — ABNORMAL HIGH (ref 0.61–1.24)
GFR calc Af Amer: 43 mL/min — ABNORMAL LOW (ref >60)
GFR calc non Af Amer: 37 mL/min — ABNORMAL LOW (ref >60)

## 2018-09-11 LAB — FERRITIN
Ferritin: 561 ng/mL — ABNORMAL HIGH (ref 24–336)
Ferritin: 488 ng/mL — ABNORMAL HIGH (ref 24–336)

## 2018-09-11 LAB — LACTIC ACID, PLASMA: Lactic Acid, Venous: 0.8 mmol/L (ref 0.5–1.9)

## 2018-09-11 LAB — C-REACTIVE PROTEIN
CRP: 9.4 mg/dL — ABNORMAL HIGH (ref <1.0)
CRP: 10 mg/dL — ABNORMAL HIGH (ref <1.0)

## 2018-09-11 LAB — TRIGLYCERIDES: Triglycerides: 131 mg/dL (ref <150)

## 2018-09-11 MED ORDER — INSULIN ASPART 100 UNIT/ML ~~LOC~~ SOLN
0.0000 [IU] | Freq: Every day | SUBCUTANEOUS | Status: DC
  Administered 2018-09-13: 4 [IU] via SUBCUTANEOUS
  Administered 2018-09-14: 5 [IU] via SUBCUTANEOUS
  Filled 2018-09-11: qty 0.05

## 2018-09-11 MED ORDER — SACCHAROMYCES BOULARDII 250 MG PO CAPS
250.0000 mg | ORAL_CAPSULE | Freq: Two times a day (BID) | ORAL | Status: DC
  Administered 2018-09-12 – 2018-09-15 (×7): 250 mg via ORAL
  Filled 2018-09-11 (×11): qty 1

## 2018-09-11 MED ORDER — SODIUM CHLORIDE 0.9 % IV SOLN
1.0000 g | Freq: Every day | INTRAVENOUS | Status: DC
  Administered 2018-09-11 – 2018-09-12 (×2): 1 g via INTRAVENOUS
  Filled 2018-09-11 (×2): qty 10

## 2018-09-11 MED ORDER — HEPARIN SODIUM (PORCINE) 5000 UNIT/ML IJ SOLN
5000.0000 [IU] | Freq: Three times a day (TID) | INTRAMUSCULAR | Status: DC
  Administered 2018-09-12 – 2018-09-15 (×9): 5000 [IU] via SUBCUTANEOUS
  Filled 2018-09-11 (×11): qty 1

## 2018-09-11 MED ORDER — SODIUM CHLORIDE 0.9 % IV SOLN
500.0000 mg | INTRAVENOUS | Status: DC
  Administered 2018-09-12 – 2018-09-13 (×2): 500 mg via INTRAVENOUS
  Filled 2018-09-11 (×2): qty 500

## 2018-09-11 MED ORDER — ONDANSETRON HCL 4 MG/2ML IJ SOLN
4.0000 mg | Freq: Once | INTRAMUSCULAR | Status: AC
  Administered 2018-09-11: 4 mg via INTRAVENOUS
  Filled 2018-09-11: qty 2

## 2018-09-11 MED ORDER — SODIUM CHLORIDE 0.9 % IV BOLUS
1000.0000 mL | Freq: Once | INTRAVENOUS | Status: AC
  Administered 2018-09-11: 21:00:00 1000 mL via INTRAVENOUS

## 2018-09-11 MED ORDER — INSULIN ASPART 100 UNIT/ML ~~LOC~~ SOLN
0.0000 [IU] | Freq: Three times a day (TID) | SUBCUTANEOUS | Status: DC
  Administered 2018-09-13: 5 [IU] via SUBCUTANEOUS
  Administered 2018-09-13: 17:00:00 2 [IU] via SUBCUTANEOUS
  Administered 2018-09-13: 5 [IU] via SUBCUTANEOUS
  Administered 2018-09-14: 2 [IU] via SUBCUTANEOUS
  Administered 2018-09-14: 3 [IU] via SUBCUTANEOUS
  Administered 2018-09-14: 5 [IU] via SUBCUTANEOUS
  Administered 2018-09-15: 08:00:00 7 [IU] via SUBCUTANEOUS
  Administered 2018-09-15: 3 [IU] via SUBCUTANEOUS
  Filled 2018-09-11: qty 0.09

## 2018-09-11 MED ORDER — INSULIN ASPART 100 UNIT/ML ~~LOC~~ SOLN: 0.0000 [IU] | Freq: Every day | SUBCUTANEOUS | Status: DC

## 2018-09-11 MED ORDER — INSULIN ASPART 100 UNIT/ML ~~LOC~~ SOLN: 0.0000 [IU] | Freq: Three times a day (TID) | SUBCUTANEOUS | Status: DC

## 2018-09-11 MED ORDER — SODIUM CHLORIDE 0.9 % IV SOLN
INTRAVENOUS | Status: AC
  Administered 2018-09-11 – 2018-09-12 (×2): via INTRAVENOUS

## 2018-09-11 NOTE — ED Triage Notes (Addendum)
Pt arriving via [facility] for generalized weakness and nausea. Pt seen yesterday for same. Left AMA prior to admission. Pt tested positive for Covid on Sunday. No fever.

## 2018-09-11 NOTE — ED Provider Notes (Signed)
[facility]-EMERGENCY DEPT Provider Note   CSN: [id number] Arrival date & time: 09/11/18  2048    History   Chief Complaint Chief Complaint  Patient presents with  . Weakness    HPI Jerry Arnold is a 33 y.o. male.     HPI   33 year old male with a history of type 2 diabetes, hypertension, osteomyelitis, recent diagnosis of COVID-19 infection on July 19, evaluation yesterday in the emergency department for generalized weakness, vomiting, found to have AKI and offered admission, presents again with concern for continuing severe generalized weakness, fatigue, nausea and vomiting not relieved by nausea medications.  He has had continuing fever.  Reports chills, generalized weakness, productive cough, body aches, loss of taste and smell.  Reports he is been unable to keep anything down due to nausea and vomiting, and is also had no appetite.  Denies any diarrhea denies headache, sore throat, chest pain, shortness of breath, abdominal pain, diarrhea or constipation.  Past Medical History:  Diagnosis Date  . Osteomyelitis (HCC) 04/11/2017   with "second toe on right foot" injury x 2-3 weeks (04/11/2017)  . Type II diabetes mellitus [facility](HCC)     Patient Active Problem List   Diagnosis Date Noted  . Hypertension 05/28/2017  . Amputated toe of right foot (HCC) 04/24/2017  . AKI (acute kidney injury) (HCC) 04/16/2017  . Nausea and vomiting 04/16/2017  . Cellulitis of foot   . Acute osteomyelitis of toe, right (HCC) 04/12/2017  . Gangrene of toe of right foot (HCC) 04/12/2017  . Anemia 04/11/2017  . Diabetes (HCC) 04/11/2017    Past Surgical History:  Procedure Laterality Date  . AMPUTATION Right 04/13/2017   Procedure: SECOND FOOT RAY AMPUTATION;  Surgeon: Nadara Mustarduda, Marcus V, MD;  [facility];  Service: Orthopedics;  Laterality: Right;  . NO PAST SURGERIES          Home Medications    Prior to Admission medications   Medication Sig Start Date End Date  Taking? Authorizing Provider  acetaminophen (TYLENOL) 500 MG tablet Take 1 tablet (500 mg total) by mouth every 6 (six) hours as needed. Patient taking differently: Take 500 mg by mouth every 6 (six) hours as needed for moderate pain.  09/07/18  Yes Fayrene Helperran, Bowie, PA-C  insulin NPH-regular Human (70-30) 100 UNIT/ML injection Inject 23 Units into the skin 2 (two) times daily with a meal. 05/20/18  Yes Newlin, Enobong, MD  isosorbide mononitrate (IMDUR) 60 MG 24 hr tablet Take 1 tablet (60 mg total) by mouth daily. 05/20/18  Yes Hoy RegisterNewlin, Enobong, MD  lisinopril-hydrochlorothiazide (PRINZIDE,ZESTORETIC) 20-25 MG tablet Take 1 tablet by mouth daily. 01/15/18  Yes Hoy RegisterNewlin, Enobong, MD  promethazine (PHENERGAN) 25 MG tablet Take 1 tablet (25 mg total) by mouth every 6 (six) hours as needed for nausea. 09/07/18  Yes Fayrene Helperran, [place], PA-C  acyclovir (ZOVIRAX) 400 MG tablet Take 1 tablet (400 mg total) by mouth 4 (four) times daily. Patient not taking: Reported on 09/10/2018 07/03/18   Fayrene Helperran, [place], PA-C  Insulin Pen Needle (TRUEPLUS PEN NEEDLES) 32G X 4 MM MISC Use as directed to inject Victoza. 01/22/18   Hoy RegisterNewlin, Enobong, MD  Insulin Syringe-Needle U-100 (TRUEPLUS INSULIN SYRINGE) 31G X 5/16" 0.3 ML MISC Use as directed to administer insulin twice daily 12/31/17   Hoy RegisterNewlin, Enobong, MD  liraglutide (VICTOZA) 18 MG/3ML SOPN Inject 0.1 mLs (0.6 mg total) into the skin daily with breakfast. Patient not taking: Reported on 09/10/2018 05/20/18   Hoy RegisterNewlin, Enobong, MD  metoCLOPramide (REGLAN)  5 MG tablet Take 1 tablet (5 mg total) by mouth every 6 (six) hours as needed for nausea. Patient not taking: Reported on 11/27/2017 04/24/17 09/07/18  Vivianne MasterNoel, Tiffany S, PA-C    Family History Family History  Problem Relation Age of Onset  . Hypertension Mother   . Hypertension Maternal Grandmother   . Hypertension Paternal Grandmother     Social History Social History   Tobacco Use  . Smoking status: Never Smoker  . Smokeless tobacco:  Never Used  Substance Use Topics  . Alcohol use: Yes    Comment: 04/11/2017 "might have a few drinks/year"  . Drug use: No     Allergies   Patient has no known allergies.   Review of Systems Review of Systems  Constitutional: Positive for activity change, appetite change, chills, fatigue and fever.  HENT: Negative for sore throat.   Eyes: Negative for visual disturbance.  Respiratory: Positive for cough. Negative for shortness of breath.   Cardiovascular: Negative for chest pain.  Gastrointestinal: Positive for nausea and vomiting. Negative for abdominal pain, constipation and diarrhea.  Genitourinary: Negative for difficulty urinating.  Musculoskeletal: Positive for myalgias. Negative for back pain and neck stiffness.  Skin: Negative for rash.  Neurological: Negative for syncope and headaches.     Physical Exam Updated Vital Signs BP 130/86   Pulse 95   Temp 100.2 F (37.9 C) (Oral)   Resp (!) 23   SpO2 99%   Physical Exam Vitals signs and nursing note reviewed.  Constitutional:      General: He is not in acute distress.    Appearance: He is well-developed. He is not diaphoretic.  HENT:     Head: Normocephalic and atraumatic.  Eyes:     Conjunctiva/sclera: Conjunctivae normal.  Neck:     Musculoskeletal: Normal range of motion.  Cardiovascular:     Rate and Rhythm: Normal rate and regular rhythm.  Pulmonary:     Effort: Pulmonary effort is normal. No respiratory distress.     Comments: Mild tachypnea Abdominal:     General: There is no distension.     Palpations: Abdomen is soft.     Tenderness: There is no abdominal tenderness. There is no guarding.  Skin:    General: Skin is warm and dry.  Neurological:     Mental Status: He is alert and oriented to person, place, and time.      ED Treatments / Results  Labs (all labs ordered are listed, but only abnormal results are displayed) Labs Reviewed  CBC WITH DIFFERENTIAL/PLATELET - Abnormal; Notable for  the following components:      Result Value   Hemoglobin 12.7 (*)    All other components within normal limits  COMPREHENSIVE METABOLIC PANEL - Abnormal; Notable for the following components:   CO2 20 (*)    Glucose, Bld 142 (*)    BUN 29 (*)    Creatinine, Ser 2.20 (*)    Calcium 8.4 (*)    GFR calc non Af Amer 38 (*)    GFR calc Af Amer 44 (*)    All other components within normal limits  LACTATE DEHYDROGENASE - Abnormal; Notable for the following components:   LDH 318 (*)    All other components within normal limits  CULTURE, BLOOD (ROUTINE X 2)  CULTURE, BLOOD (ROUTINE X 2)  TRIGLYCERIDES  LACTIC ACID, PLASMA  LACTIC ACID, PLASMA  PROCALCITONIN  FERRITIN  C-REACTIVE PROTEIN  D-DIMER, QUANTITATIVE (NOT AT [facility])  FIBRINOGEN  EKG None  Radiology Dg Chest Portable 1 View  Result Date: 09/10/2018 CLINICAL DATA:  Lightheadedness. COVID-19 positive. Nausea and vomiting. EXAM: PORTABLE CHEST 1 VIEW COMPARISON:  April 18, 2016 FINDINGS: Mild opacity in the right base has platelike components. The heart, hila, mediastinum, lungs, and pleura are otherwise unremarkable. IMPRESSION: Mild opacity in the right base has platelike components suggesting possible atelectasis. Early infiltrate not excluded. Recommend follow-up to resolution. Electronically Signed   By: Dorise Bullion III M.D   On: 09/10/2018 08:52    Procedures Procedures (including critical care time)  Medications Ordered in ED Medications  sodium chloride 0.9 % bolus 1,000 mL (1,000 mLs Intravenous New Bag/Given 09/11/18 2128)  ondansetron (ZOFRAN) injection 4 mg (4 mg Intravenous Given 09/11/18 2135)     Initial Impression / Assessment and Plan / ED Course  I have reviewed the triage vital signs and the nursing notes.  Pertinent labs & imaging results that were available during my care of the patient were reviewed by me and considered in my medical decision making (see chart for details).         33 year old male with a history of type 2 diabetes, hypertension, osteomyelitis, recent diagnosis of COVID-19 infection on July 19, evaluation yesterday in the emergency department for generalized weakness, vomiting, found to have AKI and offered admission, presents again with concern for continuing severe generalized weakness, fatigue, nausea and vomiting not relieved by nausea medications.  Yesterday at time of my evaluation he was noted to have tachypnea, as well as a creatinine of 2.4 from previous baseline of 1.5.  He was offered admission, but left AGAINST MEDICAL ADVICE.  Today, he presents as he has been unable to eat or drink at home with continued vomiting despite nausea medicine.  Labs today show creatinine of 2.2 without other significant electrolyte abnormalities.  His temperature today is 100.2 with blood pressures of 101/69, normal oxygen saturation, and mild tachypnea to 25.  Given his inability to tolerate p.o. at home, and worsened renal function, will admit for continued hydration, nausea control and monitoring in the setting of COVID-19 infection.    Jerry Arnold was evaluated in Emergency Department on 09/11/2018 for the symptoms described in the history of present illness. He was evaluated in the context of the global COVID-19 pandemic, which necessitated consideration that the patient might be at risk for infection with the SARS-CoV-2 virus that causes COVID-19. Institutional protocols and algorithms that pertain to the evaluation of patients at risk for COVID-19 are in a state of rapid change based on information released by regulatory bodies including the CDC and federal and state organizations. These policies and algorithms were followed during the patient's care in the ED.   Final Clinical Impressions(s) / ED Diagnoses   Final diagnoses:  Intractable vomiting with nausea, unspecified vomiting type  Dehydration  AKI (acute kidney injury) ([place])  COVID-19 virus infection     ED Discharge Orders    None       Gareth Morgan, MD 09/11/18 2221

## 2018-09-11 NOTE — Telephone Encounter (Signed)
Patient's Aunt  Mrs  Odella Aquas called because he went to [facility] last Sunday and got pt positive covid 19 and they give him a Nausea medicine that is getting him worse  And since he is diabetic she wants to know if the Dr Margarita Rana can prescribe something. Please, call the Aunt [phone]. Thank you

## 2018-09-11 NOTE — H&P (Addendum)
TRH H&P    Patient Demographics:    Jerry Arnold, is a 33 y.o. male  MRN: [id number]  DOB - 02-07-1986  Admit Date - 09/11/2018  Referring MD/NP/PA: Gareth Morgan  Outpatient Primary MD for the patient is Charlott Rakes, MD  Patient coming from:  home  Chief complaint-  N/v, diarrhea, Covid -19 positive   HPI:    Jerry Arnold  is a 33 y.o. male, w Dm2, CKD stage 3, h/o osteomyelitis, apparently presents with generalized weakness.  Pt notes slight cough w yellow sputum x 1 week, and n/v x1 week.  Pt has had subjective low grade fever.  Pt denies cp, palp, sob, abd pain, diarrhea, brbpr, black stool, dysuria, hematuria.  Pt notes that he went to [place] and might have been exposed to Covid there.  Pt presented due to weakness, n/v, cough.   In ED,  T 100.2 P 94  R 25  Bp 114/69  Pox 97% on RA  CXR IMPRESSION: Mild opacity in the right base has platelike components suggesting possible atelectasis. Early infiltrate not excluded. Recommend follow-up to resolution.  Wbc 4.8, Hgb 12.7, Plt 173 Na 136, K 4.2, Bun 29, Creatinine 2.20  Ast 31, Alt 22,  Glucose 142  Ferritin 561,  Crp 9.4  Pt will be admitted for Covid -19 positive, and possible early CAP, and n/v, Acute renal failure.       Review of systems:    In addition to the HPI above,   , No Headache, No changes with Vision or hearing, No problems swallowing food or Liquids, No Chest pain, Cough or Shortness of Breath, No Abdominal pain, bowel movements are regular, No Blood in stool or Urine, No dysuria, No new skin rashes or bruises, No new joints pains-aches,  No new weakness, tingling, numbness in any extremity, No recent weight gain or loss, No polyuria, polydypsia or polyphagia, No significant Mental Stressors.  All other systems reviewed and are negative.    Past History of the following :    Past Medical  History:  Diagnosis Date  . Hypertension   . Osteomyelitis ([place]) 04/11/2017   with "second toe on right foot" injury x 2-3 weeks (04/11/2017)  . Type II diabetes mellitus ([place])       Past Surgical History:  Procedure Laterality Date  . AMPUTATION Right 04/13/2017   Procedure: SECOND FOOT RAY AMPUTATION;  Surgeon: Newt Minion, MD;  Location: [facility];  Service: Orthopedics;  Laterality: Right;  . NO PAST SURGERIES        Social History:      Social History   Tobacco Use  . Smoking status: Never Smoker  . Smokeless tobacco: Never Used  Substance Use Topics  . Alcohol use: Yes    Comment: 04/11/2017 "might have a few drinks/year"       Family History :     Family History  Problem Relation Age of Onset  . Hypertension Mother   . Diabetes Mother   . Hypertension Maternal Grandmother   . Hypertension Paternal  Grandmother        Home Medications:   Prior to Admission medications   Medication Sig Start Date End Date Taking? Authorizing Provider  acetaminophen (TYLENOL) 500 MG tablet Take 1 tablet (500 mg total) by mouth every 6 (six) hours as needed. Patient taking differently: Take 500 mg by mouth every 6 (six) hours as needed for moderate pain.  09/07/18  Yes Domenic Moras, PA-C  insulin NPH-regular Human (70-30) 100 UNIT/ML injection Inject 23 Units into the skin 2 (two) times daily with a meal. 05/20/18  Yes Newlin, Enobong, MD  isosorbide mononitrate (IMDUR) 60 MG 24 hr tablet Take 1 tablet (60 mg total) by mouth daily. 05/20/18  Yes Charlott Rakes, MD  lisinopril-hydrochlorothiazide (PRINZIDE,ZESTORETIC) 20-25 MG tablet Take 1 tablet by mouth daily. 01/15/18  Yes Charlott Rakes, MD  promethazine (PHENERGAN) 25 MG tablet Take 1 tablet (25 mg total) by mouth every 6 (six) hours as needed for nausea. 09/07/18  Yes Domenic Moras, PA-C  acyclovir (ZOVIRAX) 400 MG tablet Take 1 tablet (400 mg total) by mouth 4 (four) times daily. Patient not taking: Reported on 09/10/2018 07/03/18    Domenic Moras, PA-C  Insulin Pen Needle (TRUEPLUS PEN NEEDLES) 32G X 4 MM MISC Use as directed to inject Victoza. 01/22/18   Charlott Rakes, MD  Insulin Syringe-Needle U-100 (TRUEPLUS INSULIN SYRINGE) 31G X 5/16" 0.3 ML MISC Use as directed to administer insulin twice daily 12/31/17   Charlott Rakes, MD  liraglutide (VICTOZA) 18 MG/3ML SOPN Inject 0.1 mLs (0.6 mg total) into the skin daily with breakfast. Patient not taking: Reported on 09/10/2018 05/20/18   Charlott Rakes, MD  metoCLOPramide (REGLAN) 5 MG tablet Take 1 tablet (5 mg total) by mouth every 6 (six) hours as needed for nausea. Patient not taking: Reported on 11/27/2017 04/24/17 09/07/18  Brayton Caves, PA-C     Allergies:    No Known Allergies   Physical Exam:   Vitals  Blood pressure 119/76, pulse 95, temperature 100.2 F (37.9 C), temperature source Oral, resp. rate (!) 36, SpO2 99 %.  1.  General: axoxo3  2. Psychiatric: euthymic  3. Neurologic: cn2-12 intact, reflexes 2+ symmetric, diffuse with no clonus, motor 5/5 in all 4 ext  4. HEENMT:  Anicteric, pupils 1.45m symmetric, direct, consensual, near intact  5. Respiratory : + bibasilar crackles left > right, no wheeze  6. Cardiovascular : rrr s1, s2, no m/g/r  7. Gastrointestinal:  Abd: soft, obese, nt, nd, +bs  8. Skin:  Ext: no c/c/e,  No rash  9.Musculoskeletal:  Good ROM,   No adenopathy    Data Review:    CBC Recent Labs  Lab 09/07/18 1336 09/10/18 0525 09/11/18 2122  WBC 4.0 4.6 4.8  HGB 13.4 12.3* 12.7*  HCT 41.2 38.3* 39.9  PLT 193 151 173  MCV 88.4 88.7 88.1  MCH 28.8 28.5 28.0  MCHC 32.5 32.1 31.8  RDW 14.0 13.7 13.5  LYMPHSABS 1.4 1.4 1.5  MONOABS 0.3 0.4 0.4  EOSABS 0.0 0.0 0.0  BASOSABS 0.0 0.0 0.0   ------------------------------------------------------------------------------------------------------------------  Results for orders placed or performed during the hospital encounter of 09/11/18 (from the past 48 hour(s))   CBC with Differential     Status: Abnormal   Collection Time: 09/11/18  9:22 PM  Result Value Ref Range   WBC 4.8 4.0 - 10.5 K/uL   RBC 4.53 4.22 - 5.81 MIL/uL   Hemoglobin 12.7 (L) 13.0 - 17.0 g/dL   HCT 39.9 39.0 - 52.0 %  MCV 88.1 80.0 - 100.0 fL   MCH 28.0 26.0 - 34.0 pg   MCHC 31.8 30.0 - 36.0 g/dL   RDW 13.5 11.5 - 15.5 %   Platelets 173 150 - 400 K/uL   nRBC 0.0 0.0 - 0.2 %   Neutrophils Relative % 61 %   Neutro Abs 2.9 1.7 - 7.7 K/uL   Lymphocytes Relative 31 %   Lymphs Abs 1.5 0.7 - 4.0 K/uL   Monocytes Relative 8 %   Monocytes Absolute 0.4 0.1 - 1.0 K/uL   Eosinophils Relative 0 %   Eosinophils Absolute 0.0 0.0 - 0.5 K/uL   Basophils Relative 0 %   Basophils Absolute 0.0 0.0 - 0.1 K/uL   Immature Granulocytes 0 %   Abs Immature Granulocytes 0.02 0.00 - 0.07 K/uL    Comment: Performed at [facility], [place] [address] [place], [place] [address]  Comprehensive metabolic panel     Status: Abnormal   Collection Time: 09/11/18  9:22 PM  Result Value Ref Range   Sodium 136 135 - 145 mmol/L   Potassium 4.2 3.5 - 5.1 mmol/L   Chloride 101 98 - 111 mmol/L   CO2 20 (L) 22 - 32 mmol/L   Glucose, Bld 142 (H) 70 - 99 mg/dL   BUN 29 (H) 6 - 20 mg/dL   Creatinine, Ser 2.20 (H) 0.61 - 1.24 mg/dL   Calcium 8.4 (L) 8.9 - 10.3 mg/dL   Total Protein 7.9 6.5 - 8.1 g/dL   Albumin 3.6 3.5 - 5.0 g/dL   AST 31 15 - 41 U/L   ALT 22 0 - 44 U/L   Alkaline Phosphatase 75 38 - 126 U/L   Total Bilirubin 0.4 0.3 - 1.2 mg/dL   GFR calc non Af Amer 38 (L) >60 mL/min   GFR calc Af Amer 44 (L) >60 mL/min   Anion gap 15 5 - 15    Comment: Performed at [facility], [place] [address], [place], [place] [address]  Procalcitonin     Status: None   Collection Time: 09/11/18  9:37 PM  Result Value Ref Range   Procalcitonin <0.10 ng/mL    Comment:        Interpretation: PCT (Procalcitonin) <= 0.5 ng/mL: Systemic infection (sepsis) is not likely. Local  bacterial infection is possible. (NOTE)       Sepsis PCT Algorithm           Lower Respiratory Tract                                      Infection PCT Algorithm    ----------------------------     ----------------------------         PCT < 0.25 ng/mL                PCT < 0.10 ng/mL         Strongly encourage             Strongly discourage   discontinuation of antibiotics    initiation of antibiotics    ----------------------------     -----------------------------       PCT 0.25 - 0.50 ng/mL            PCT 0.10 - 0.25 ng/mL               OR       >80% decrease in PCT  Discourage initiation of                                            antibiotics      Encourage discontinuation           of antibiotics    ----------------------------     -----------------------------         PCT >= 0.50 ng/mL              PCT 0.26 - 0.50 ng/mL               AND        <80% decrease in PCT             Encourage initiation of                                             antibiotics       Encourage continuation           of antibiotics    ----------------------------     -----------------------------        PCT >= 0.50 ng/mL                  PCT > 0.50 ng/mL               AND         increase in PCT                  Strongly encourage                                      initiation of antibiotics    Strongly encourage escalation           of antibiotics                                     -----------------------------                                           PCT <= 0.25 ng/mL                                                 OR                                        > 80% decrease in PCT                                     Discontinue / Do not initiate  antibiotics Performed at [facility] [place], [place] [address] [place], [place] [address]   Lactate dehydrogenase     Status: Abnormal   Collection Time: 09/11/18  9:37 PM  Result  Value Ref Range   LDH 318 (H) 98 - 192 U/L    Comment: Performed at [facility], [place] [address] [place], [place] [address]  Ferritin     Status: Abnormal   Collection Time: 09/11/18  9:37 PM  Result Value Ref Range   Ferritin 561 (H) 24 - 336 ng/mL    Comment: Performed at [facility], [place] [address], [place], [place] [address]  Triglycerides     Status: None   Collection Time: 09/11/18  9:37 PM  Result Value Ref Range   Triglycerides 131 <150 mg/dL    Comment: Performed at [facility], [place] [address], [place], [place] [address]  C-reactive protein     Status: Abnormal   Collection Time: 09/11/18  9:37 PM  Result Value Ref Range   CRP 9.4 (H) <1.0 mg/dL    Comment: Performed at [facility], [place] [address], [place], [place] [address]  Lactic acid, plasma     Status: None   Collection Time: 09/11/18  9:58 PM  Result Value Ref Range   Lactic Acid, Venous 0.8 0.5 - 1.9 mmol/L    Comment: Performed at [facility], [place] [address] [place], [place] [address]  Creatinine, serum     Status: Abnormal   Collection Time: 09/11/18 10:49 PM  Result Value Ref Range   Creatinine, Ser 2.26 (H) 0.61 - 1.24 mg/dL   GFR calc non Af Amer 37 (L) >60 mL/min   GFR calc Af Amer 43 (L) >60 mL/min    Comment: Performed at [facility], [place] [address], [place], [place] [address]  Lactate dehydrogenase     Status: Abnormal   Collection Time: 09/11/18 10:49 PM  Result Value Ref Range   LDH 263 (H) 98 - 192 U/L    Comment: Performed at [facility], [place] [address] [place], [place] [address]  Procalcitonin     Status: None   Collection Time: 09/11/18 10:49 PM  Result Value Ref Range   Procalcitonin <0.10 ng/mL    Comment:        Interpretation: PCT (Procalcitonin) <= 0.5 ng/mL: Systemic infection (sepsis) is not likely. Local bacterial infection is possible. (NOTE)        Sepsis PCT Algorithm           Lower Respiratory Tract                                      Infection PCT Algorithm    ----------------------------     ----------------------------         PCT < 0.25 ng/mL                PCT < 0.10 ng/mL         Strongly encourage             Strongly discourage   discontinuation of antibiotics    initiation of antibiotics    ----------------------------     -----------------------------       PCT 0.25 - 0.50 ng/mL            PCT 0.10 - 0.25 ng/mL  OR       >80% decrease in PCT            Discourage initiation of                                            antibiotics      Encourage discontinuation           of antibiotics    ----------------------------     -----------------------------         PCT >= 0.50 ng/mL              PCT 0.26 - 0.50 ng/mL               AND        <80% decrease in PCT             Encourage initiation of                                             antibiotics       Encourage continuation           of antibiotics    ----------------------------     -----------------------------        PCT >= 0.50 ng/mL                  PCT > 0.50 ng/mL               AND         increase in PCT                  Strongly encourage                                      initiation of antibiotics    Strongly encourage escalation           of antibiotics                                     -----------------------------                                           PCT <= 0.25 ng/mL                                                 OR                                        > 80% decrease in PCT                                     Discontinue / Do not initiate                                               antibiotics Performed at [facility], [place] [address] [place], [place] [address]     Chemistries  Recent Labs  Lab 09/07/18 1336 09/10/18 0525 09/10/18 1129 09/11/18 2122 09/11/18 2249  NA 137 137 138 136  --    K 5.1 4.4 4.2 4.2  --   CL 102 103 104 101  --   CO2 '24 23 25 '$ 20*  --   GLUCOSE 138* 149* 92 142*  --   BUN 21* 30* 30* 29*  --   CREATININE 2.21* 2.43* 2.22* 2.20* 2.26*  CALCIUM 9.1 8.4* 8.1* 8.4*  --   AST  --  26  --  31  --   ALT  --  21  --  22  --   ALKPHOS  --  78  --  75  --   BILITOT  --  0.5  --  0.4  --    ------------------------------------------------------------------------------------------------------------------  ------------------------------------------------------------------------------------------------------------------ GFR: Estimated Creatinine Clearance: 63.3 mL/min (A) (by C-G formula based on SCr of 2.26 mg/dL (H)). Liver Function Tests: Recent Labs  Lab 09/10/18 0525 09/11/18 2122  AST 26 31  ALT 21 22  ALKPHOS 78 75  BILITOT 0.5 0.4  PROT 8.2* 7.9  ALBUMIN 3.9 3.6   Recent Labs  Lab 09/10/18 0525  LIPASE 40   No results for input(s): AMMONIA in the last 168 hours. Coagulation Profile: No results for input(s): INR, PROTIME in the last 168 hours. Cardiac Enzymes: No results for input(s): CKTOTAL, CKMB, CKMBINDEX, TROPONINI in the last 168 hours. BNP (last 3 results) No results for input(s): PROBNP in the last 8760 hours. HbA1C: No results for input(s): HGBA1C in the last 72 hours. CBG: Recent Labs  Lab 09/07/18 1403  GLUCAP 106*   Lipid Profile: Recent Labs    09/11/18 2137  TRIG 131   Thyroid Function Tests: No results for input(s): TSH, T4TOTAL, FREET4, T3FREE, THYROIDAB in the last 72 hours. Anemia Panel: Recent Labs    09/11/18 2137  FERRITIN 561*    --------------------------------------------------------------------------------------------------------------- Urine analysis:    Component Value Date/Time   COLORURINE COLORLESS (A) 04/17/2017 0900   APPEARANCEUR CLEAR 04/17/2017 0900   LABSPEC 1.004 (L) 04/17/2017 0900   PHURINE 6.0 04/17/2017 0900   GLUCOSEU NEGATIVE 04/17/2017 0900   HGBUR SMALL (A)  04/17/2017 0900   BILIRUBINUR NEGATIVE 04/17/2017 0900   KETONESUR NEGATIVE 04/17/2017 0900   PROTEINUR NEGATIVE 04/17/2017 0900   NITRITE NEGATIVE 04/17/2017 0900   LEUKOCYTESUR NEGATIVE 04/17/2017 0900      Imaging Results:    Dg Chest Portable 1 View  Result Date: 09/10/2018 CLINICAL DATA:  Lightheadedness. COVID-19 positive. Nausea and vomiting. EXAM: PORTABLE CHEST 1 VIEW COMPARISON:  April 18, 2016 FINDINGS: Mild opacity in the right base has platelike components. The heart, hila, mediastinum, lungs, and pleura are otherwise unremarkable. IMPRESSION: Mild opacity in the right base has platelike components suggesting possible atelectasis. Early infiltrate not excluded. Recommend follow-up to resolution. Electronically Signed   By: Dorise Bullion III M.D   On: 09/10/2018 08:52    ekg: nsr at 41,  Nl axis, j point elevation in v2,  No st-t changes c/w ischemia   Assessment & Plan:    Principal Problem:   COVID-19 virus infection Active Problems:   Diabetes ([place])   Nausea and vomiting   ARF (acute renal failure) ([facility])  N/v STOP Victoza (pt has been off for 1 week) Pepcid '20mg'$  po qday Florastor 1 po bid Zofran '4mg'$  iv q6h prn  Non-AG acidosis secondary to n/v Check cmp in am  ARF on CKD stage 3 Check urinalysis Prior renal ultrasound 04/17/2017 negative for hydronephrosis Hydrate with ns iv Check cmp in am  Hypertension STOP Lisinopril / hydrochlorothiazide Hydrate with ns iv Check cmp in am Start amlodipine 2.'5mg'$  po qday Hydralazine '5mg'$  iv q6h prn  sbp >160  Dm2 STOP Victoza as above Cont NPH 70/30 fsbs ac and qhs, ISS  Cough w yellow sputum ? CAP Blood culture x2 Check urine strep antigen Check urine legionella antigen Rocephin 1gm iv qday, Zithromax '500mg'$  iv qday  Covid-19 positive Check crp esr, Ldh, IL-6, cpk, d dimer No dexamethasone for now since not hypoxic.  Continue to monitor  DVT Prophylaxis-   Heparin-  SCDs   AM Labs Ordered, also  please review Full Orders  Family Communication: Admission, patients condition and plan of care including tests being ordered have been discussed with the patient  who indicate understanding and agree with the plan and Code Status.  Code Status:  FULL CODE, notified Rosemary Pentecost his mother that pt will be admitted to the [facility]  Admission status: Observation/: Based on patients clinical presentation and evaluation of above clinical data, I have made determination that patient meets observation criteria at this time.    Time spent in minutes : 70   Jani Gravel M.D on 09/11/2018 at 11:50 PM

## 2018-09-11 NOTE — Telephone Encounter (Signed)
Patient was tested positive for covid and would like another medication for nausea due to current medication not owrking.

## 2018-09-12 DIAGNOSIS — Z79899 Other long term (current) drug therapy: Secondary | ICD-10-CM | POA: Diagnosis not present

## 2018-09-12 DIAGNOSIS — U071 COVID-19: Secondary | ICD-10-CM | POA: Diagnosis present

## 2018-09-12 DIAGNOSIS — E1165 Type 2 diabetes mellitus with hyperglycemia: Secondary | ICD-10-CM | POA: Diagnosis not present

## 2018-09-12 DIAGNOSIS — N183 Chronic kidney disease, stage 3 (moderate): Secondary | ICD-10-CM | POA: Diagnosis not present

## 2018-09-12 DIAGNOSIS — Z6835 Body mass index (BMI) 35.0-35.9, adult: Secondary | ICD-10-CM | POA: Diagnosis not present

## 2018-09-12 DIAGNOSIS — E860 Dehydration: Secondary | ICD-10-CM | POA: Diagnosis not present

## 2018-09-12 DIAGNOSIS — N179 Acute kidney failure, unspecified: Secondary | ICD-10-CM | POA: Diagnosis not present

## 2018-09-12 DIAGNOSIS — E1122 Type 2 diabetes mellitus with diabetic chronic kidney disease: Secondary | ICD-10-CM | POA: Diagnosis not present

## 2018-09-12 DIAGNOSIS — I129 Hypertensive chronic kidney disease with stage 1 through stage 4 chronic kidney disease, or unspecified chronic kidney disease: Secondary | ICD-10-CM | POA: Diagnosis not present

## 2018-09-12 DIAGNOSIS — E872 Acidosis: Secondary | ICD-10-CM | POA: Diagnosis not present

## 2018-09-12 DIAGNOSIS — Z794 Long term (current) use of insulin: Secondary | ICD-10-CM | POA: Diagnosis not present

## 2018-09-12 DIAGNOSIS — T380X5A Adverse effect of glucocorticoids and synthetic analogues, initial encounter: Secondary | ICD-10-CM | POA: Diagnosis not present

## 2018-09-12 DIAGNOSIS — Z89421 Acquired absence of other right toe(s): Secondary | ICD-10-CM | POA: Diagnosis not present

## 2018-09-12 DIAGNOSIS — Z833 Family history of diabetes mellitus: Secondary | ICD-10-CM | POA: Diagnosis not present

## 2018-09-12 DIAGNOSIS — E119 Type 2 diabetes mellitus without complications: Secondary | ICD-10-CM | POA: Diagnosis not present

## 2018-09-12 LAB — GLUCOSE, CAPILLARY: Glucose-Capillary: 133 mg/dL — ABNORMAL HIGH (ref 70–99)

## 2018-09-12 LAB — CBG MONITORING, ED
Glucose-Capillary: 151 mg/dL — ABNORMAL HIGH (ref 70–99)
Glucose-Capillary: 135 mg/dL — ABNORMAL HIGH (ref 70–99)
Glucose-Capillary: 124 mg/dL — ABNORMAL HIGH (ref 70–99)
Glucose-Capillary: 122 mg/dL — ABNORMAL HIGH (ref 70–99)

## 2018-09-12 LAB — SARS CORONAVIRUS 2 BY RT PCR (HOSPITAL ORDER, PERFORMED IN ~~LOC~~ HOSPITAL LAB): SARS Coronavirus 2: POSITIVE — AB

## 2018-09-12 LAB — FIBRINOGEN: Fibrinogen: 610 mg/dL — ABNORMAL HIGH (ref 210–475)

## 2018-09-12 LAB — CK TOTAL AND CKMB (NOT AT ARMC)
CK, MB: 0.8 ng/mL (ref 0.5–5.0)
Relative Index: 0.2 (ref 0.0–2.5)
Total CK: 495 U/L — ABNORMAL HIGH (ref 49–397)

## 2018-09-12 LAB — SEDIMENTATION RATE: Sed Rate: 82 mm/hr — ABNORMAL HIGH (ref 0–16)

## 2018-09-12 LAB — STREP PNEUMONIAE URINARY ANTIGEN: Strep Pneumo Urinary Antigen: NEGATIVE

## 2018-09-12 LAB — D-DIMER, QUANTITATIVE: D-Dimer, Quant: 0.53 ug/mL-FEU — ABNORMAL HIGH (ref 0.00–0.50)

## 2018-09-12 LAB — ABO/RH: ABO/RH(D): B POS

## 2018-09-12 LAB — HIV ANTIBODY (ROUTINE TESTING W REFLEX): HIV Screen 4th Generation wRfx: NONREACTIVE

## 2018-09-12 MED ORDER — DEXAMETHASONE SODIUM PHOSPHATE 10 MG/ML IJ SOLN
8.0000 mg | INTRAMUSCULAR | Status: DC
  Administered 2018-09-12: 21:00:00 8 mg via INTRAVENOUS
  Filled 2018-09-12: qty 1

## 2018-09-12 MED ORDER — INSULIN ASPART PROT & ASPART (70-30 MIX) 100 UNIT/ML ~~LOC~~ SUSP
10.0000 [IU] | Freq: Two times a day (BID) | SUBCUTANEOUS | Status: DC
  Filled 2018-09-12: qty 10

## 2018-09-12 MED ORDER — DM-GUAIFENESIN ER 30-600 MG PO TB12
1.0000 | ORAL_TABLET | Freq: Two times a day (BID) | ORAL | Status: DC
  Administered 2018-09-12 – 2018-09-15 (×6): 1 via ORAL
  Filled 2018-09-12 (×6): qty 1

## 2018-09-12 MED ORDER — ONDANSETRON HCL 4 MG/2ML IJ SOLN
4.0000 mg | Freq: Four times a day (QID) | INTRAMUSCULAR | Status: DC | PRN
  Administered 2018-09-12: 02:00:00 4 mg via INTRAVENOUS
  Filled 2018-09-12: qty 2

## 2018-09-12 MED ORDER — VITAMIN C 500 MG PO TABS
500.0000 mg | ORAL_TABLET | Freq: Every day | ORAL | Status: DC
  Administered 2018-09-13 – 2018-09-15 (×3): 500 mg via ORAL
  Filled 2018-09-12 (×3): qty 1

## 2018-09-12 MED ORDER — ACETAMINOPHEN 325 MG PO TABS
650.0000 mg | ORAL_TABLET | Freq: Four times a day (QID) | ORAL | Status: DC | PRN
  Administered 2018-09-12 – 2018-09-14 (×2): 650 mg via ORAL
  Filled 2018-09-12 (×2): qty 2

## 2018-09-12 MED ORDER — FAMOTIDINE 20 MG PO TABS
20.0000 mg | ORAL_TABLET | Freq: Every day | ORAL | Status: DC
  Administered 2018-09-12 – 2018-09-15 (×4): 20 mg via ORAL
  Filled 2018-09-12 (×4): qty 1

## 2018-09-12 MED ORDER — INSULIN GLARGINE 100 UNIT/ML ~~LOC~~ SOLN
10.0000 [IU] | Freq: Two times a day (BID) | SUBCUTANEOUS | Status: DC
  Filled 2018-09-12: qty 0.1

## 2018-09-12 MED ORDER — ONDANSETRON HCL 4 MG PO TABS: 4.0000 mg | ORAL_TABLET | Freq: Three times a day (TID) | ORAL | 0 refills | Status: DC | PRN

## 2018-09-12 MED ORDER — ISOSORBIDE MONONITRATE ER 60 MG PO TB24
60.0000 mg | ORAL_TABLET | Freq: Every day | ORAL | Status: DC
  Administered 2018-09-12 – 2018-09-15 (×4): 60 mg via ORAL
  Filled 2018-09-12 (×5): qty 1

## 2018-09-12 MED ORDER — ZINC SULFATE 220 (50 ZN) MG PO CAPS
220.0000 mg | ORAL_CAPSULE | Freq: Every day | ORAL | Status: DC
  Administered 2018-09-13 – 2018-09-15 (×3): 220 mg via ORAL
  Filled 2018-09-12 (×3): qty 1

## 2018-09-12 MED FILL — ONDANSETRON HCL 4 MG TABLET: 4 | 6 days supply | Qty: 20 | Fill #0

## 2018-09-12 NOTE — Progress Notes (Signed)
PROGRESS NOTE  Jerry Arnold AVW:[id number] DOB: 09-Dec-1985 DOA: 09/11/2018 PCP: Hoy RegisterNewlin, Enobong, MD  Brief History:  He was tested positive for covid 19 on 7/16, he was evaluated in the ED on 7/19 due to loss of sense of taste and smell, n/v without ab pain, he received hydration and antiemetic and was sent home from the ED. He returned to the ED on 7/22 due to  persistent, worsening fevers, chills, generalized weakness, productive cough, body aches, loss of taste and smell, nausea, vomiting, later left ED AMA . He returned back to the ED on 7/23 due to progression of symptoms and severe generalized weakness and agreed to be admitted to the hospital.  He does not recall any specific sick contact with anyone with COVID-19. But He admits to recently traveled to [place] and stayed there for approximately 5 days with a group of friends    HPI/Recap of past 24 hours:  tmax 100.2, he is on room air at rest, no cough noticed during encounter,  He reports able to keep liquid down, no vomiting since in the ED He denies pain  Assessment/Plan: Principal Problem:   COVID-19 virus infection Active Problems:   Diabetes (HCC)   Nausea and vomiting   ARF (acute renal failure) (HCC)  COVID19 infection -+ fever, + elevated inflammatory markers, no hypoxia,  -cxr "Mild opacity in the right base has platelike components suggesting possible atelectasis. Early infiltrate not excluded. Recommend follow-up to resolution." -procalcitonin less than 0.1, urine strep pneumo antigen negative -add on ua, blood culture  -will start remdesivir, steroids ( iv steroids for now, change to oral once n/v improves)  N/V  No ab pain -none since in the ED, able to tolerate liquid -supportive treatment -advance diet as tolerated  AKI on CKDII  -cr baseline in 05/2018 is 1.6 -cr has been above 2 since 7/19 -add on ua and urine culture -hold home meds lisinopril/hctz -he received gentle hydration in the ED, will  hold off ivf due to covid 19 infection, encourage oral intake  HTN: -continue imdur, hold lisionpril/HCTZ  Insulin dependent dm2 -anion gap 15 -expect elevated blood glucose while on steroids -start lantus 10units bid, continue ssi    Code Status: full  Family Communication: patient   Disposition Plan: not ready to discharge   Consultants:  none  Procedures:  None   Antibiotics:  Rocephin/zithro   Objective: BP (!) 151/100   Pulse 97   Temp 99.9 F (37.7 C) (Oral)   Resp (!) 26   SpO2 97%   Intake/Output Summary (Last 24 hours) at 09/12/2018 1601 Last data filed at 09/12/2018 0132 Gross per 24 hour  Intake -  Output 650 ml  Net -650 ml   There were no vitals filed for this visit.  Exam: Patient is examined daily including today on 09/12/2018, exams remain the same as of yesterday except that has changed    General:  Appear weak, NAD  Cardiovascular: RRR  Respiratory: CTABL  Abdomen: Soft/ND/NT, positive BS  Musculoskeletal: No Edema  Neuro: alert, oriented   Data Reviewed: Basic Metabolic Panel: Recent Labs  Lab 09/07/18 1336 09/10/18 0525 09/10/18 1129 09/11/18 2122 09/11/18 2249  NA 137 137 138 136  --   K 5.1 4.4 4.2 4.2  --   CL 102 103 104 101  --   CO2 24 23 25  20*  --   GLUCOSE 138* 149* 92 142*  --   BUN 21* 30* 30* 29*  --  CREATININE 2.21* 2.43* 2.22* 2.20* 2.26*  CALCIUM 9.1 8.4* 8.1* 8.4*  --    Liver Function Tests: Recent Labs  Lab 09/10/18 0525 09/11/18 2122  AST 26 31  ALT 21 22  ALKPHOS 78 75  BILITOT 0.5 0.4  PROT 8.2* 7.9  ALBUMIN 3.9 3.6   Recent Labs  Lab 09/10/18 0525  LIPASE 40   No results for input(s): AMMONIA in the last 168 hours. CBC: Recent Labs  Lab 09/07/18 1336 09/10/18 0525 09/11/18 2122  WBC 4.0 4.6 4.8  NEUTROABS 2.3 2.7 2.9  HGB 13.4 12.3* 12.7*  HCT 41.2 38.3* 39.9  MCV 88.4 88.7 88.1  PLT 193 151 173   Cardiac Enzymes:   Recent Labs  Lab 09/12/18 0030  CKTOTAL 495*   CKMB 0.8   BNP (last 3 results) No results for input(s): BNP in the last 8760 hours.  ProBNP (last 3 results) No results for input(s): PROBNP in the last 8760 hours.  CBG: Recent Labs  Lab 09/07/18 1403 09/12/18 0635 09/12/18 0835 09/12/18 1317  GLUCAP 106* 151* 135* 124*    Recent Results (from the past 240 hour(s))  SARS Coronavirus 2 (CEPHEID- Performed in [facility] lab), Hosp Order     Status: Abnormal   Collection Time: 09/07/18  1:36 PM   Specimen: Nasopharyngeal Swab  Result Value Ref Range Status   SARS Coronavirus 2 POSITIVE (A) NEGATIVE Final    Comment: CRITICAL RESULT CALLED TO, READ BACK BY AND VERIFIED WITH: RN CRIS CHRISCO 1531 [id number] FCP (NOTE) If result is NEGATIVE SARS-CoV-2 target nucleic acids are NOT DETECTED. The SARS-CoV-2 RNA is generally detectable in upper and lower  respiratory specimens during the acute phase of infection. The lowest  concentration of SARS-CoV-2 viral copies this assay can detect is 250  copies / mL. A negative result does not preclude SARS-CoV-2 infection  and should not be used as the sole basis for treatment or other  patient management decisions.  A negative result may occur with  improper specimen collection / handling, submission of specimen other  than nasopharyngeal swab, presence of viral mutation(s) within the  areas targeted by this assay, and inadequate number of viral copies  (<250 copies / mL). A negative result must be combined with clinical  observations, patient history, and epidemiological information. If result is POSITIVE SARS-CoV-2 target nucleic acids are DETECTE D. The SARS-CoV-2 RNA is generally detectable in upper and lower  respiratory specimens during the acute phase of infection.  Positive  results are indicative of active infection with SARS-CoV-2.  Clinical  correlation with patient history and other diagnostic information is  necessary to determine patient infection status.   Positive results do  not rule out bacterial infection or co-infection with other viruses. If result is PRESUMPTIVE POSTIVE SARS-CoV-2 nucleic acids MAY BE PRESENT.   A presumptive positive result was obtained on the submitted specimen  and confirmed on repeat testing.  While 2019 novel coronavirus  (SARS-CoV-2) nucleic acids may be present in the submitted sample  additional confirmatory testing may be necessary for epidemiological  and / or clinical management purposes  to differentiate between  SARS-CoV-2 and other Sarbecovirus currently known to infect humans.  If clinically indicated additional testing with an alternate test  methodology (LAB745 3) is advised. The SARS-CoV-2 RNA is generally  detectable in upper and lower respiratory specimens during the acute  phase of infection. The expected result is Negative. Fact Sheet for Patients:  BoilerBrush.com.cyhttps://www.fda.gov/media/136312/download Fact Sheet for Healthcare Providers:  https://pope.com/https://www.fda.gov/media/136313/download This test is not yet approved or cleared by the [place] FDA and has been authorized for detection and/or diagnosis of SARS-CoV-2 by FDA under an Emergency Use Authorization (EUA).  This EUA will remain in effect (meaning this test can be used) for the duration of the COVID-19 declaration under Section 564(b)(1) of the Act, 21 U.S.C. section 360bbb-3(b)(1), unless the authorization is terminated or revoked sooner. Performed at [facility], [address] [place], [place] [address]   SARS Coronavirus 2 (CEPHEID - Performed in [facility] hospital lab), Hosp Order     Status: Abnormal   Collection Time: 09/11/18 11:07 PM   Specimen: Nasal Mucosa; Nasopharyngeal  Result Value Ref Range Status   SARS Coronavirus 2 POSITIVE (A) NEGATIVE Final    Comment: CRITICAL RESULT CALLED TO, READ BACK BY AND VERIFIED WITH: RN J NASH AT 0112 09/12/18 CRUICKSHANK A (NOTE) If result is NEGATIVE SARS-CoV-2 target nucleic acids  are NOT DETECTED. The SARS-CoV-2 RNA is generally detectable in upper and lower  respiratory specimens during the acute phase of infection. The lowest  concentration of SARS-CoV-2 viral copies this assay can detect is 250  copies / mL. A negative result does not preclude SARS-CoV-2 infection  and should not be used as the sole basis for treatment or other  patient management decisions.  A negative result may occur with  improper specimen collection / handling, submission of specimen other  than nasopharyngeal swab, presence of viral mutation(s) within the  areas targeted by this assay, and inadequate number of viral copies  (<250 copies / mL). A negative result must be combined with clinical  observations, patient history, and epidemiological information. If result is POSITIVE SARS-CoV-2 target nucleic acids are  DETECTED. The SARS-CoV-2 RNA is generally detectable in upper and lower  respiratory specimens during the acute phase of infection.  Positive  results are indicative of active infection with SARS-CoV-2.  Clinical  correlation with patient history and other diagnostic information is  necessary to determine patient infection status.  Positive results do  not rule out bacterial infection or co-infection with other viruses. If result is PRESUMPTIVE POSTIVE SARS-CoV-2 nucleic acids MAY BE PRESENT.   A presumptive positive result was obtained on the submitted specimen  and confirmed on repeat testing.  While 2019 novel coronavirus  (SARS-CoV-2) nucleic acids may be present in the submitted sample  additional confirmatory testing may be necessary for epidemiological  and / or clinical management purposes  to differentiate between  SARS-CoV-2 and other Sarbecovirus currently known to infect humans.  If clinically indicated additional testing with an alternate test  methodology  [phone]([id number]) is advised. The SARS-CoV-2 RNA is generally  detectable in upper and lower respiratory specimens  during the acute  phase of infection. The expected result is Negative. Fact Sheet for Patients:  BoilerBrush.com.cyhttps://www.fda.gov/media/136312/download Fact Sheet for Healthcare Providers: https://pope.com/https://www.fda.gov/media/136313/download This test is not yet approved or cleared by the [place] FDA and has been authorized for detection and/or diagnosis of SARS-CoV-2 by FDA under an Emergency Use Authorization (EUA).  This EUA will remain in effect (meaning this test can be used) for the duration of the COVID-19 declaration under Section 564(b)(1) of the Act, 21 U.S.C. section 360bbb-3(b)(1), unless the authorization is terminated or revoked sooner. Performed at [facility], [address] [place], [place] [address]      Studies: No results found.  Scheduled Meds: . famotidine  20 mg Oral Daily  . heparin  5,000 Units  Subcutaneous Q8H  . insulin aspart  0-5 Units Subcutaneous QHS  . insulin aspart  0-9 Units Subcutaneous TID WC  . saccharomyces boulardii  250 mg Oral BID    Continuous Infusions: . sodium chloride 100 mL/hr at 09/11/18 2254  . azithromycin Stopped (09/12/18 0412)  . cefTRIAXone (ROCEPHIN)  IV Stopped (09/12/18 9450)     Time spent: 35mins I have personally reviewed and interpreted on  09/12/2018 daily labs, tele strips, imagings as discussed above under date review session and assessment and plans.  I reviewed all nursing notes, pharmacy notes, consultant notes,  vitals, pertinent old records  I have discussed plan of care as described above with RN , patient  on 09/12/2018   Florencia Reasons MD, PhD  [facility] Pager [phone]. If 7PM-7AM, please contact night-coverage at [email or web address], password [facility] 09/12/2018, 4:01 PM  LOS: 0 days

## 2018-09-12 NOTE — Progress Notes (Signed)
Sent hospitallist text for patients request for cough medicine

## 2018-09-12 NOTE — Telephone Encounter (Signed)
I have sent a prescription for Zofran to the pharmacy

## 2018-09-12 NOTE — Telephone Encounter (Signed)
Patient's aunt was called and informed that Zofran has been sent to onsite pharmacy.

## 2018-09-12 NOTE — ED Notes (Addendum)
Date and time results received: 09/12/18 0112 (use smartphrase ".now" to insert current time)  Test: Covid  Critical Value: positive  Name of Provider Notified: kim Jasline Buskirk Rinks md  Orders Received? Or Actions Taken?: waiting on orders

## 2018-09-12 NOTE — ED Notes (Signed)
[id number] 

## 2018-09-12 NOTE — Progress Notes (Signed)
Report received during BSR, pt reports improvement since yesterday. He denies pain, SOB and nausea. He does report persistent productive cough. Temp is 101.2

## 2018-09-12 NOTE — ED Notes (Signed)
[facility] called for transport to [facility]

## 2018-09-13 DIAGNOSIS — E1165 Type 2 diabetes mellitus with hyperglycemia: Secondary | ICD-10-CM | POA: Diagnosis present

## 2018-09-13 DIAGNOSIS — Z6835 Body mass index (BMI) 35.0-35.9, adult: Secondary | ICD-10-CM | POA: Diagnosis not present

## 2018-09-13 DIAGNOSIS — Z89421 Acquired absence of other right toe(s): Secondary | ICD-10-CM | POA: Diagnosis not present

## 2018-09-13 DIAGNOSIS — E119 Type 2 diabetes mellitus without complications: Secondary | ICD-10-CM | POA: Diagnosis not present

## 2018-09-13 DIAGNOSIS — T380X5A Adverse effect of glucocorticoids and synthetic analogues, initial encounter: Secondary | ICD-10-CM | POA: Diagnosis present

## 2018-09-13 DIAGNOSIS — I129 Hypertensive chronic kidney disease with stage 1 through stage 4 chronic kidney disease, or unspecified chronic kidney disease: Secondary | ICD-10-CM | POA: Diagnosis present

## 2018-09-13 DIAGNOSIS — N183 Chronic kidney disease, stage 3 (moderate): Secondary | ICD-10-CM | POA: Diagnosis present

## 2018-09-13 DIAGNOSIS — E872 Acidosis: Secondary | ICD-10-CM | POA: Diagnosis present

## 2018-09-13 DIAGNOSIS — E1122 Type 2 diabetes mellitus with diabetic chronic kidney disease: Secondary | ICD-10-CM | POA: Diagnosis present

## 2018-09-13 DIAGNOSIS — R112 Nausea with vomiting, unspecified: Secondary | ICD-10-CM | POA: Diagnosis not present

## 2018-09-13 DIAGNOSIS — N179 Acute kidney failure, unspecified: Secondary | ICD-10-CM | POA: Diagnosis present

## 2018-09-13 DIAGNOSIS — Z833 Family history of diabetes mellitus: Secondary | ICD-10-CM | POA: Diagnosis not present

## 2018-09-13 DIAGNOSIS — Z794 Long term (current) use of insulin: Secondary | ICD-10-CM | POA: Diagnosis not present

## 2018-09-13 DIAGNOSIS — E860 Dehydration: Secondary | ICD-10-CM | POA: Diagnosis present

## 2018-09-13 DIAGNOSIS — Z79899 Other long term (current) drug therapy: Secondary | ICD-10-CM | POA: Diagnosis not present

## 2018-09-13 DIAGNOSIS — U071 COVID-19: Secondary | ICD-10-CM | POA: Diagnosis present

## 2018-09-13 LAB — COMPREHENSIVE METABOLIC PANEL
ALT: 29 U/L (ref 0–44)
AST: 38 U/L (ref 15–41)
Albumin: 3.5 g/dL (ref 3.5–5.0)
Alkaline Phosphatase: 79 U/L (ref 38–126)
Anion gap: 11 (ref 5–15)
BUN: 24 mg/dL — ABNORMAL HIGH (ref 6–20)
CO2: 23 mmol/L (ref 22–32)
Calcium: 8.5 mg/dL — ABNORMAL LOW (ref 8.9–10.3)
Chloride: 104 mmol/L (ref 98–111)
Creatinine, Ser: 1.71 mg/dL — ABNORMAL HIGH (ref 0.61–1.24)
GFR calc Af Amer: 60 mL/min — ABNORMAL LOW (ref >60)
GFR calc non Af Amer: 51 mL/min — ABNORMAL LOW (ref >60)
Glucose, Bld: 317 mg/dL — ABNORMAL HIGH (ref 70–99)
Potassium: 5.1 mmol/L (ref 3.5–5.1)
Sodium: 138 mmol/L (ref 135–145)
Total Bilirubin: 0.4 mg/dL (ref 0.3–1.2)
Total Protein: 8.2 g/dL — ABNORMAL HIGH (ref 6.5–8.1)

## 2018-09-13 LAB — GLUCOSE, CAPILLARY
Glucose-Capillary: 272 mg/dL — ABNORMAL HIGH (ref 70–99)
Glucose-Capillary: 281 mg/dL — ABNORMAL HIGH (ref 70–99)
Glucose-Capillary: 195 mg/dL — ABNORMAL HIGH (ref 70–99)
Glucose-Capillary: 319 mg/dL — ABNORMAL HIGH (ref 70–99)

## 2018-09-13 LAB — CBC
HCT: 36.9 % — ABNORMAL LOW (ref 39.0–52.0)
Hemoglobin: 11.8 g/dL — ABNORMAL LOW (ref 13.0–17.0)
MCH: 28.1 pg (ref 26.0–34.0)
MCHC: 32 g/dL (ref 30.0–36.0)
MCV: 87.9 fL (ref 80.0–100.0)
Platelets: 203 10*3/uL (ref 150–400)
RBC: 4.2 MIL/uL — ABNORMAL LOW (ref 4.22–5.81)
RDW: 13.5 % (ref 11.5–15.5)
WBC: 4.5 10*3/uL (ref 4.0–10.5)
nRBC: 0 % (ref 0.0–0.2)

## 2018-09-13 LAB — URINALYSIS, ROUTINE W REFLEX MICROSCOPIC
Bacteria, UA: NONE SEEN
Bilirubin Urine: NEGATIVE
Glucose, UA: 50 mg/dL — AB
Ketones, ur: 5 mg/dL — AB
Leukocytes,Ua: NEGATIVE
Nitrite: NEGATIVE
Protein, ur: 100 mg/dL — AB
Specific Gravity, Urine: 1.009 (ref 1.005–1.030)
pH: 6 (ref 5.0–8.0)

## 2018-09-13 LAB — HEPATITIS B SURFACE ANTIGEN: Hepatitis B Surface Ag: NEGATIVE

## 2018-09-13 LAB — GLUCOSE 6 PHOSPHATE DEHYDROGENASE
G6PDH: 8.7 U/g{Hb} (ref 4.6–13.5)
Hemoglobin: 11.9 g/dL — ABNORMAL LOW (ref 13.0–17.7)

## 2018-09-13 LAB — FIBRINOGEN: Fibrinogen: 633 mg/dL — ABNORMAL HIGH (ref 210–475)

## 2018-09-13 LAB — INTERLEUKIN-6, PLASMA: Interleukin-6, Plasma: 22.5 pg/mL — ABNORMAL HIGH (ref 0.0–12.2)

## 2018-09-13 LAB — LACTATE DEHYDROGENASE: LDH: 345 U/L — ABNORMAL HIGH (ref 98–192)

## 2018-09-13 LAB — D-DIMER, QUANTITATIVE: D-Dimer, Quant: 0.76 ug/mL-FEU — ABNORMAL HIGH (ref 0.00–0.50)

## 2018-09-13 MED ORDER — INSULIN ASPART 100 UNIT/ML ~~LOC~~ SOLN
3.0000 [IU] | Freq: Three times a day (TID) | SUBCUTANEOUS | Status: DC
  Administered 2018-09-13 – 2018-09-15 (×6): 3 [IU] via SUBCUTANEOUS

## 2018-09-13 MED ORDER — DEXAMETHASONE SODIUM PHOSPHATE 10 MG/ML IJ SOLN
6.0000 mg | INTRAMUSCULAR | Status: DC
  Administered 2018-09-13 – 2018-09-14 (×2): 6 mg via INTRAVENOUS
  Filled 2018-09-13 (×2): qty 1

## 2018-09-13 MED ORDER — INSULIN GLARGINE 100 UNIT/ML ~~LOC~~ SOLN
10.0000 [IU] | Freq: Every day | SUBCUTANEOUS | Status: DC
  Administered 2018-09-13 – 2018-09-14 (×2): 10 [IU] via SUBCUTANEOUS
  Filled 2018-09-13 (×2): qty 0.1

## 2018-09-13 NOTE — Progress Notes (Signed)
Patient is resting quietly in bed and denies pain, SOB and nausea. No needs expressed. Call light wtihin reach

## 2018-09-13 NOTE — Plan of Care (Signed)
POC reviewed with patient 

## 2018-09-13 NOTE — Progress Notes (Signed)
PROGRESS NOTE  Carren RangSamuel Borah AVW:[id number] DOB: 07/19/1985 DOA: 09/11/2018 PCP: Hoy RegisterNewlin, Enobong, MD   LOS: 0 days   Brief Narrative / Interim history: This is a 33 year old male with poorly controlled diabetes mellitus with hyperglycemia, chronic kidney disease stage III with baseline creatinine around 1.6, who presents to the hospital and is admitted on 09/11/2018 with nausea, vomiting, poor p.o. intake along with loss of taste and smell.  He was also complaining of fever, chills, generalized weakness, body aches.  He initially presented to the ED on 7/22 and left AMA.  He came back a day later due to progression of the symptoms and severe generalized weakness, he was diagnosed with coronavirus and was admitted initially to [facility] then transferred to [facility] as a bed became available.  Denies any known sick contacts but recently traveled to [place] with several friends and stayed there for 5 days.  Subjective: He tells me that he is feeling a little bit better this morning however continues to be quite weak.  He complains of being cold this morning.  He reports that his nausea has improved some and is able to start eating a little.  Assessment & Plan: Principal Problem:   COVID-19 virus infection Active Problems:   Insulin dependent diabetes mellitus (HCC)   Nausea and vomiting   ARF (acute renal failure) (HCC)   Principal Problem Covid-19 Viral Illness associated with GI symptoms -It seems like he is having mainly GI symptoms with nausea, vomiting, decreased p.o. intake.  He is also been having generalized weakness which is persistent today.  He was still febrile last night to 101.2, afebrile this morning. -Slowly advance diet -Symptomatic treatment with antiemetics,  -Started on steroids for elevated inflammatory markers, continue -He has no hypoxia, satting well on room air, does not meet criteria for Remdesivir.  Chest x-ray concerning for possible atelectasis versus early  infiltrate in the right base.  He was initially started on ceftriaxone and azithromycin however his procalcitonin is negative, he has minimal respiratory symptoms, discontinue antibiotics and closely monitor  COVID-19 Labs  Recent Labs    09/11/18 2137 09/11/18 2249 09/13/18 0940  DDIMER  --  0.53* 0.76*  FERRITIN 561* 488*  --   LDH 318* 263* 345*  CRP 9.4* 10.0*  --     Lab Results  Component Value Date   SARSCOV2NAA POSITIVE (A) 09/11/2018   SARSCOV2NAA POSITIVE (A) 09/07/2018   Active Problems Acute kidney injury on chronic kidney disease stage III -Baseline creatinine around 1.6.  His creatinine on admission was 2.4, received IV fluids and improved close to baseline at 1.7 today.  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 dexamethasone (DECADRON) injection  6 mg Intravenous Q24H  . dextromethorphan-guaiFENesin  1 tablet Oral BID  . famotidine  20 mg Oral Daily  . heparin  5,000 Units Subcutaneous Q8H  . insulin aspart  0-5 Units Subcutaneous QHS  . insulin aspart  0-9 Units Subcutaneous TID WC  . insulin glargine  10 Units Subcutaneous Daily  .  isosorbide mononitrate  60 mg Oral Daily  . saccharomyces boulardii  250 mg Oral BID  . vitamin C  500 mg Oral Daily  . zinc sulfate  220 mg Oral Daily   Continuous Infusions: . azithromycin Stopped (09/13/18 0200)  . cefTRIAXone (ROCEPHIN)  IV Stopped (09/12/18 2300)    PRN Meds:.acetaminophen, ondansetron (ZOFRAN) IV  DVT prophylaxis: heparin Code Status: Full code Family Communication: plan d/w patient  Disposition Plan: home when ready   Consultants:   None   Procedures:   None   Antimicrobials:  Ceftriaxone / Azithromycin 7/23 >> 7/25   Objective: Vitals:   09/12/18 2030 09/13/18 0000 09/13/18 0438 09/13/18 0835  BP: (!) 141/95  116/70 (!) 138/95  Pulse: 98  92 90  Resp: 20  20 18   Temp: (!) 101.2 F (38.4 C) 98.7 F (37.1 C) 98.7 F (37.1 C) 98.6 F (37 C)  TempSrc: Oral Oral Oral Oral  SpO2: 98%  99% 99%    Intake/Output Summary (Last 24 hours) at 09/13/2018 1239 Last data filed at 09/13/2018 1000 Gross per 24 hour  Intake 960 ml  Output 1800 ml  Net -840 ml   There were no vitals filed for this visit.  Examination:  Constitutional: NAD Eyes: PERRL, lids and conjunctivae normal ENMT: Mucous membranes are moist.  Respiratory: clear to auscultation bilaterally, no wheezing, no crackles. Normal respiratory effort. No accessory muscle use.  Cardiovascular: Regular rate and rhythm, no murmurs / rubs / gallops. No LE edema  Abdomen: no tenderness. Bowel sounds positive.  Musculoskeletal: no clubbing / cyanosis. Skin: no rashes Neurologic: CN 2-12 grossly intact. Strength 5/5 in all 4.   Data Reviewed: I have independently reviewed following labs and imaging studies   CBC: Recent Labs  Lab 09/07/18 1336 09/10/18 0525 09/11/18 2122 09/13/18 0940  WBC 4.0 4.6 4.8 4.5  NEUTROABS 2.3 2.7 2.9  --   HGB 13.4 12.3* 12.7* 11.8*  HCT 41.2 38.3* 39.9 36.9*  MCV 88.4 88.7 88.1 87.9  PLT 193 151 173 203   Basic Metabolic Panel: Recent Labs  Lab 09/07/18 1336 09/10/18 0525 09/10/18 1129 09/11/18 2122 09/11/18 2249 09/13/18 0940  NA 137 137 138 136  --  138  K 5.1 4.4 4.2 4.2  --  5.1  CL 102 103 104 101  --  104  CO2 24 23 25  20*  --  23  GLUCOSE 138* 149* 92 142*  --  317*  BUN 21* 30* 30* 29*  --  24*   CREATININE 2.21* 2.43* 2.22* 2.20* 2.26* 1.71*  CALCIUM 9.1 8.4* 8.1* 8.4*  --  8.5*   GFR: Estimated Creatinine Clearance: 83.6 mL/min (A) (by C-G formula based on SCr of 1.71 mg/dL (H)). Liver Function Tests: Recent Labs  Lab 09/10/18 0525 09/11/18 2122 09/13/18 0940  AST 26 31 38  ALT 21 22 29   ALKPHOS 78 75 79  BILITOT 0.5 0.4 0.4  PROT 8.2* 7.9 8.2*  ALBUMIN 3.9 3.6 3.5   Recent Labs  Lab 09/10/18 0525  LIPASE 40   No results for input(s): AMMONIA in the last 168 hours. Coagulation Profile: No results for input(s): INR, PROTIME in the last 168 hours. Cardiac Enzymes: Recent Labs  Lab 09/12/18 0030  CKTOTAL 495*  CKMB 0.8   BNP (last 3 results) No results for input(s): PROBNP in the last 8760 hours. HbA1C: No results for input(s): HGBA1C in the last 72 hours. CBG: Recent Labs  Lab 09/12/18 1317 09/12/18 1716 09/12/18 2045 09/13/18  [id number] 09/13/18 1131  GLUCAP 124* 122* 133* 272* 281*   Lipid Profile: Recent Labs    09/11/18 2137  TRIG 131   Thyroid Function Tests: No results for input(s): TSH, T4TOTAL, FREET4, T3FREE, THYROIDAB in the last 72 hours. Anemia Panel: Recent Labs    09/11/18 2137 09/11/18 2249  FERRITIN 561* 488*   Urine analysis:    Component Value Date/Time   COLORURINE YELLOW 09/13/2018 0342   APPEARANCEUR CLEAR 09/13/2018 0342   LABSPEC 1.009 09/13/2018 0342   PHURINE 6.0 09/13/2018 0342   GLUCOSEU 50 (A) 09/13/2018 0342   HGBUR SMALL (A) 09/13/2018 0342   BILIRUBINUR NEGATIVE 09/13/2018 0342   KETONESUR 5 (A) 09/13/2018 0342   PROTEINUR 100 (A) 09/13/2018 0342   NITRITE NEGATIVE 09/13/2018 0342   LEUKOCYTESUR NEGATIVE 09/13/2018 0342   Sepsis Labs: Invalid input(s): PROCALCITONIN, LACTICIDVEN  Recent Results (from the past 240 hour(s))  SARS Coronavirus 2 (CEPHEID- Performed in [facility] hospital lab), Hosp Order     Status: Abnormal   Collection Time: 09/07/18  1:36 PM   Specimen: Nasopharyngeal Swab  Result  Value Ref Range Status   SARS Coronavirus 2 POSITIVE (A) NEGATIVE Final    Comment: CRITICAL RESULT CALLED TO, READ BACK BY AND VERIFIED WITH: RN CRIS CHRISCO 1531 [id number] FCP (NOTE) If result is NEGATIVE SARS-CoV-2 target nucleic acids are NOT DETECTED. The SARS-CoV-2 RNA is generally detectable in upper and lower  respiratory specimens during the acute phase of infection. The lowest  concentration of SARS-CoV-2 viral copies this assay can detect is 250  copies / mL. A negative result does not preclude SARS-CoV-2 infection  and should not be used as the sole basis for treatment or other  patient management decisions.  A negative result may occur with  improper specimen collection / handling, submission of specimen other  than nasopharyngeal swab, presence of viral mutation(s) within the  areas targeted by this assay, and inadequate number of viral copies  (<250 copies / mL). A negative result must be combined with clinical  observations, patient history, and epidemiological information. If result is POSITIVE SARS-CoV-2 target nucleic acids are DETECTE D. The SARS-CoV-2 RNA is generally detectable in upper and lower  respiratory specimens during the acute phase of infection.  Positive  results are indicative of active infection with SARS-CoV-2.  Clinical  correlation with patient history and other diagnostic information is  necessary to determine patient infection status.  Positive results do  not rule out bacterial infection or co-infection with other viruses. If result is PRESUMPTIVE POSTIVE SARS-CoV-2 nucleic acids MAY BE PRESENT.   A presumptive positive result was obtained on the submitted specimen  and confirmed on repeat testing.  While 2019 novel coronavirus  (SARS-CoV-2) nucleic acids may be present in the submitted sample  additional confirmatory testing may be necessary for epidemiological  and / or clinical management purposes  to differentiate between  SARS-CoV-2 and  other Sarbecovirus currently known to infect humans.  If clinically indicated additional testing with an alternate test  methodology (LAB745 3) is advised. The SARS-CoV-2 RNA is generally  detectable in upper and lower respiratory specimens during the acute  phase of infection. The expected result is Negative. Fact Sheet for Patients:  BoilerBrush.com.cyhttps://www.fda.gov/media/136312/download Fact Sheet for Healthcare Providers: https://pope.com/https://www.fda.gov/media/136313/download This test is not yet approved or cleared by the [place] FDA and has been authorized for detection and/or diagnosis of SARS-CoV-2 by FDA under an Emergency Use Authorization (EUA).  This EUA will remain in effect (meaning this  test can be used) for the duration of the COVID-19 declaration under Section 564(b)(1) of the Act, 21 U.S.C. section 360bbb-3(b)(1), unless the authorization is terminated or revoked sooner. Performed at [facility], [place] [address] [place], [place] [address]   SARS Coronavirus 2 (CEPHEID - Performed in [place] hospital lab), Hosp Order     Status: Abnormal   Collection Time: 09/11/18 11:07 PM   Specimen: Nasal Mucosa; Nasopharyngeal  Result Value Ref Range Status   SARS Coronavirus 2 POSITIVE (A) NEGATIVE Final    Comment: CRITICAL RESULT CALLED TO, READ BACK BY AND VERIFIED WITH: RN J NASH AT 0112 09/12/18 CRUICKSHANK A (NOTE) If result is NEGATIVE SARS-CoV-2 target nucleic acids are NOT DETECTED. The SARS-CoV-2 RNA is generally detectable in upper and lower  respiratory specimens during the acute phase of infection. The lowest  concentration of SARS-CoV-2 viral copies this assay can detect is 250  copies / mL. A negative result does not preclude SARS-CoV-2 infection  and should not be used as the sole basis for treatment or other  patient management decisions.  A negative result may occur with  improper specimen collection / handling, submission of specimen other  than nasopharyngeal swab,  presence of viral mutation(s) within the  areas targeted by this assay, and inadequate number of viral copies  (<250 copies / mL). A negative result must be combined with clinical  observations, patient history, and epidemiological information. If result is POSITIVE SARS-CoV-2 target nucleic acids are  DETECTED. The SARS-CoV-2 RNA is generally detectable in upper and lower  respiratory specimens during the acute phase of infection.  Positive  results are indicative of active infection with SARS-CoV-2.  Clinical  correlation with patient history and other diagnostic information is  necessary to determine patient infection status.  Positive results do  not rule out bacterial infection or co-infection with other viruses. If result is PRESUMPTIVE POSTIVE SARS-CoV-2 nucleic acids MAY BE PRESENT.   A presumptive positive result was obtained on the submitted specimen  and confirmed on repeat testing.  While 2019 novel coronavirus  (SARS-CoV-2) nucleic acids may be present in the submitted sample  additional confirmatory testing may be necessary for epidemiological  and / or clinical management purposes  to differentiate between  SARS-CoV-2 and other Sarbecovirus currently known to infect humans.  If clinically indicated additional testing with an alternate test  methodology  [phone]) is advised. The SARS-CoV-2 RNA is generally  detectable in upper and lower respiratory specimens during the acute  phase of infection. The expected result is Negative. Fact Sheet for Patients:  StrictlyIdeas.no Fact Sheet for Healthcare Providers: BankingDealers.co.za This test is not yet approved or cleared by the [place] FDA and has been authorized for detection and/or diagnosis of SARS-CoV-2 by FDA under an Emergency Use Authorization (EUA).  This EUA will remain in effect (meaning this test can be used) for the duration of the COVID-19 declaration under  Section 564(b)(1) of the Act, 21 U.S.C. section 360bbb-3(b)(1), unless the authorization is terminated or revoked sooner. Performed at [facility], [place] [address] [place], [place] [address]       Radiology Studies: No results found.  Marzetta Board, MD, PhD [facility]  Contact via  [email or web address]  [place] P: [phone] F: [phone]

## 2018-09-13 NOTE — Progress Notes (Signed)
Updated patients mother  on POC and condition via telephone. All questions answered.

## 2018-09-14 DIAGNOSIS — Z794 Long term (current) use of insulin: Secondary | ICD-10-CM

## 2018-09-14 DIAGNOSIS — N183 Chronic kidney disease, stage 3 (moderate): Secondary | ICD-10-CM

## 2018-09-14 DIAGNOSIS — E119 Type 2 diabetes mellitus without complications: Secondary | ICD-10-CM

## 2018-09-14 LAB — COMPREHENSIVE METABOLIC PANEL
ALT: 28 U/L (ref 0–44)
AST: 28 U/L (ref 15–41)
Albumin: 3.3 g/dL — ABNORMAL LOW (ref 3.5–5.0)
Alkaline Phosphatase: 71 U/L (ref 38–126)
Anion gap: 11 (ref 5–15)
BUN: 27 mg/dL — ABNORMAL HIGH (ref 6–20)
CO2: 20 mmol/L — ABNORMAL LOW (ref 22–32)
Calcium: 8.9 mg/dL (ref 8.9–10.3)
Chloride: 106 mmol/L (ref 98–111)
Creatinine, Ser: 1.34 mg/dL — ABNORMAL HIGH (ref 0.61–1.24)
GFR calc Af Amer: >60 mL/min (ref >60)
GFR calc non Af Amer: >60 mL/min (ref >60)
Glucose, Bld: 314 mg/dL — ABNORMAL HIGH (ref 70–99)
Potassium: 4.8 mmol/L (ref 3.5–5.1)
Sodium: 137 mmol/L (ref 135–145)
Total Bilirubin: 0.4 mg/dL (ref 0.3–1.2)
Total Protein: 7.9 g/dL (ref 6.5–8.1)

## 2018-09-14 LAB — CBC
HCT: 36.3 % — ABNORMAL LOW (ref 39.0–52.0)
Hemoglobin: 11.6 g/dL — ABNORMAL LOW (ref 13.0–17.0)
MCH: 27.8 pg (ref 26.0–34.0)
MCHC: 32 g/dL (ref 30.0–36.0)
MCV: 87.1 fL (ref 80.0–100.0)
Platelets: 226 10*3/uL (ref 150–400)
RBC: 4.17 MIL/uL — ABNORMAL LOW (ref 4.22–5.81)
RDW: 13.3 % (ref 11.5–15.5)
WBC: 4.8 10*3/uL (ref 4.0–10.5)
nRBC: 0 % (ref 0.0–0.2)

## 2018-09-14 LAB — URINE CULTURE: Culture: NO GROWTH

## 2018-09-14 LAB — MAGNESIUM: Magnesium: 2 mg/dL (ref 1.7–2.4)

## 2018-09-14 LAB — GLUCOSE, CAPILLARY
Glucose-Capillary: 292 mg/dL — ABNORMAL HIGH (ref 70–99)
Glucose-Capillary: 233 mg/dL — ABNORMAL HIGH (ref 70–99)
Glucose-Capillary: 174 mg/dL — ABNORMAL HIGH (ref 70–99)
Glucose-Capillary: 355 mg/dL — ABNORMAL HIGH (ref 70–99)

## 2018-09-14 LAB — LEGIONELLA PNEUMOPHILA SEROGP 1 UR AG: L. pneumophila Serogp 1 Ur Ag: NEGATIVE

## 2018-09-14 LAB — C-REACTIVE PROTEIN: CRP: 7.7 mg/dL — ABNORMAL HIGH (ref <1.0)

## 2018-09-14 LAB — LACTATE DEHYDROGENASE: LDH: 321 U/L — ABNORMAL HIGH (ref 98–192)

## 2018-09-14 LAB — D-DIMER, QUANTITATIVE: D-Dimer, Quant: 0.5 ug/mL-FEU (ref 0.00–0.50)

## 2018-09-14 LAB — FERRITIN: Ferritin: 562 ng/mL — ABNORMAL HIGH (ref 24–336)

## 2018-09-14 MED ORDER — INSULIN GLARGINE 100 UNIT/ML ~~LOC~~ SOLN
20.0000 [IU] | Freq: Every day | SUBCUTANEOUS | Status: DC
  Administered 2018-09-15: 10:00:00 20 [IU] via SUBCUTANEOUS
  Filled 2018-09-14: qty 0.2

## 2018-09-14 MED ORDER — INSULIN GLARGINE 100 UNIT/ML ~~LOC~~ SOLN
10.0000 [IU] | SUBCUTANEOUS | Status: AC
  Administered 2018-09-14: 10 [IU] via SUBCUTANEOUS
  Filled 2018-09-14: qty 0.1

## 2018-09-14 NOTE — Progress Notes (Signed)
PROGRESS NOTE  Jerry Arnold ZOX:[id number] DOB: 10-23-1985 DOA: 09/11/2018 PCP: Hoy RegisterNewlin, Enobong, MD  HPI/Recap of past 24 hours: This is a 33 year old male with poorly controlled diabetes mellitus with hyperglycemia, chronic kidney disease stage III with baseline creatinine around 1.6, who presents to the hospital and is admitted on 09/11/2018 with nausea, vomiting, poor p.o. intake along with loss of taste and smell.  He was also complaining of fever, chills, generalized weakness, body aches.  He initially presented to the ED on 7/22 and left AMA.  He came back a day later due to progression of the symptoms and severe generalized weakness, he was diagnosed with coronavirus and was admitted [facility] to [facility] then [facility] as [facility] bed became available.  Denies any known sick contacts but recently traveled to [place] with several friends and stayed there for 5 days.  Over the past 2 days, patient continues to feel better.  Staying on room air.  Tolerating p.o. without any nausea or vomiting.  Inflammatory markers slowly trending downward, still significantly elevated.  Assessment/Plan: Principal Problem:   COVID-19 virus infection: Fortunately, no evidence of hypoxia.  Doing well.  On steroids.  Inflammatory markers still quite elevated.  CRP at 7.7 and ferritin at 562.  Continue to follow.  Discharge once numbers much more closer to baseline Active Problems:    Nausea and vomiting: Secondary to impart COVID symptoms plus poorly controlled diabetes mellitus, better.  Tolerating p.o.    ARF (acute renal failure) (HCC) in the setting of stage III chronic kidney disease secondary to uncontrolled insulin-dependent diabetes mellitus with complications: Renal function now back to baseline.  Will increase Lantus today to cover for additional hyperglycemia brought on by steroids with underlying lack of control  Essential hypertension: Continue Imdur, holding lisinopril/HCTZ  Morbid  obesity: Patient meets criteria with BMI greater than 35+ diabetes and hypertension   Code Status: Full code  Family Communication: Left message for family  Disposition Plan: Potential discharge in the next 1 to 2 days once inflammatory markers much more normalized   Consultants:  None  Procedures:  None  Antimicrobials:  Rocephin and Zithromax 7/23-7/25  DVT prophylaxis: Heparin   Objective: Vitals:   09/14/18 0500 09/14/18 0839  BP: 118/90 (!) 137/108  Pulse:  87  Resp: 16   Temp: 98.6 F (37 C) 97.8 F (36.6 C)  SpO2:  100%    Intake/Output Summary (Last 24 hours) at 09/14/2018 1353 Last data filed at 09/14/2018 1000 Gross per 24 hour  Intake 1080 ml  Output 3550 ml  Net -2470 ml   Filed Weights   09/13/18 2330  Weight: 120.7 kg   Body mass index is 35.09 kg/m.  Exam:   General: Alert and oriented x3, no acute distress  HEENT: Normocephalic and atraumatic, mucous membranes are moist  Cardiovascular: Regular rate and rhythm, S1-S2  Respiratory: Clear to auscultation bilaterally  Abdomen: Soft, nontender, nondistended, positive bowel sounds  Musculoskeletal: No clubbing or cyanosis, trace pitting edema  Skin: No skin breaks, tears or lesions  Psychiatry: Appropriate, no evidence of psychoses   Data Reviewed: CBC: Recent Labs  Lab 09/10/18 0525 09/11/18 2122 09/11/18 2249 09/13/18 0940 09/14/18 0414  WBC 4.6 4.8  --  4.5 4.8  NEUTROABS 2.7 2.9  --   --   --   HGB 12.3* 12.7* 11.9* 11.8* 11.6*  HCT 38.3* 39.9  --  36.9* 36.3*  MCV 88.7 88.1  --  87.9 87.1  PLT 151 173  --  203 226   Basic Metabolic Panel: Recent Labs  Lab 09/10/18 0525 09/10/18 1129 09/11/18 2122 09/11/18 2249 09/13/18 0940 09/14/18 0414  NA 137 138 136  --  138 137  K 4.4 4.2 4.2  --  5.1 4.8  CL 103 104 101  --  104 106  CO2 23 25 20*  --  23 20*  GLUCOSE 149* 92 142*  --  317* 314*  BUN 30* 30* 29*  --  24* 27*  CREATININE 2.43* 2.22* 2.20* 2.26*  1.71* 1.34*  CALCIUM 8.4* 8.1* 8.4*  --  8.5* 8.9  MG  --   --   --   --   --  2.0   GFR: Estimated Creatinine Clearance: 106.7 mL/min (A) (by C-G formula based on SCr of 1.34 mg/dL (H)). Liver Function Tests: Recent Labs  Lab 09/10/18 0525 09/11/18 2122 09/13/18 0940 09/14/18 0414  AST 26 31 38 28  ALT 21 22 29 28   ALKPHOS 78 75 79 71  BILITOT 0.5 0.4 0.4 0.4  PROT 8.2* 7.9 8.2* 7.9  ALBUMIN 3.9 3.6 3.5 3.3*   Recent Labs  Lab 09/10/18 0525  LIPASE 40   No results for input(s): AMMONIA in the last 168 hours. Coagulation Profile: No results for input(s): INR, PROTIME in the last 168 hours. Cardiac Enzymes: Recent Labs  Lab 09/12/18 0030  CKTOTAL 495*  CKMB 0.8   BNP (last 3 results) No results for input(s): PROBNP in the last 8760 hours. HbA1C: No results for input(s): HGBA1C in the last 72 hours. CBG: Recent Labs  Lab 09/13/18 1131 09/13/18 1612 09/13/18 2022 09/14/18 0742 09/14/18 1203  GLUCAP 281* 195* 319* 292* 233*   Lipid Profile: Recent Labs    09/11/18 2137  TRIG 131   Thyroid Function Tests: No results for input(s): TSH, T4TOTAL, FREET4, T3FREE, THYROIDAB in the last 72 hours. Anemia Panel: Recent Labs    09/11/18 2249 09/14/18 0414  FERRITIN 488* 562*   Urine analysis:    Component Value Date/Time   COLORURINE YELLOW 09/13/2018 0342   APPEARANCEUR CLEAR 09/13/2018 0342   LABSPEC 1.009 09/13/2018 0342   PHURINE 6.0 09/13/2018 0342   GLUCOSEU 50 (A) 09/13/2018 0342   HGBUR SMALL (A) 09/13/2018 0342   BILIRUBINUR NEGATIVE 09/13/2018 0342   KETONESUR 5 (A) 09/13/2018 0342   PROTEINUR 100 (A) 09/13/2018 0342   NITRITE NEGATIVE 09/13/2018 0342   LEUKOCYTESUR NEGATIVE 09/13/2018 0342   Sepsis Labs: @LABRCNTIP (procalcitonin:4,lacticidven:4)  ) Recent Results (from the past 240 hour(s))  SARS Coronavirus 2 (CEPHEID- Performed in [facility] hospital lab), Hosp Order     Status: Abnormal   Collection Time: 09/07/18  1:36 PM    Specimen: Nasopharyngeal Swab  Result Value Ref Range Status   SARS Coronavirus 2 POSITIVE (A) NEGATIVE Final    Comment: CRITICAL RESULT CALLED TO, READ BACK BY AND VERIFIED WITH: RN CRIS CHRISCO 1531 [id number] FCP (NOTE) If result is NEGATIVE SARS-CoV-2 target nucleic acids are NOT DETECTED. The SARS-CoV-2 RNA is generally detectable in upper and lower  respiratory specimens during the acute phase of infection. The lowest  concentration of SARS-CoV-2 viral copies this assay can detect is 250  copies / mL. A negative result does not preclude SARS-CoV-2 infection  and should not be used as the sole basis for treatment or other  patient management decisions.  A negative result may occur with  improper specimen collection / handling, submission of specimen other  than nasopharyngeal swab, presence of viral mutation(s) within  the  areas targeted by this assay, and inadequate number of viral copies  (<250 copies / mL). A negative result must be combined with clinical  observations, patient history, and epidemiological information. If result is POSITIVE SARS-CoV-2 target nucleic acids are DETECTE D. The SARS-CoV-2 RNA is generally detectable in upper and lower  respiratory specimens during the acute phase of infection.  Positive  results are indicative of active infection with SARS-CoV-2.  Clinical  correlation with patient history and other diagnostic information is  necessary to determine patient infection status.  Positive results do  not rule out bacterial infection or co-infection with other viruses. If result is PRESUMPTIVE POSTIVE SARS-CoV-2 nucleic acids MAY BE PRESENT.   A presumptive positive result was obtained on the submitted specimen  and confirmed on repeat testing.  While 2019 novel coronavirus  (SARS-CoV-2) nucleic acids may be present in the submitted sample  additional confirmatory testing may be necessary for epidemiological  and / or clinical management purposes  to  differentiate between  SARS-CoV-2 and other Sarbecovirus currently known to infect humans.  If clinically indicated additional testing with an alternate test  methodology (LAB745 3) is advised. The SARS-CoV-2 RNA is generally  detectable in upper and lower respiratory specimens during the acute  phase of infection. The expected result is Negative. Fact Sheet for Patients:  BoilerBrush.com.cyhttps://www.fda.gov/media/136312/download Fact Sheet for Healthcare Providers: https://pope.com/https://www.fda.gov/media/136313/download This test is not yet approved or cleared by the [place] FDA and has been authorized for detection and/or diagnosis of SARS-CoV-2 by FDA under an Emergency Use Authorization (EUA).  This EUA will remain in effect (meaning this test can be used) for the duration of the COVID-19 declaration under Section 564(b)(1) of the Act, 21 U.S.C. section 360bbb-3(b)(1), unless the authorization is terminated or revoked sooner. Performed at [facility], [address] [place], [place] [address]   SARS Coronavirus 2 (CEPHEID - Performed in [facility] hospital lab), Hosp Order     Status: Abnormal   Collection Time: 09/11/18 11:07 PM   Specimen: Nasal Mucosa; Nasopharyngeal  Result Value Ref Range Status   SARS Coronavirus 2 POSITIVE (A) NEGATIVE Final    Comment: CRITICAL RESULT CALLED TO, READ BACK BY AND VERIFIED WITH: RN J NASH AT 0112 09/12/18 CRUICKSHANK A (NOTE) If result is NEGATIVE SARS-CoV-2 target nucleic acids are NOT DETECTED. The SARS-CoV-2 RNA is generally detectable in upper and lower  respiratory specimens during the acute phase of infection. The lowest  concentration of SARS-CoV-2 viral copies this assay can detect is 250  copies / mL. A negative result does not preclude SARS-CoV-2 infection  and should not be used as the sole basis for treatment or other  patient management decisions.  A negative result may occur with  improper specimen collection / handling, submission of  specimen other  than nasopharyngeal swab, presence of viral mutation(s) within the  areas targeted by this assay, and inadequate number of viral copies  (<250 copies / mL). A negative result must be combined with clinical  observations, patient history, and epidemiological information. If result is POSITIVE SARS-CoV-2 target nucleic acids are  DETECTED. The SARS-CoV-2 RNA is generally detectable in upper and lower  respiratory specimens during the acute phase of infection.  Positive  results are indicative of active infection with SARS-CoV-2.  Clinical  correlation with patient history and other diagnostic information is  necessary to determine patient infection status.  Positive results do  not rule out bacterial infection or co-infection with other viruses.  If result is PRESUMPTIVE POSTIVE SARS-CoV-2 nucleic acids MAY BE PRESENT.   A presumptive positive result was obtained on the submitted specimen  and confirmed on repeat testing.  While 2019 novel coronavirus  (SARS-CoV-2) nucleic acids may be present in the submitted sample  additional confirmatory testing may be necessary for epidemiological  and / or clinical management purposes  to differentiate between  SARS-CoV-2 and other Sarbecovirus currently known to infect humans.  If clinically indicated additional testing with an alternate test  methodology  [id number]) is advised. The SARS-CoV-2 RNA is generally  detectable in upper and lower respiratory specimens during the acute  phase of infection. The expected result is Negative. Fact Sheet for Patients:  StrictlyIdeas.no Fact Sheet for Healthcare Providers: BankingDealers.co.za This test is not yet approved or cleared by the [place] FDA and has been authorized for detection and/or diagnosis of SARS-CoV-2 by FDA under an Emergency Use Authorization (EUA).  This EUA will remain in effect (meaning this test can be used) for the  duration of the COVID-19 declaration under Section 564(b)(1) of the Act, 21 U.S.C. section 360bbb-3(b)(1), unless the authorization is terminated or revoked sooner. Performed at [facility], [place] [address], [place], [place] [address]   Culture, Urine     Status: None   Collection Time: 09/13/18  3:35 AM   Specimen: Urine, Clean Catch  Result Value Ref Range Status   Specimen Description   Final    URINE, CLEAN CATCH Performed at [facility], [place] [address] [place], [place] [address]    Special Requests   Final    NONE Performed at [facility], [place] [address] [place], [place] [address]    Culture   Final    NO GROWTH Performed at [facility], [place] [address] [place],  [address]    Report Status 09/14/2018 FINAL  Final      Studies: No results found.  Scheduled Meds: . dexamethasone (DECADRON) injection  6 mg Intravenous Q24H  . dextromethorphan-guaiFENesin  1 tablet Oral BID  . famotidine  20 mg Oral Daily  . heparin  5,000 Units Subcutaneous Q8H  . insulin aspart  0-5 Units Subcutaneous QHS  . insulin aspart  0-9 Units Subcutaneous TID WC  . insulin aspart  3 Units Subcutaneous TID WC  . [START ON 09/15/2018] insulin glargine  20 Units Subcutaneous Daily  . isosorbide mononitrate  60 mg Oral Daily  . saccharomyces boulardii  250 mg Oral BID  . vitamin C  500 mg Oral Daily  . zinc sulfate  220 mg Oral Daily    Continuous Infusions:   LOS: 1 day     Annita Brod, MD [facility]  To reach me or the doctor on call, go to: [email or web address] Password [email or web address]  09/14/2018, 1:53 PM

## 2018-09-15 DIAGNOSIS — R112 Nausea with vomiting, unspecified: Secondary | ICD-10-CM

## 2018-09-15 DIAGNOSIS — U071 COVID-19: Principal | ICD-10-CM

## 2018-09-15 DIAGNOSIS — N179 Acute kidney failure, unspecified: Secondary | ICD-10-CM

## 2018-09-15 LAB — GLUCOSE, CAPILLARY
Glucose-Capillary: 310 mg/dL — ABNORMAL HIGH (ref 70–99)
Glucose-Capillary: 232 mg/dL — ABNORMAL HIGH (ref 70–99)

## 2018-09-15 LAB — C-REACTIVE PROTEIN: CRP: 4.3 mg/dL — ABNORMAL HIGH (ref <1.0)

## 2018-09-15 LAB — FERRITIN: Ferritin: 487 ng/mL — ABNORMAL HIGH (ref 24–336)

## 2018-09-15 MED ORDER — HYDROCHLOROTHIAZIDE 25 MG PO TABS
25.0000 mg | ORAL_TABLET | Freq: Every day | ORAL | Status: DC
  Administered 2018-09-15: 13:00:00 25 mg via ORAL
  Filled 2018-09-15: qty 1

## 2018-09-15 MED ORDER — DEXAMETHASONE 2 MG PO TABS: ORAL_TABLET | ORAL | 0 refills | Status: DC

## 2018-09-15 MED ORDER — LISINOPRIL-HYDROCHLOROTHIAZIDE 20-25 MG PO TABS: 1.0000 | ORAL_TABLET | Freq: Every day | ORAL | Status: DC

## 2018-09-15 MED ORDER — LISINOPRIL 20 MG PO TABS
20.0000 mg | ORAL_TABLET | Freq: Every day | ORAL | Status: DC
  Administered 2018-09-15: 20 mg via ORAL
  Filled 2018-09-15: qty 1

## 2018-09-15 MED ORDER — HYDRALAZINE HCL 20 MG/ML IJ SOLN: 10.0000 mg | Freq: Four times a day (QID) | INTRAMUSCULAR | Status: DC | PRN

## 2018-09-15 MED FILL — DEXAMETHASONE 2 MG TABLET: 2 | 8 days supply | Qty: 13 | Fill #0

## 2018-09-15 NOTE — Progress Notes (Signed)
Inpatient Diabetes Program Recommendations  AACE/ADA: New Consensus Statement on Inpatient Glycemic Control (2015)  Target Ranges:  Prepandial:   less than 140 mg/dL      Peak postprandial:   less than 180 mg/dL (1-2 hours)      Critically ill patients:  140 - 180 mg/dL   Lab Results  Component Value Date   GLUCAP 232 (H) 09/15/2018   HGBA1C 12.0 (H) 05/22/2018    Review of Glycemic Control Results for Jerry Arnold, Jerry Arnold (MRN [id number]) as of 09/15/2018 13:15  Ref. Range 09/14/2018 07:42 09/14/2018 12:03 09/14/2018 16:17 09/14/2018 20:55 09/15/2018 08:11 09/15/2018 12:08  Glucose-Capillary Latest Ref Range: 70 - 99 mg/dL 292 (H) 233 (H) 174 (H) 355 (H) 310 (H) 232 (H)   Diabetes history: DM 2 Outpatient Diabetes medications: Novolin 70/30 23 units bid Current orders for Inpatient glycemic control:  Novolog sensitive tid with meals and HS Decadron 6 mg q 24 hours Lantus 20 units daily Novolog 3 units tid with meals Inpatient Diabetes Program Recommendations:    May consider increasing Lantus to 30 units daily.  Also consider increasing Novolog meal coverage to 6 units tid with meals.   Thanks,  Adah Perl, RN, BC-ADM Inpatient Diabetes Coordinator Pager [phone] (8a-5p)

## 2018-09-15 NOTE — Discharge Summary (Signed)
Discharge Summary  Jerry RangSamuel Arnold ZOX:[id number] DOB: 1985-06-03  PCP: Hoy RegisterNewlin, Enobong, MD  Admit date: 09/11/2018 Discharge date: 09/15/2018  Time spent: 25 minutes  Recommendations for Outpatient Follow-up:  New medication: Decadron taper over the next 10 days Patient will follow-up with his PCP in the next 4 to 6 weeks  Discharge Diagnoses:  Active Hospital Problems   Diagnosis Date Noted   COVID-19 virus infection 09/11/2018   ARF (acute renal failure) (HCC) 09/11/2018   Nausea and vomiting 04/16/2017   Insulin dependent diabetes mellitus (HCC) 04/11/2017    Resolved Hospital Problems  No resolved problems to display.    Discharge Condition: Improved, being discharged home  Diet recommendation: Low-sodium, carb modified  Vitals:   09/15/18 1000 09/15/18 1100  BP:    Pulse: 91 78  Resp:    Temp:    SpO2: 100% 98%    History of present illness:  This is a 33 year old male with poorly controlled diabetes mellitus with hyperglycemia, chronic kidney disease stage III with baseline creatinine around 1.6, who presents to the hospital and is admitted on 09/11/2018 with nausea, vomiting, poor p.o. intake along with loss of taste and smell. He was also complaining of fever, chills, generalized weakness, body aches. He initially presented to the ED on 7/22 and left AMA. He came back a day later due to progression of the symptoms and severe generalized weakness, he was diagnosed with coronavirus and was [facility] then transferred to [facility] as abed became available. Denies any known sick contacts but recently traveled to [place] with several friends and stayed there for 5 days.   Hospital Course:  Principal Problem:   COVID-19 virus infection Active Problems:   Insulin dependent diabetes mellitus (HCC)   Nausea and vomiting   ARF (acute renal failure) (HCC) COVID-19 virus infection: Over the next few days, patient responded well.  Fortunately, no  evidence of hypoxia.  Doing well.  On steroids.  Inflammatory markers still quite elevated.  CRP at 7.7 and ferritin at 562.  Continue to follow.    By day of discharge, ferritin at 47 and CRP at 4.3.  Patient feeling fine.  No hypoxia, no fever x3 days.   Active Problems:    Nausea and vomiting: Secondary to impart COVID symptoms plus poorly controlled diabetes mellitus, better.  Tolerating p.o.    ARF (acute renal failure) (HCC) in the setting of stage III chronic kidney disease secondary to uncontrolled insulin-dependent diabetes mellitus with complications: Renal function now back to baseline following IV fluids.  Will increase Lantus today to cover for additional hyperglycemia brought on by steroids with underlying lack of control.  Patient's insulin 70/30 twice a day at a higher rate and should easily cover his hyperglycemia brought on by steroids as long as he is compliant with this.  Essential hypertension: Continue Imdur, blood pressure trending upward and so lisinopril/HCTZ resume by 7/27  Morbid obesity: Patient meets criteria with BMI greater than 35+ diabetes and hypertension  Procedures:  None  Consultations:  None  Discharge Exam: BP 136/89 (BP Location: Left Arm)    Pulse 78    Temp 97.6 F (36.4 C) (Oral)    Resp 20    Ht 6\' 1"  (1.854 m)    Wt 120.7 kg    SpO2 98%    BMI 35.09 kg/m   General: Alert and oriented x3, no acute distress Cardiovascular: Regular rate and rhythm, S1-S2 Respiratory: Clear to auscultation bilaterally  Discharge Instructions You  were cared for by a hospitalist during your hospital stay. If you have any questions about your discharge medications or the care you received while you were in the hospital after you are discharged, you can call the unit and asked to speak with the hospitalist on call if the hospitalist that took care of you is not available. Once you are discharged, your primary care physician will handle any further medical issues.  Please note that NO REFILLS for any discharge medications will be authorized once you are discharged, as it is imperative that you return to your primary care physician (or establish a relationship with a primary care physician if you do not have one) for your aftercare needs so that they can reassess your need for medications and monitor your lab values.  Discharge Instructions    Diet - low sodium heart healthy   Complete by: As directed    Diet Carb Modified   Complete by: As directed    Increase activity slowly   Complete by: As directed      Allergies as of 09/15/2018   No Known Allergies     Medication List    STOP taking these medications   liraglutide 18 MG/3ML Sopn Commonly known as: Victoza     TAKE these medications   acetaminophen 500 MG tablet Commonly known as: TYLENOL Take 1 tablet (500 mg total) by mouth every 6 (six) hours as needed. What changed: reasons to take this   acyclovir 400 MG tablet Commonly known as: ZOVIRAX Take 1 tablet (400 mg total) by mouth 4 (four) times daily.   dexamethasone 2 MG tablet Commonly known as: DECADRON 6 mg (3 tabs) for 2 days, then 4mg  for 2 days, then 2 mg for 2 days, then 1 mg for 2 days then stop   insulin NPH-regular Human (70-30) 100 UNIT/ML injection Inject 23 Units into the skin 2 (two) times daily with a meal.   Insulin Pen Needle 32G X 4 MM Misc Commonly known as: TRUEplus Pen Needles Use as directed to inject Victoza.   Insulin Syringe-Needle U-100 31G X 5/16" 0.3 ML Misc Commonly known as: TRUEplus Insulin Syringe Use as directed to administer insulin twice daily   isosorbide mononitrate 60 MG 24 hr tablet Commonly known as: IMDUR Take 1 tablet (60 mg total) by mouth daily.   lisinopril-hydrochlorothiazide 20-25 MG tablet Commonly known as: ZESTORETIC Take 1 tablet by mouth daily.   ondansetron 4 MG tablet Commonly known as: Zofran Take 1 tablet (4 mg total) by mouth every 8 (eight) hours as needed  for nausea or vomiting.   promethazine 25 MG tablet Commonly known as: PHENERGAN Take 1 tablet (25 mg total) by mouth every 6 (six) hours as needed for nausea.      No Known Allergies Follow-up Information    Hoy RegisterNewlin, Enobong, MD Follow up in 6 week(s).   Specialty: Family Medicine Contact information: [address] [place] [address] [phone]            The results of significant diagnostics from this hospitalization (including imaging, microbiology, ancillary and laboratory) are listed below for reference.    Significant Diagnostic Studies: Dg Chest Portable 1 View  Result Date: 09/10/2018 CLINICAL DATA:  Lightheadedness. COVID-19 positive. Nausea and vomiting. EXAM: PORTABLE CHEST 1 VIEW COMPARISON:  April 18, 2016 FINDINGS: Mild opacity in the right base has platelike components. The heart, hila, mediastinum, lungs, and pleura are otherwise unremarkable. IMPRESSION: Mild opacity in the right base has platelike  components suggesting possible atelectasis. Early infiltrate not excluded. Recommend follow-up to resolution. Electronically Signed   By: Dorise Bullion III M.D   On: 09/10/2018 08:52    Microbiology: Recent Results (from the past 240 hour(s))  SARS Coronavirus 2 (CEPHEID- Performed in [facility] hospital lab), Hosp Order     Status: Abnormal   Collection Time: 09/07/18  1:36 PM   Specimen: Nasopharyngeal Swab  Result Value Ref Range Status   SARS Coronavirus 2 POSITIVE (A) NEGATIVE Final    Comment: CRITICAL RESULT CALLED TO, READ BACK BY AND VERIFIED WITH: RN CRIS CHRISCO [id number] FCP (NOTE) If result is NEGATIVE SARS-CoV-2 target nucleic acids are NOT DETECTED. The SARS-CoV-2 RNA is generally detectable in upper and lower  respiratory specimens during the acute phase of infection. The lowest  concentration of SARS-CoV-2 viral copies this assay can detect is 250  copies / mL. A negative result does not preclude SARS-CoV-2 infection  and  should not be used as the sole basis for treatment or other  patient management decisions.  A negative result may occur with  improper specimen collection / handling, submission of specimen other  than nasopharyngeal swab, presence of viral mutation(s) within the  areas targeted by this assay, and inadequate number of viral copies  (<250 copies / mL). A negative result must be combined with clinical  observations, patient history, and epidemiological information. If result is POSITIVE SARS-CoV-2 target nucleic acids are DETECTE D. The SARS-CoV-2 RNA is generally detectable in upper and lower  respiratory specimens during the acute phase of infection.  Positive  results are indicative of active infection with SARS-CoV-2.  Clinical  correlation with patient history and other diagnostic information is  necessary to determine patient infection status.  Positive results do  not rule out bacterial infection or co-infection with other viruses. If result is PRESUMPTIVE POSTIVE SARS-CoV-2 nucleic acids MAY BE PRESENT.   A presumptive positive result was obtained on the submitted specimen  and confirmed on repeat testing.  While 2019 novel coronavirus  (SARS-CoV-2) nucleic acids may be present in the submitted sample  additional confirmatory testing may be necessary for epidemiological  and / or clinical management purposes  to differentiate between  SARS-CoV-2 and other Sarbecovirus currently known to infect humans.  If clinically indicated additional testing with an alternate test  methodology (LAB745 3) is advised. The SARS-CoV-2 RNA is generally  detectable in upper and lower respiratory specimens during the acute  phase of infection. The expected result is Negative. Fact Sheet for Patients:  StrictlyIdeas.no Fact Sheet for Healthcare Providers: BankingDealers.co.za This test is not yet approved or cleared by the [place] FDA and has been  authorized for detection and/or diagnosis of SARS-CoV-2 by FDA under an Emergency Use Authorization (EUA).  This EUA will remain in effect (meaning this test can be used) for the duration of the COVID-19 declaration under Section 564(b)(1) of the Act, 21 U.S.C. section 360bbb-3(b)(1), unless the authorization is terminated or revoked sooner. Performed at [facility], [place] [address], [place], [place] [address]   SARS Coronavirus 2 (CEPHEID - Performed in [facility] hospital lab), Hosp Order     Status: Abnormal   Collection Time: 09/11/18 11:07 PM   Specimen: Nasal Mucosa; Nasopharyngeal  Result Value Ref Range Status   SARS Coronavirus 2 POSITIVE (A) NEGATIVE Final    Comment: CRITICAL RESULT CALLED TO, READ BACK BY AND VERIFIED WITH: RN J NASH AT 0112 09/12/18 CRUICKSHANK A (NOTE) If result  is NEGATIVE SARS-CoV-2 target nucleic acids are NOT DETECTED. The SARS-CoV-2 RNA is generally detectable in upper and lower  respiratory specimens during the acute phase of infection. The lowest  concentration of SARS-CoV-2 viral copies this assay can detect is 250  copies / mL. A negative result does not preclude SARS-CoV-2 infection  and should not be used as the sole basis for treatment or other  patient management decisions.  A negative result may occur with  improper specimen collection / handling, submission of specimen other  than nasopharyngeal swab, presence of viral mutation(s) within the  areas targeted by this assay, and inadequate number of viral copies  (<250 copies / mL). A negative result must be combined with clinical  observations, patient history, and epidemiological information. If result is POSITIVE SARS-CoV-2 target nucleic acids are  DETECTED. The SARS-CoV-2 RNA is generally detectable in upper and lower  respiratory specimens during the acute phase of infection.  Positive  results are indicative of active infection with SARS-CoV-2.  Clinical  correlation with  patient history and other diagnostic information is  necessary to determine patient infection status.  Positive results do  not rule out bacterial infection or co-infection with other viruses. If result is PRESUMPTIVE POSTIVE SARS-CoV-2 nucleic acids MAY BE PRESENT.   A presumptive positive result was obtained on the submitted specimen  and confirmed on repeat testing.  While 2019 novel coronavirus  (SARS-CoV-2) nucleic acids may be present in the submitted sample  additional confirmatory testing may be necessary for epidemiological  and / or clinical management purposes  to differentiate between  SARS-CoV-2 and other Sarbecovirus currently known to infect humans.  If clinically indicated additional testing with an alternate test  methodology  [phone]) is advised. The SARS-CoV-2 RNA is generally  detectable in upper and lower respiratory specimens during the acute  phase of infection. The expected result is Negative. Fact Sheet for Patients:  BoilerBrush.com.cy Fact Sheet for Healthcare Providers: https://pope.com/ This test is not yet approved or cleared by the [place] FDA and has been authorized for detection and/or diagnosis of SARS-CoV-2 by FDA under an Emergency Use Authorization (EUA).  This EUA will remain in effect (meaning this test can be used) for the duration of the COVID-19 declaration under Section 564(b)(1) of the Act, 21 U.S.C. section 360bbb-3(b)(1), unless the authorization is terminated or revoked sooner. Performed at [facility], [address] [place], [place] [address]   Culture, Urine     Status: None   Collection Time: 09/13/18  3:35 AM   Specimen: Urine, Clean Catch  Result Value Ref Range Status   Specimen Description   Final    URINE, CLEAN CATCH Performed at [facility], [address], [place], [place] [address]    Special Requests   Final     NONE Performed at [facility], [address], [place], [place] [address]    Culture   Final    NO GROWTH Performed at [facility], [address], [place], [place] [address]    Report Status 09/14/2018 FINAL  Final     Labs: Basic Metabolic Panel: Recent Labs  Lab 09/10/18 0525 09/10/18 1129 09/11/18 2122 09/11/18 2249 09/13/18 0940 09/14/18 0414  NA 137 138 136  --  138 137  K 4.4 4.2 4.2  --  5.1 4.8  CL 103 104 101  --  104 106  CO2 23 25 20*  --  23 20*  GLUCOSE 149*  92 142*  --  317* 314*  BUN 30* 30* 29*  --  24* 27*  CREATININE 2.43* 2.22* 2.20* 2.26* 1.71* 1.34*  CALCIUM 8.4* 8.1* 8.4*  --  8.5* 8.9  MG  --   --   --   --   --  2.0   Liver Function Tests: Recent Labs  Lab 09/10/18 0525 09/11/18 2122 09/13/18 0940 09/14/18 0414  AST 26 31 38 28  ALT ALKPHOS 78 75 79 71  BILITOT 0.5 0.4 0.4 0.4  PROT 8.2* 7.9 8.2* 7.9  ALBUMIN 3.9 3.6 3.5 3.3*   Recent Labs  Lab 09/10/18 0525  LIPASE 40   No results for input(s): AMMONIA in the last 168 hours. CBC: Recent Labs  Lab 09/10/18 0525 09/11/18 2122 09/11/18 2249 09/13/18 0940 09/14/18 0414  WBC 4.6 4.8  --  4.5 4.8  NEUTROABS 2.7 2.9  --   --   --   HGB 12.3* 12.7* 11.9* 11.8* 11.6*  HCT 38.3* 39.9  --  36.9* 36.3*  MCV 88.7 88.1  --  87.9 87.1  PLT 151 173  --  203 226   Cardiac Enzymes: Recent Labs  Lab 09/12/18 0030  CKTOTAL 495*  CKMB 0.8   BNP: BNP (last 3 results) No results for input(s): BNP in the last 8760 hours.  ProBNP (last 3 results) No results for input(s): PROBNP in the last 8760 hours.  CBG: Recent Labs  Lab 09/14/18 1203 09/14/18 1617 09/14/18 2055 09/15/18 0811 09/15/18 1208  GLUCAP 233* 174* 355* 310* 232*       Signed:  Hollice Espy, MD [facility] 09/15/2018, 2:01 PM

## 2018-09-15 NOTE — Discharge Instructions (Signed)
COVID-19 Frequently Asked Questions °COVID-19 (coronavirus disease) is an infection that is caused by a large family of viruses. Some viruses cause illness in people and others cause illness in animals like camels, cats, and bats. In some cases, the viruses that cause illness in animals can spread to humans. °Where did the coronavirus come from? °In December 2019, [place] told the World Health Organization (WHO) of several cases of lung disease (human respiratory illness). These cases were linked to an open seafood and livestock market in the city of [place]. The link to the seafood and livestock market suggests that the virus may have spread from animals to humans. However, since that first outbreak in December, the virus has also been shown to spread from person to person. °What is the name of the disease and the virus? °Disease name °Early on, this disease was called novel coronavirus. This is because scientists determined that the disease was caused by a new (novel) respiratory virus. The World Health Organization (WHO) has now named the disease COVID-19, or coronavirus disease. °Virus name °The virus that causes the disease is called severe acute respiratory syndrome coronavirus 2 (SARS-CoV-2). °More information on disease and virus naming °World Health Organization (WHO): www.who.int/emergencies/diseases/novel-coronavirus-2019/technical-guidance/naming-the-coronavirus-disease-(covid-2019)-and-the-virus-that-causes-it °Who is at risk for complications from coronavirus disease? °Some people may be at higher risk for complications from coronavirus disease. This includes older adults and people who have chronic diseases, such as heart disease, diabetes, and lung disease. °If you are at higher risk for complications, take these extra precautions: °· Avoid close contact with people who are sick or have a fever or cough. Stay at least 3-6 ft (1-2 m) away from them, if possible. °· Wash your hands often with soap and  water for at least 20 seconds. °· Avoid touching your face, mouth, nose, or eyes. °· Keep supplies on hand at home, such as food, medicine, and cleaning supplies. °· Stay home as much as possible. °· Avoid social gatherings and travel. °How does coronavirus disease spread? °The virus that causes coronavirus disease spreads easily from person to person (is contagious). There are also cases of community-spread disease. This means the disease has spread to: °· People who have no known contact with other infected people. °· People who have not traveled to areas where there are known cases. °It appears to spread from one person to another through droplets from coughing or sneezing. °Can I get the virus from touching surfaces or objects? °There is still a lot that we do not know about the virus that causes coronavirus disease. Scientists are basing a lot of information on what they know about similar viruses, such as: °· Viruses cannot generally survive on surfaces for long. They need a human body (host) to survive. °· It is more likely that the virus is spread by close contact with people who are sick (direct contact), such as through: °? Shaking hands or hugging. °? Breathing in respiratory droplets that travel through the air. This can happen when an infected person coughs or sneezes on or near other people. °· It is less likely that the virus is spread when a person touches a surface or object that has the virus on it (indirect contact). The virus may be able to enter the body if the person touches a surface or object and then touches his or her face, eyes, nose, or mouth. °Can a person spread the virus without having symptoms of the disease? °It may be possible for the virus to spread before a person   has symptoms of the disease, but this is most likely not the main way the virus is spreading. It is more likely for the virus to spread by being in close contact with people who are sick and breathing in the respiratory  droplets of a sick person's cough or sneeze. °What are the symptoms of coronavirus disease? °Symptoms vary from person to person and can range from mild to severe. Symptoms may include: °· Fever. °· Cough. °· Tiredness, weakness, or fatigue. °· Fast breathing or feeling short of breath. °These symptoms can appear anywhere from 2 to 14 days after you have been exposed to the virus. If you develop symptoms, call your health care provider. People with severe symptoms may need hospital care. °If I am exposed to the virus, how long does it take before symptoms start? °Symptoms of coronavirus disease may appear anywhere from 2 to 14 days after a person has been exposed to the virus. If you develop symptoms, call your health care provider. °Should I be tested for this virus? °Your health care provider will decide whether to test you based on your symptoms, history of exposure, and your risk factors. °How does a health care provider test for this virus? °Health care providers will collect samples to send for testing. Samples may include: °· Taking a swab of fluid from the nose. °· Taking fluid from the lungs by having you cough up mucus (sputum) into a sterile cup. °· Taking a blood sample. °· Taking a stool or urine sample. °Is there a treatment or vaccine for this virus? °Currently, there is no vaccine to prevent coronavirus disease. Also, there are no medicines like antibiotics or antivirals to treat the virus. A person who becomes sick is given supportive care, which means rest and fluids. A person may also relieve his or her symptoms by using over-the-counter medicines that treat sneezing, coughing, and runny nose. These are the same medicines that a person takes for the common cold. °If you develop symptoms, call your health care provider. People with severe symptoms may need hospital care. °What can I do to protect myself and my family from this virus? ° °  ° °You can protect yourself and your family by taking the  same actions that you would take to prevent the spread of other viruses. Take the following actions: °· Wash your hands often with soap and water for at least 20 seconds. If soap and water are not available, use alcohol-based hand sanitizer. °· Avoid touching your face, mouth, nose, or eyes. °· Cough or sneeze into a tissue, sleeve, or elbow. Do not cough or sneeze into your hand or the air. °? If you cough or sneeze into a tissue, throw it away immediately and wash your hands. °· Disinfect objects and surfaces that you frequently touch every day. °· Avoid close contact with people who are sick or have a fever or cough. Stay at least 3-6 ft (1-2 m) away from them, if possible. °· Stay home if you are sick, except to get medical care. Call your health care provider before you get medical care. °· Make sure your vaccines are up to date. Ask your health care provider what vaccines you need. °What should I do if I need to travel? °Follow travel recommendations from your local health authority, the CDC, and WHO. °Travel information and advice °· Centers for Disease Control and Prevention (CDC): www.cdc.gov/coronavirus/2019-ncov/travelers/index.html °· World Health Organization (WHO): www.who.int/emergencies/diseases/novel-coronavirus-2019/travel-advice °Know the risks and take action to protect your health °·   You are at higher risk of getting coronavirus disease if you are traveling to areas with an outbreak or if you are exposed to travelers from areas with an outbreak. °· Wash your hands often and practice good hygiene to lower the risk of catching or spreading the virus. °What should I do if I am sick? °General instructions to stop the spread of infection °· Wash your hands often with soap and water for at least 20 seconds. If soap and water are not available, use alcohol-based hand sanitizer. °· Cough or sneeze into a tissue, sleeve, or elbow. Do not cough or sneeze into your hand or the air. °· If you cough or  sneeze into a tissue, throw it away immediately and wash your hands. °· Stay home unless you must get medical care. Call your health care provider or local health authority before you get medical care. °· Avoid public areas. Do not take public transportation, if possible. °· If you can, wear a mask if you must go out of the house or if you are in close contact with someone who is not sick. °Keep your home clean °· Disinfect objects and surfaces that are frequently touched every day. This may include: °? Counters and tables. °? Doorknobs and light switches. °? Sinks and faucets. °? Electronics such as phones, remote controls, keyboards, computers, and tablets. °· Wash dishes in hot, soapy water or use a dishwasher. Air-dry your dishes. °· Wash laundry in hot water. °Prevent infecting other household members °· Let healthy household members care for children and pets, if possible. If you have to care for children or pets, wash your hands often and wear a mask. °· Sleep in a different bedroom or bed, if possible. °· Do not share personal items, such as razors, toothbrushes, deodorant, combs, brushes, towels, and washcloths. °Where to find more information °Centers for Disease Control and Prevention (CDC) °· Information and news updates: www.cdc.gov/coronavirus/2019-ncov °World Health Organization (WHO) °· Information and news updates: www.who.int/emergencies/diseases/novel-coronavirus-2019 °· Coronavirus health topic: www.who.int/health-topics/coronavirus °· Questions and answers on COVID-19: www.who.int/news-room/q-a-detail/q-a-coronaviruses °· Global tracker: who.sprinklr.com °American Academy of Pediatrics (AAP) °· Information for families: www.healthychildren.org/English/health-issues/conditions/chest-lungs/Pages/2019-Novel-Coronavirus.aspx °The coronavirus situation is changing rapidly. Check your local health authority website or the CDC and WHO websites for updates and news. °When should I contact a health care  provider? °· Contact your health care provider if you have symptoms of an infection, such as fever or cough, and you: °? Have been near anyone who is known to have coronavirus disease. °? Have come into contact with a person who is suspected to have coronavirus disease. °? Have traveled outside of the country. °When should I get emergency medical care? °· Get help right away by calling your local emergency services (911 in the [place] if you have: °? Trouble breathing. °? Pain or pressure in your chest. °? Confusion. °? Blue-tinged lips and fingernails. °? Difficulty waking from sleep. °? Symptoms that get worse. °Let the emergency medical personnel know if you think you have coronavirus disease. °Summary °· A new respiratory virus is spreading from person to person and causing COVID-19 (coronavirus disease). °· The virus that causes COVID-19 appears to spread easily. It spreads from one person to another through droplets from coughing or sneezing. °· Older adults and those with chronic diseases are at higher risk of disease. If you are at higher risk for complications, take extra precautions. °· There is currently no vaccine to prevent coronavirus disease. There are no medicines, such as antibiotics or   antivirals, to treat the virus. °· You can protect yourself and your family by washing your hands often, avoiding touching your face, and covering your coughs and sneezes. °This information is not intended to replace advice given to you by your health care provider. Make sure you discuss any questions you have with your health care provider. °Document Released: 06/03/2018 Document Revised: 06/03/2018 Document Reviewed: 06/03/2018 °Elsevier Patient Education © 2020 [employer] ° ° °COVID-19 °COVID-19 is a respiratory infection that is caused by a virus called severe acute respiratory syndrome coronavirus 2 (SARS-CoV-2). The disease is also known as coronavirus disease or novel coronavirus. In some people, the virus  may not cause any symptoms. In others, it may cause a serious infection. The infection can get worse quickly and can lead to complications, such as: °· Pneumonia, or infection of the lungs. °· Acute respiratory distress syndrome or ARDS. This is fluid build-up in the lungs. °· Acute respiratory failure. This is a condition in which there is not enough oxygen passing from the lungs to the body. °· Sepsis or septic shock. This is a serious bodily reaction to an infection. °· Blood clotting problems. °· Secondary infections due to bacteria or fungus. °The virus that causes COVID-19 is contagious. This means that it can spread from person to person through droplets from coughs and sneezes (respiratory secretions). °What are the causes? °This illness is caused by a virus. You may catch the virus by: °· Breathing in droplets from an infected person's cough or sneeze. °· Touching something, like a table or a doorknob, that was exposed to the virus (contaminated) and then touching your mouth, nose, or eyes. °What increases the risk? °Risk for infection °You are more likely to be infected with this virus if you: °· Live in or travel to an area with a COVID-19 outbreak. °· Come in contact with a sick person who recently traveled to an area with a COVID-19 outbreak. °· Provide care for or live with a person who is infected with COVID-19. °Risk for serious illness °You are more likely to become seriously ill from the virus if you: °· Are 65 years of age or older. °· Have a long-term disease that lowers your body's ability to fight infection (immunocompromised). °· Live in a nursing home or long-term care facility. °· Have a long-term (chronic) disease such as: °? Chronic lung disease, including chronic obstructive pulmonary disease or asthma °? Heart disease. °? Diabetes. °? Chronic kidney disease. °? Liver disease. °· Are obese. °What are the signs or symptoms? °Symptoms of this condition can range from mild to severe.  Symptoms may appear any time from 2 to 14 days after being exposed to the virus. They include: °· A fever. °· A cough. °· Difficulty breathing. °· Chills. °· Muscle pains. °· A sore throat. °· Loss of taste or smell. °Some people may also have stomach problems, such as nausea, vomiting, or diarrhea. °Other people may not have any symptoms of COVID-19. °How is this diagnosed? °This condition may be diagnosed based on: °· Your signs and symptoms, especially if: °? You live in an area with a COVID-19 outbreak. °? You recently traveled to or from an area where the virus is common. °? You provide care for or live with a person who was diagnosed with COVID-19. °· A physical exam. °· Lab tests, which may include: °? A nasal swab to take a sample of fluid from your nose. °? A throat swab to take a sample of   fluid from your throat. °? A sample of mucus from your lungs (sputum). °? Blood tests. °· Imaging tests, which may include, X-rays, CT scan, or ultrasound. °How is this treated? °At present, there is no medicine to treat COVID-19. Medicines that treat other diseases are being used on a trial basis to see if they are effective against COVID-19. °Your health care provider will talk with you about ways to treat your symptoms. For most people, the infection is mild and can be managed at home with rest, fluids, and over-the-counter medicines. °Treatment for a serious infection usually takes places in a hospital intensive care unit (ICU). It may include one or more of the following treatments. These treatments are given until your symptoms improve. °· Receiving fluids and medicines through an IV. °· Supplemental oxygen. Extra oxygen is given through a tube in the nose, a face mask, or a hood. °· Positioning you to lie on your stomach (prone position). This makes it easier for oxygen to get into the lungs. °· Continuous positive airway pressure (CPAP) or bi-level positive airway pressure (BPAP) machine. This treatment uses mild  air pressure to keep the airways open. A tube that is connected to a motor delivers oxygen to the body. °· Ventilator. This treatment moves air into and out of the lungs by using a tube that is placed in your windpipe. °· Tracheostomy. This is a procedure to create a hole in the neck so that a breathing tube can be inserted. °· Extracorporeal membrane oxygenation (ECMO). This procedure gives the lungs a chance to recover by taking over the functions of the heart and lungs. It supplies oxygen to the body and removes carbon dioxide. °Follow these instructions at home: °Lifestyle °· If you are sick, stay home except to get medical care. Your health care provider will tell you how long to stay home. Call your health care provider before you go for medical care. °· Rest at home as told by your health care provider. °· Do not use any products that contain nicotine or tobacco, such as cigarettes, e-cigarettes, and chewing tobacco. If you need help quitting, ask your health care provider. °· Return to your normal activities as told by your health care provider. Ask your health care provider what activities are safe for you. °General instructions °· Take over-the-counter and prescription medicines only as told by your health care provider. °· Drink enough fluid to keep your urine pale yellow. °· Keep all follow-up visits as told by your health care provider. This is important. °How is this prevented? ° °There is no vaccine to help prevent COVID-19 infection. However, there are steps you can take to protect yourself and others from this virus. °To protect yourself:  °· Do not travel to areas where COVID-19 is a risk. The areas where COVID-19 is reported change often. To identify high-risk areas and travel restrictions, check the CDC travel website: wwwnc.cdc.gov/travel/notices °· If you live in, or must travel to, an area where COVID-19 is a risk, take precautions to avoid infection. °? Stay away from people who are  sick. °? Wash your hands often with soap and water for 20 seconds. If soap and water are not available, use an alcohol-based hand sanitizer. °? Avoid touching your mouth, face, eyes, or nose. °? Avoid going out in public, follow guidance from your state and local health authorities. °? If you must go out in public, wear a cloth face covering or face mask. °? Disinfect objects and surfaces that are   frequently touched every day. This may include: °§ Counters and tables. °§ Doorknobs and light switches. °§ Sinks and faucets. °§ Electronics, such as phones, remote controls, keyboards, computers, and tablets. °To protect others: °If you have symptoms of COVID-19, take steps to prevent the virus from spreading to others. °· If you think you have a COVID-19 infection, contact your health care provider right away. Tell your health care team that you think you may have a COVID-19 infection. °· Stay home. Leave your house only to seek medical care. Do not use public transport. °· Do not travel while you are sick. °· Wash your hands often with soap and water for 20 seconds. If soap and water are not available, use alcohol-based hand sanitizer. °· Stay away from other members of your household. Let healthy household members care for children and pets, if possible. If you have to care for children or pets, wash your hands often and wear a mask. If possible, stay in your own room, separate from others. Use a different bathroom. °· Make sure that all people in your household wash their hands well and often. °· Cough or sneeze into a tissue or your sleeve or elbow. Do not cough or sneeze into your hand or into the air. °· Wear a cloth face covering or face mask. °Where to find more information °· Centers for Disease Control and Prevention: www.cdc.gov/coronavirus/2019-ncov/index.html °· World Health Organization: www.who.int/health-topics/coronavirus °Contact a health care provider if: °· You live in or have traveled to an area  where COVID-19 is a risk and you have symptoms of the infection. °· You have had contact with someone who has COVID-19 and you have symptoms of the infection. °Get help right away if: °· You have trouble breathing. °· You have pain or pressure in your chest. °· You have confusion. °· You have bluish lips and fingernails. °· You have difficulty waking from sleep. °· You have symptoms that get worse. °These symptoms may represent a serious problem that is an emergency. Do not wait to see if the symptoms will go away. Get medical help right away. Call your local emergency services (911 in the [place] Do not drive yourself to the hospital. Let the emergency medical personnel know if you think you have COVID-19. °Summary °· COVID-19 is a respiratory infection that is caused by a virus. It is also known as coronavirus disease or novel coronavirus. It can cause serious infections, such as pneumonia, acute respiratory distress syndrome, acute respiratory failure, or sepsis. °· The virus that causes COVID-19 is contagious. This means that it can spread from person to person through droplets from coughs and sneezes. °· You are more likely to develop a serious illness if you are 65 years of age or older, have a weak immunity, live in a nursing home, or have chronic disease. °· There is no medicine to treat COVID-19. Your health care provider will talk with you about ways to treat your symptoms. °· Take steps to protect yourself and others from infection. Wash your hands often and disinfect objects and surfaces that are frequently touched every day. Stay away from people who are sick and wear a mask if you are sick. °This information is not intended to replace advice given to you by your health care provider. Make sure you discuss any questions you have with your health care provider. °Document Released: 03/13/2018 Document Revised: 07/03/2018 Document Reviewed: 03/13/2018 °Elsevier Patient Education © 2020 [employer] ° °

## 2018-09-15 NOTE — Progress Notes (Signed)
Discharge instructions (including medications) discussed with and copy provided to patient/caregiver 

## 2018-09-18 MED FILL — LISINOPRIL-HCTZ 20-25 MG TA: 20-25 | 30 days supply | Qty: 30 | Fill #8

## 2018-09-18 MED FILL — ISOSORBIDE MN ER 60 MG TAB: 60 | 30 days supply | Qty: 30 | Fill #0

## 2018-09-18 MED FILL — $novoLIN 70/30 100 UNITS/ML: (70-30) 100 | 43 days supply | Qty: 20 | Fill #7

## 2018-10-16 MED FILL — ISOSORBIDE MN ER 60 MG TAB: 60 | 30 days supply | Qty: 30 | Fill #1

## 2018-10-16 MED FILL — LISINOPRIL-HCTZ 20-25 MG TA: 20-25 | 30 days supply | Qty: 30 | Fill #9

## 2018-11-17 MED FILL — !NOVOLIN 70/30 100 UNITS/ML: (70-30) 100 | 63 days supply | Qty: 30 | Fill #0

## 2018-11-17 MED FILL — LISINOPRIL-HCTZ 20-25 MG TA: 20-25 | 30 days supply | Qty: 30 | Fill #10

## 2018-11-17 MED FILL — ISOSORBIDE MN ER 60 MG TAB: 60 | 30 days supply | Qty: 30 | Fill #2

## 2018-12-19 MED FILL — ISOSORBIDE MN ER 60 MG TAB: 60 | 30 days supply | Qty: 30 | Fill #3

## 2018-12-19 MED FILL — LISINOPRIL-HCTZ 20-25 MG TA: 20-25 | 30 days supply | Qty: 30 | Fill #11

## 2019-01-23 ENCOUNTER — Other Ambulatory Visit: Payer: Self-pay | Admitting: Family Medicine

## 2019-01-23 DIAGNOSIS — I10 Essential (primary) hypertension: Secondary | ICD-10-CM

## 2019-01-23 MED FILL — $novoLIN 70/30 100 UNITS/ML: (70-30) 100 | 84 days supply | Qty: 40 | Fill #1

## 2019-01-27 ENCOUNTER — Other Ambulatory Visit: Payer: Self-pay | Admitting: Family Medicine

## 2019-01-27 DIAGNOSIS — I10 Essential (primary) hypertension: Secondary | ICD-10-CM

## 2019-02-05 ENCOUNTER — Other Ambulatory Visit: Payer: Self-pay

## 2019-02-05 ENCOUNTER — Ambulatory Visit: Payer: Medicaid - Out of State | Attending: Family Medicine | Admitting: Physician Assistant

## 2019-02-05 VITALS — BP 160/119 | HR 97 | Temp 98.0°F | Ht 73.0 in | Wt 271.0 lb

## 2019-02-05 DIAGNOSIS — E1169 Type 2 diabetes mellitus with other specified complication: Secondary | ICD-10-CM | POA: Diagnosis not present

## 2019-02-05 DIAGNOSIS — I10 Essential (primary) hypertension: Secondary | ICD-10-CM | POA: Diagnosis not present

## 2019-02-05 DIAGNOSIS — Z794 Long term (current) use of insulin: Secondary | ICD-10-CM | POA: Diagnosis not present

## 2019-02-05 DIAGNOSIS — Z13220 Encounter for screening for lipoid disorders: Secondary | ICD-10-CM

## 2019-02-05 LAB — POCT GLYCOSYLATED HEMOGLOBIN (HGB A1C): HbA1c, POC (controlled diabetic range): 9 % — AB (ref 0.0–7.0)

## 2019-02-05 LAB — GLUCOSE, POCT (MANUAL RESULT ENTRY): POC Glucose: 185 mg/dl — AB (ref 70–99)

## 2019-02-05 MED ORDER — TRUEPLUS PEN NEEDLES 32G X 4 MM MISC: 11 refills | Status: DC

## 2019-02-05 MED ORDER — ATORVASTATIN CALCIUM 10 MG PO TABS: 10.0000 mg | ORAL_TABLET | Freq: Every day | ORAL | 3 refills | Status: DC

## 2019-02-05 MED ORDER — ACYCLOVIR 400 MG PO TABS: 400.0000 mg | ORAL_TABLET | Freq: Two times a day (BID) | ORAL | 1 refills | Status: DC

## 2019-02-05 MED ORDER — INSULIN NPH ISOPHANE & REGULAR (70-30) 100 UNIT/ML ~~LOC~~ SUSP: SUBCUTANEOUS | 4 refills | Status: DC

## 2019-02-05 MED ORDER — ONDANSETRON HCL 4 MG PO TABS: 4.0000 mg | ORAL_TABLET | Freq: Three times a day (TID) | ORAL | 0 refills | Status: DC | PRN

## 2019-02-05 MED ORDER — LISINOPRIL-HYDROCHLOROTHIAZIDE 20-25 MG PO TABS: 1.0000 | ORAL_TABLET | Freq: Every day | ORAL | 3 refills | Status: DC

## 2019-02-05 MED ORDER — "INSULIN SYRINGE-NEEDLE U-100 31G X 5/16"" 0.3 ML MISC": 3 refills | Status: DC

## 2019-02-05 MED FILL — ATORVASTATIN 10 MG TABLET: 10 | 30 days supply | Qty: 30 | Fill #0

## 2019-02-05 MED FILL — LISINOPRIL-HCTZ 20-25 MG TA: 20-25 | 30 days supply | Qty: 30 | Fill #0

## 2019-02-05 NOTE — Progress Notes (Signed)
Patient has been out of BP medication for 4 days.

## 2019-02-05 NOTE — Patient Instructions (Signed)
Check blood sugars 2-3 times daily and record and bring to next visit.  Make sure and take all your meds as directed

## 2019-02-05 NOTE — Progress Notes (Signed)
Patient ID: Jerry Arnold, male   DOB: [birth year], 33 y.o.   MRN: [id number]   Jerry Arnold, is a 33 y.o. male  XBL:[id number]  RAQ:[id number]  DOB - 1986/02/10  Subjective:  Chief Complaint and HPI: Jerry Arnold is a 33 y.o. male here today for med RF.  Out of BP meds for about 5 days.  Denies HA/CP/SOB.  Blood sugars running 120-190 at home.  Denies hypoglycemia.  No s/sx.  Takes his DM regimen about 85% of the time.  Admits to skipping evening dose at times.    No new issues today.  He is willing to get his flu shot.    ROS:   Constitutional:  No f/c, No night sweats, No unexplained weight loss. EENT:  No vision changes, No blurry vision, No hearing changes. No mouth, throat, or ear problems.  Respiratory: No cough, No SOB Cardiac: No CP, no palpitations GI:  No abd pain, No N/V/D. GU: No Urinary s/sx Musculoskeletal: No joint pain Neuro: No headache, no dizziness, no motor weakness.  Skin: No rash Endocrine:  No polydipsia. No polyuria.  Psych: Denies SI/HI  No problems updated.  ALLERGIES: No Known Allergies  PAST MEDICAL HISTORY: Past Medical History:  Diagnosis Date  . Hypertension   . Osteomyelitis ([place]) 04/11/2017   with "second toe on right foot" injury x 2-3 weeks (04/11/2017)  . Type II diabetes mellitus ([place])     MEDICATIONS AT HOME: Prior to Admission medications   Medication Sig Start Date End Date Taking? Authorizing Provider  insulin NPH-regular Human (70-30) 100 UNIT/ML injection 25 units with food in the morning and 23 units with dinner 02/05/19  Yes Eldor Conaway M, PA-C  Insulin Pen Needle (TRUEPLUS PEN NEEDLES) 32G X 4 MM MISC Use as directed to inject Victoza. 02/05/19  Yes Freeman Caldron M, PA-C  Insulin Syringe-Needle U-100 (TRUEPLUS INSULIN SYRINGE) 31G X 5/16" 0.3 ML MISC Use as directed to administer insulin twice daily 02/05/19  Yes Almee Pelphrey M, PA-C  isosorbide mononitrate (IMDUR) 60 MG 24 hr tablet Take 1 tablet (60 mg total)  by mouth daily. 05/20/18  Yes Charlott Rakes, MD  lisinopril-hydrochlorothiazide (ZESTORETIC) 20-25 MG tablet Take 1 tablet by mouth daily. 02/05/19  Yes Freeman Caldron M, PA-C  acetaminophen (TYLENOL) 500 MG tablet Take 1 tablet (500 mg total) by mouth every 6 (six) hours as needed. Patient not taking: Reported on 02/05/2019 09/07/18   Domenic Moras, PA-C  acyclovir (ZOVIRAX) 400 MG tablet Take 1 tablet (400 mg total) by mouth 2 (two) times daily. Prn outbreak 02/05/19   Argentina Donovan, PA-C  atorvastatin (LIPITOR) 10 MG tablet Take 1 tablet (10 mg total) by mouth daily. 02/05/19   Argentina Donovan, PA-C  ondansetron (ZOFRAN) 4 MG tablet Take 1 tablet (4 mg total) by mouth every 8 (eight) hours as needed for nausea or vomiting. 02/05/19   Argentina Donovan, PA-C  promethazine (PHENERGAN) 25 MG tablet Take 1 tablet (25 mg total) by mouth every 6 (six) hours as needed for nausea. Patient not taking: Reported on 02/05/2019 09/07/18   Domenic Moras, PA-C  metoCLOPramide (REGLAN) 5 MG tablet Take 1 tablet (5 mg total) by mouth every 6 (six) hours as needed for nausea. Patient not taking: Reported on 11/27/2017 04/24/17 09/07/18  Brayton Caves, PA-C     Objective:  EXAM:   Vitals:   02/05/19 1006  BP: (!) 160/119  Pulse: 97  Temp: 98 F (36.7 C)  TempSrc: Oral  SpO2: 97%  Weight: 271 lb (122.9 kg)  Height: 6\' 1"  (1.854 m)    General appearance : A&OX3. NAD. Non-toxic-appearing HEENT: Atraumatic and Normocephalic.  PERRLA. EOM intact.  Neck: supple, no JVD. No cervical lymphadenopathy. No thyromegaly Chest/Lungs:  Breathing-non-labored, Good air entry bilaterally, breath sounds normal without rales, rhonchi, or wheezing  CVS: S1 S2 regular, no murmurs, gallops, rubs  Neurology:  CN II-XII grossly intact, Non focal.   Psych:  TP linear. J/I WNL. Normal speech. Appropriate eye contact and affect.  Skin:  No Rash  Data Review Lab Results  Component Value Date   HGBA1C 9.0 (A) 02/05/2019    HGBA1C 12.0 (H) 05/22/2018   HGBA1C 9.4 (A) 11/27/2017     Assessment & Plan   1. Type 2 diabetes mellitus with other specified complication, with long-term current use of insulin (HCC) Uncontrolled-only 85% compliance with meds so I will only make a small adjustment.  Increase morning dose of insulin to 25.  Adhere to bid regimen and diabetic diet.  Check blood sugars tid and record and bring to next visit.   - POCT glycosylated hemoglobin (Hb A1C) - Glucose (CBG) - Insulin Syringe-Needle U-100 (TRUEPLUS INSULIN SYRINGE) 31G X 5/16" 0.3 ML MISC; Use as directed to administer insulin twice daily  Dispense: 100 each; Refill: 3 - Insulin Pen Needle (TRUEPLUS PEN NEEDLES) 32G X 4 MM MISC; Use as directed to inject Victoza.  Dispense: 100 each; Refill: 11 - insulin NPH-regular Human (70-30) 100 UNIT/ML injection; 25 units with food in the morning and 23 units with dinner  Dispense: 30 mL; Refill: 4 - Comprehensive metabolic panel - Lipid panel - CBC with Differential - atorvastatin (LIPITOR) 10 MG tablet; Take 1 tablet (10 mg total) by mouth daily.  Dispense: 90 tablet; Refill: 3  2. Essential hypertension Off meds for almost 1 week.  Check BP 3 times weekly and record and bring to next visit - lisinopril-hydrochlorothiazide (ZESTORETIC) 20-25 MG tablet; Take 1 tablet by mouth daily.  Dispense: 90 tablet; Refill: 3 - CBC with Differential  3.  Screening cholesterol and will start statin due to DM   Patient have been counseled extensively about nutrition and exercise  Return in about 3 weeks (around 02/26/2019) for 3 weeks for BP and blood sugar check;  3 months with PCP.  The patient was given clear instructions to go to ER or return to medical center if symptoms don't improve, worsen or new problems develop. The patient verbalized understanding. The patient was told to call to get lab results if they haven't heard anything in the next week.     04/26/2019, PA-C [facility] and [facility] [place], [place]   02/05/2019, 10:29 AM

## 2019-02-06 LAB — CBC WITH DIFFERENTIAL/PLATELET
Basophils Absolute: 0.1 10*3/uL (ref 0.0–0.2)
Basos: 1 %
EOS (ABSOLUTE): 0.1 10*3/uL (ref 0.0–0.4)
Eos: 2 %
Hematocrit: 41.2 % (ref 37.5–51.0)
Hemoglobin: 13.6 g/dL (ref 13.0–17.7)
Immature Grans (Abs): 0 10*3/uL (ref 0.0–0.1)
Immature Granulocytes: 0 %
Lymphocytes Absolute: 2.2 10*3/uL (ref 0.7–3.1)
Lymphs: 48 %
MCH: 28.5 pg (ref 26.6–33.0)
MCHC: 33 g/dL (ref 31.5–35.7)
MCV: 86 fL (ref 79–97)
Monocytes Absolute: 0.5 10*3/uL (ref 0.1–0.9)
Monocytes: 12 %
Neutrophils Absolute: 1.7 10*3/uL (ref 1.4–7.0)
Neutrophils: 37 %
Platelets: 242 10*3/uL (ref 150–450)
RBC: 4.78 x10E6/uL (ref 4.14–5.80)
RDW: 14.2 % (ref 11.6–15.4)
WBC: 4.7 10*3/uL (ref 3.4–10.8)

## 2019-02-06 LAB — LIPID PANEL
Chol/HDL Ratio: 5.4 ratio — ABNORMAL HIGH (ref 0.0–5.0)
Cholesterol, Total: 216 mg/dL — ABNORMAL HIGH (ref 100–199)
HDL: 40 mg/dL (ref >39)
LDL Chol Calc (NIH): 147 mg/dL — ABNORMAL HIGH (ref 0–99)
Triglycerides: 161 mg/dL — ABNORMAL HIGH (ref 0–149)
VLDL Cholesterol Cal: 29 mg/dL (ref 5–40)

## 2019-02-06 LAB — COMPREHENSIVE METABOLIC PANEL
ALT: 15 IU/L (ref 0–44)
AST: 15 IU/L (ref 0–40)
Albumin/Globulin Ratio: 1.3 (ref 1.2–2.2)
Albumin: 4.3 g/dL (ref 4.0–5.0)
Alkaline Phosphatase: 111 IU/L (ref 39–117)
BUN/Creatinine Ratio: 13 (ref 9–20)
BUN: 16 mg/dL (ref 6–20)
Bilirubin Total: 0.3 mg/dL (ref 0.0–1.2)
CO2: 26 mmol/L (ref 20–29)
Calcium: 9.1 mg/dL (ref 8.7–10.2)
Chloride: 103 mmol/L (ref 96–106)
Creatinine, Ser: 1.27 mg/dL (ref 0.76–1.27)
GFR calc Af Amer: 85 mL/min/{1.73_m2} (ref >59)
GFR calc non Af Amer: 73 mL/min/{1.73_m2} (ref >59)
Globulin, Total: 3.3 g/dL (ref 1.5–4.5)
Glucose: 175 mg/dL — ABNORMAL HIGH (ref 65–99)
Potassium: 4.6 mmol/L (ref 3.5–5.2)
Sodium: 139 mmol/L (ref 134–144)
Total Protein: 7.6 g/dL (ref 6.0–8.5)

## 2019-02-26 ENCOUNTER — Other Ambulatory Visit: Payer: Self-pay

## 2019-02-26 ENCOUNTER — Ambulatory Visit: Payer: Medicaid - Out of State | Attending: Family Medicine | Admitting: Pharmacist

## 2019-02-26 DIAGNOSIS — I10 Essential (primary) hypertension: Secondary | ICD-10-CM | POA: Diagnosis not present

## 2019-02-26 MED ORDER — ISOSORBIDE MONONITRATE ER 60 MG PO TB24: 60.0000 mg | ORAL_TABLET | Freq: Every day | ORAL | 2 refills | Status: DC

## 2019-02-26 MED FILL — ISOSORBIDE MN ER 60 MG TAB: 60 | 30 days supply | Qty: 30 | Fill #0

## 2019-02-26 NOTE — Progress Notes (Signed)
   S:    Patient arrives in good spirits. Presents to the clinic for hypertension evaluation, counseling, and management.  Patient was referred on 02/05/19 by Marylene Land. BP was 160/119 at that visit secondary to patient being out of BP medications prior.   Patient reports adherence with medications.  Current BP Medications include:  Lisinopril-HCTZ 20-25 mg daily; not taking Imdur 60 mg daily   Antihypertensives tried in the past include: amlodipine (caused constipation); lisinopril  Dietary habits include: does not limit salt; denies drinking excess caffeine  Exercise habits include: denies  Family / Social history:  - FHx: DM, HTN - Tobacco: never smoker - Alcohol: denies use   O:  Vitals:   02/27/19 1430  BP: 135/83   Home BP readings: reports 130s/100s  Last 3 Office BP readings: BP Readings from Last 3 Encounters:  02/27/19 135/83  02/05/19 (!) 160/119  09/15/18 136/89   BMET    Component Value Date/Time   NA 139 02/05/2019 1029   K 4.6 02/05/2019 1029   CL 103 02/05/2019 1029   CO2 26 02/05/2019 1029   GLUCOSE 175 (H) 02/05/2019 1029   GLUCOSE 314 (H) 09/14/2018 0414   BUN 16 02/05/2019 1029   CREATININE 1.27 02/05/2019 1029   CALCIUM 9.1 02/05/2019 1029   GFRNONAA 73 02/05/2019 1029   GFRAA 85 02/05/2019 1029   Renal function: CrCl cannot be calculated (Patient's most recent lab result is older than the maximum 21 days allowed.).  Clinical ASCVD: No  The ASCVD Risk score Denman George DC Jr., et al., 2013) failed to calculate for the following reasons:   The 2013 ASCVD risk score is only valid for ages 74 to 34   A/P: Hypertension longstanding currently close to goal on current medications. BP Goal = <130/80 mmHg. Patient is adherent with medications. Will give refills on Imdur. Encouraged continued compliance.   -Continued current regimen.  -Counseled on lifestyle modifications for blood pressure control including reduced dietary sodium, increased exercise,  adequate sleep  Results reviewed and written information provided.   Total time in face-to-face counseling 15 minutes.   F/U Clinic Visit in 1 month.  Butch Penny, PharmD, CPP Clinical Pharmacist [facility] & [facility] [phone]

## 2019-03-02 MED FILL — LISINOPRIL-HCTZ 20-25 MG TA: 20-25 | 30 days supply | Qty: 30 | Fill #1

## 2019-03-30 MED FILL — ISOSORBIDE MN ER 60 MG TAB: 60 | 30 days supply | Qty: 30 | Fill #1

## 2019-03-30 MED FILL — $novoLIN 70/30 100 UNITS/ML: (70-30) 100 | 42 days supply | Qty: 20 | Fill #2

## 2019-03-30 MED FILL — LISINOPRIL-HCTZ 20-25 MG TA: 20-25 | 30 days supply | Qty: 30 | Fill #2

## 2019-04-01 ENCOUNTER — Ambulatory Visit: Payer: Self-pay | Attending: Family Medicine | Admitting: Pharmacist

## 2019-04-01 ENCOUNTER — Other Ambulatory Visit: Payer: Self-pay

## 2019-04-01 VITALS — BP 146/96 | HR 97

## 2019-04-01 DIAGNOSIS — I10 Essential (primary) hypertension: Secondary | ICD-10-CM

## 2019-04-01 NOTE — Progress Notes (Signed)
   S:    Patient arrives in good spirits. Presents to the clinic for hypertension evaluation, counseling, and management.  Patient was referred on 02/05/19 by Marylene Land. I saw him last month and refilled his Imdur.    Patient reports adherence with medications.  Patient denies chest pain, dyspnea, HA or blurred vision. Denies BLE edema.   Current BP Medications include:  Lisinopril-HCTZ 20-25 mg daily; not taking Imdur 60 mg daily    Antihypertensives tried in the past include: amlodipine (caused constipation); lisinopril  Dietary habits include: pt reports cutting back salt but admits to eating a bacon egg and cheese biscuit; denies drinking excess caffeine  Exercise habits include: denies  Family / Social history:  - FHx: DM, HTN - Tobacco: never smoker - Alcohol: denies use   O:  Vitals:   04/01/19 1053  BP: (!) 146/96  Pulse: 97    Home BP readings: took once since last visit and reports that it was good   Last 3 Office BP readings: BP Readings from Last 3 Encounters:  04/01/19 (!) 146/96  02/27/19 135/83  02/05/19 (!) 160/119   BMET    Component Value Date/Time   NA 139 02/05/2019 1029   K 4.6 02/05/2019 1029   CL 103 02/05/2019 1029   CO2 26 02/05/2019 1029   GLUCOSE 175 (H) 02/05/2019 1029   GLUCOSE 314 (H) 09/14/2018 0414   BUN 16 02/05/2019 1029   CREATININE 1.27 02/05/2019 1029   CALCIUM 9.1 02/05/2019 1029   GFRNONAA 73 02/05/2019 1029   GFRAA 85 02/05/2019 1029   Renal function: CrCl cannot be calculated (Patient's most recent lab result is older than the maximum 21 days allowed.).  Clinical ASCVD: No  The ASCVD Risk score Denman George DC Jr., et al., 2013) failed to calculate for the following reasons:   The 2013 ASCVD risk score is only valid for ages 36 to 44   A/P: Hypertension longstanding currently above goall on current medications. BP Goal = <130/80 mmHg. Patient is adherent with medications. Encouraged continued compliance.  Bp could be elevated  d/t consuming excess sodium this morning.  -Continued current regimen.  -Counseled on lifestyle modifications for blood pressure control including reduced dietary sodium, increased exercise, adequate sleep  Results reviewed and written information provided.   Total time in face-to-face counseling 15 minutes.   F/U Clinic Visit in 1 month.  Butch Penny, PharmD, CPP Clinical Pharmacist [facility] & [facility] [phone]

## 2019-04-23 MED FILL — ISOSORBIDE MN ER 60 MG TAB: 60 | 30 days supply | Qty: 30 | Fill #2

## 2019-04-23 MED FILL — LISINOPRIL-HCTZ 20-25 MG TA: 20-25 | 30 days supply | Qty: 30 | Fill #3

## 2019-05-13 ENCOUNTER — Other Ambulatory Visit: Payer: Self-pay

## 2019-05-13 ENCOUNTER — Encounter: Payer: Self-pay | Admitting: Family Medicine

## 2019-05-13 ENCOUNTER — Ambulatory Visit: Payer: Medicaid - Out of State | Attending: Family Medicine | Admitting: Family Medicine

## 2019-05-13 VITALS — BP 142/87 | HR 93 | Ht 73.0 in | Wt 281.0 lb

## 2019-05-13 DIAGNOSIS — Z6837 Body mass index (BMI) 37.0-37.9, adult: Secondary | ICD-10-CM

## 2019-05-13 DIAGNOSIS — E785 Hyperlipidemia, unspecified: Secondary | ICD-10-CM | POA: Diagnosis not present

## 2019-05-13 DIAGNOSIS — Z794 Long term (current) use of insulin: Secondary | ICD-10-CM

## 2019-05-13 DIAGNOSIS — I10 Essential (primary) hypertension: Secondary | ICD-10-CM | POA: Diagnosis not present

## 2019-05-13 DIAGNOSIS — Z89421 Acquired absence of other right toe(s): Secondary | ICD-10-CM

## 2019-05-13 DIAGNOSIS — E1169 Type 2 diabetes mellitus with other specified complication: Secondary | ICD-10-CM | POA: Diagnosis not present

## 2019-05-13 LAB — POCT GLYCOSYLATED HEMOGLOBIN (HGB A1C): HbA1c, POC (controlled diabetic range): 9.4 % — AB (ref 0.0–7.0)

## 2019-05-13 LAB — GLUCOSE, POCT (MANUAL RESULT ENTRY): POC Glucose: 188 mg/dl — AB (ref 70–99)

## 2019-05-13 MED ORDER — LISINOPRIL-HYDROCHLOROTHIAZIDE 20-25 MG PO TABS: 1.0000 | ORAL_TABLET | Freq: Every day | ORAL | 1 refills | Status: DC

## 2019-05-13 MED ORDER — INSULIN NPH ISOPHANE & REGULAR (70-30) 100 UNIT/ML ~~LOC~~ SUSP: SUBCUTANEOUS | 4 refills | Status: DC

## 2019-05-13 MED ORDER — ISOSORBIDE MONONITRATE ER 60 MG PO TB24: 60.0000 mg | ORAL_TABLET | Freq: Every day | ORAL | 1 refills | Status: DC

## 2019-05-13 MED FILL — !NOVOLIN 70/30 100 UNITS/ML: (70-30) 100 | 35 days supply | Qty: 20 | Fill #0

## 2019-05-13 NOTE — Progress Notes (Signed)
Subjective:  Patient ID: Jerry Arnold, male    DOB: 06/01/1985  Age: 34 y.o. MRN: [id number]  CC: Diabetes   HPI Jerry Arnold has a past medical history of Type 2 DM (A1c 9.4), HTN, hyperlipidemia, status post right second toe amputation in 03/2017 seen for follow-up visit.  He had headaches and so discontinued Jerry Arnold  Blood sugars are 150-low 200s (random); fasting sugars have been in the low 100s; highest was 260 and he has been on 26 units of NPH bid Eye visit - 02/2019 in [facility] which time he was diagnosed with diabetic retinopathy.  He underwent laser eye sx in 10/2018 Denies presence of numbness in extremities. He is compliant with his antihypertensive and denies adverse effects from it.  Past Medical History:  Diagnosis Date  . Hypertension   . Osteomyelitis (HCC) 04/11/2017   with "second toe on right foot" injury x 2-3 weeks (04/11/2017)  . Type II diabetes mellitus (HCC)     Past Surgical History:  Procedure Laterality Date  . AMPUTATION Right 04/13/2017   Procedure: SECOND FOOT RAY AMPUTATION;  Surgeon: Jerry Mustard, MD;  [facility];  Service: Orthopedics;  Laterality: Right;  . NO PAST SURGERIES      Family History  Problem Relation Age of Onset  . Hypertension Mother   . Diabetes Mother   . Hypertension Maternal Grandmother   . Hypertension Paternal Grandmother     No Known Allergies  Outpatient Medications Prior to Visit  Medication Sig Dispense Refill  . insulin NPH-regular Human (70-30) 100 UNIT/ML injection 25 units with food in the morning and 23 units with dinner 30 mL 4  . Insulin Pen Needle (TRUEPLUS PEN NEEDLES) 32G X 4 MM MISC Use as directed to inject Victoza. 100 each 11  . Insulin Syringe-Needle U-100 (TRUEPLUS INSULIN SYRINGE) 31G X 5/16" 0.3 ML MISC Use as directed to administer insulin twice daily 100 each 3  . isosorbide mononitrate (IMDUR) 60 MG 24 hr tablet Take 1 tablet (60 mg total) by mouth daily. 30 tablet 2  .  lisinopril-hydrochlorothiazide (ZESTORETIC) 20-25 MG tablet Take 1 tablet by mouth daily. 90 tablet 3  . ondansetron (ZOFRAN) 4 MG tablet Take 1 tablet (4 mg total) by mouth every 8 (eight) hours as needed for nausea or vomiting. 20 tablet 0  . acetaminophen (TYLENOL) 500 MG tablet Take 1 tablet (500 mg total) by mouth every 6 (six) hours as needed. (Patient not taking: Reported on 02/05/2019) 30 tablet 0  . acyclovir (ZOVIRAX) 400 MG tablet Take 1 tablet (400 mg total) by mouth 2 (two) times daily. Prn outbreak 50 tablet 1  . atorvastatin (Jerry Arnold) 10 MG tablet Take 1 tablet (10 mg total) by mouth daily. (Patient not taking: Reported on 05/13/2019) 90 tablet 3  . promethazine (PHENERGAN) 25 MG tablet Take 1 tablet (25 mg total) by mouth every 6 (six) hours as needed for nausea. (Patient not taking: Reported on 02/05/2019) 20 tablet 0   No facility-administered medications prior to visit.     ROS Review of Systems  Constitutional: Negative for activity change and appetite change.  HENT: Negative for sinus pressure and sore throat.   Eyes: Negative for visual disturbance.  Respiratory: Negative for cough, chest tightness and shortness of breath.   Cardiovascular: Negative for chest pain and leg swelling.  Gastrointestinal: Negative for abdominal distention, abdominal pain, constipation and diarrhea.  Endocrine: Negative.   Genitourinary: Negative for dysuria.  Musculoskeletal: Negative for joint swelling and myalgias.  Skin: Negative for rash.  Allergic/Immunologic: Negative.   Neurological: Negative for weakness, light-headedness and numbness.  Psychiatric/Behavioral: Negative for dysphoric mood and suicidal ideas.    Objective:  BP (!) 142/87   Pulse 93   Ht 6\' 1"  (1.854 m)   Wt 281 lb (127.5 kg)   SpO2 98%   BMI 37.07 kg/m   BP/Weight 05/13/2019 06/27/3265 02/20/4578  Systolic BP 998 338 250  Diastolic BP 87 96 83  Wt. (Lbs) 281 - -  BMI 37.07 - -      Physical  Exam Constitutional:      Appearance: He is well-developed.  Neck:     Vascular: No JVD.  Cardiovascular:     Rate and Rhythm: Normal rate.     Heart sounds: Normal heart sounds. No murmur.  Pulmonary:     Effort: Pulmonary effort is normal.     Breath sounds: Normal breath sounds. No wheezing or rales.  Chest:     Chest wall: No tenderness.  Abdominal:     General: Bowel sounds are normal. There is no distension.     Palpations: Abdomen is soft. There is no mass.     Tenderness: There is no abdominal tenderness.  Musculoskeletal:        General: Normal range of motion.     Right lower leg: No edema.     Left lower leg: No edema.     Comments: Right second toe amputation  Neurological:     Mental Status: He is alert and oriented to person, place, and time.  Psychiatric:        Mood and Affect: Mood normal.     CMP Latest Ref Rng & Units 02/05/2019 09/14/2018 09/13/2018  Glucose 65 - 99 mg/dL 175(H) 314(H) 317(H)  BUN 6 - 20 mg/dL 16 27(H) 24(H)  Creatinine 0.76 - 1.27 mg/dL 1.27 1.34(H) 1.71(H)  Sodium 134 - 144 mmol/L 139 137 138  Potassium 3.5 - 5.2 mmol/L 4.6 4.8 5.1  Chloride 96 - 106 mmol/L 103 106 104  CO2 20 - 29 mmol/L 26 20(L) 23  Calcium 8.7 - 10.2 mg/dL 9.1 8.9 8.5(L)  Total Protein 6.0 - 8.5 g/dL 7.6 7.9 8.2(H)  Total Bilirubin 0.0 - 1.2 mg/dL 0.3 0.4 0.4  Alkaline Phos 39 - 117 IU/L 111 71 79  AST 0 - 40 IU/L 15 28 38  ALT 0 - 44 IU/L 15 28 29     Lipid Panel     Component Value Date/Time   CHOL 216 (H) 02/05/2019 1029   TRIG 161 (H) 02/05/2019 1029   HDL 40 02/05/2019 1029   CHOLHDL 5.4 (H) 02/05/2019 1029   CHOLHDL 3.8 04/12/2017 0036   VLDL 18 04/12/2017 0036   LDLCALC 147 (H) 02/05/2019 1029    CBC    Component Value Date/Time   WBC 4.7 02/05/2019 1029   WBC 4.8 09/14/2018 0414   RBC 4.78 02/05/2019 1029   RBC 4.17 (L) 09/14/2018 0414   HGB 13.6 02/05/2019 1029   HCT 41.2 02/05/2019 1029   PLT 242 02/05/2019 1029   MCV 86 02/05/2019  1029   MCH 28.5 02/05/2019 1029   MCH 27.8 09/14/2018 0414   MCHC 33.0 02/05/2019 1029   MCHC 32.0 09/14/2018 0414   RDW 14.2 02/05/2019 1029   LYMPHSABS 2.2 02/05/2019 1029   MONOABS 0.4 09/11/2018 2122   EOSABS 0.1 02/05/2019 1029   BASOSABS 0.1 02/05/2019 1029    Lab Results  Component Value Date   HGBA1C 9.4 (  A) 05/13/2019    Assessment & Plan:  1. Type 2 diabetes mellitus with other specified complication, with long-term current use of insulin (HCC) Uncontrolled with A1c of 9.4 Advised to increase morning dose of NPH given his random sugars are still elevated; I am holding off on increasing evening dose to prevent early morning hypoglycemia Weight loss will be beneficial - POCT glucose (manual entry) - POCT glycosylated hemoglobin (Hb A1C) - insulin NPH-regular Human (70-30) 100 UNIT/ML injection; 30 units with food in the morning and 26 units with dinner  Dispense: 30 mL; Refill: 4  2. Essential hypertension Slightly above goal No regimen change today Advised to work on weight loss - Basic Metabolic Panel - isosorbide mononitrate (IMDUR) 60 MG 24 hr tablet; Take 1 tablet (60 mg total) by mouth daily.  Dispense: 90 tablet; Refill: 1 - lisinopril-hydrochlorothiazide (ZESTORETIC) 20-25 MG tablet; Take 1 tablet by mouth daily.  Dispense: 90 tablet; Refill: 1  3. Class 2 severe obesity due to excess calories with serious comorbidity and body mass index (BMI) of 37.0 to 37.9 in adult [facility]) Counseled on reducing portion sizes, increasing physical activity, avoid late meals  4. Hyperlipidemia associated with type 2 diabetes mellitus (HCC) Unable to tolerate atorvastatin He will need to work on low cholesterol diet and lifestyle modification   Hoy Register, MD, FAAFP. [facility] and [facility] [place], [place] [phone]   05/13/2019, 11:44 AM

## 2019-05-13 NOTE — Patient Instructions (Signed)

## 2019-05-13 NOTE — Progress Notes (Signed)
Patient stopped taking Lipitor due to headaches.

## 2019-05-14 LAB — BASIC METABOLIC PANEL
BUN/Creatinine Ratio: 17 (ref 9–20)
BUN: 26 mg/dL — ABNORMAL HIGH (ref 6–20)
CO2: 23 mmol/L (ref 20–29)
Calcium: 10 mg/dL (ref 8.7–10.2)
Chloride: 101 mmol/L (ref 96–106)
Creatinine, Ser: 1.51 mg/dL — ABNORMAL HIGH (ref 0.76–1.27)
GFR calc Af Amer: 69 mL/min/{1.73_m2} (ref >59)
GFR calc non Af Amer: 59 mL/min/{1.73_m2} — ABNORMAL LOW (ref >59)
Glucose: 134 mg/dL — ABNORMAL HIGH (ref 65–99)
Potassium: 4.3 mmol/L (ref 3.5–5.2)
Sodium: 139 mmol/L (ref 134–144)

## 2019-05-14 IMAGING — US US RENAL
1 series · 14 of 25 positions shown · non-contrast
Comparison: None in PACs

CLINICAL DATA: Acute renal injury.  Osteomyelitis, diabetes.

EXAM:
RENAL / URINARY TRACT ULTRASOUND COMPLETE

[Series 1: us renal · 0.26mm/px · 14 of 30 slices shown]
[im 1/30]
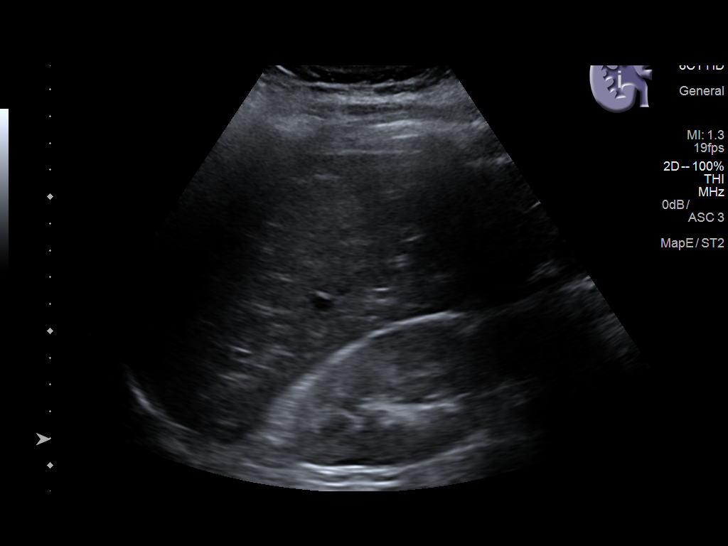
[im 3/30]
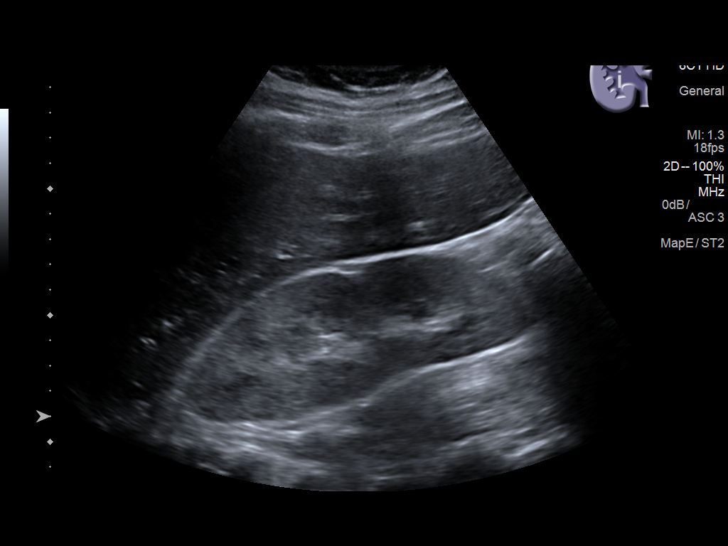
[im 5/30]
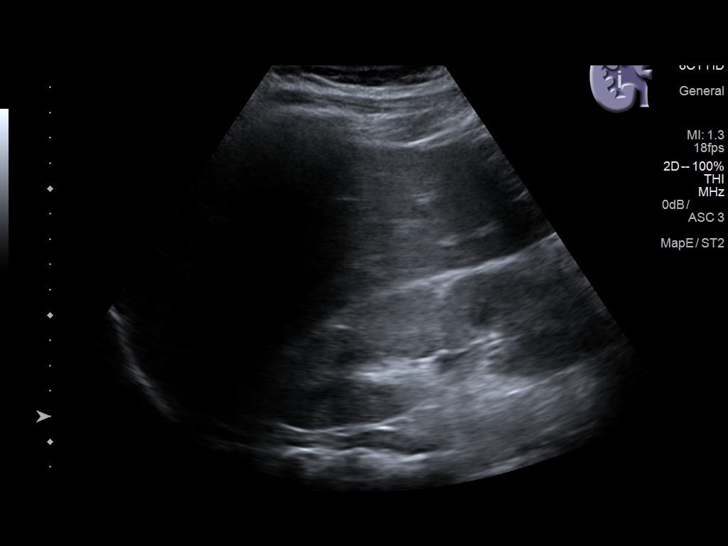
[im 8/30]
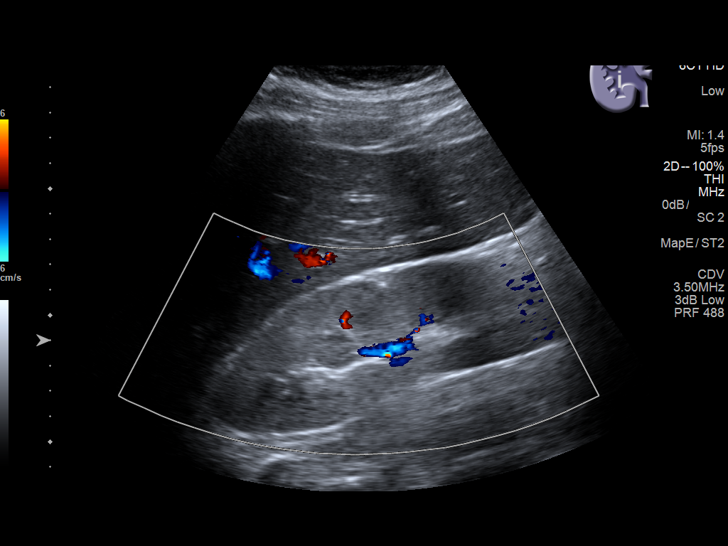
[im 10/30]
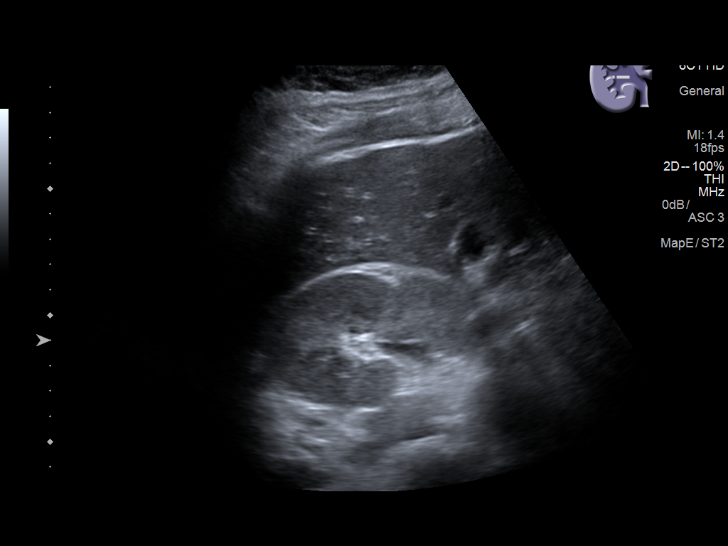
[im 11/30]
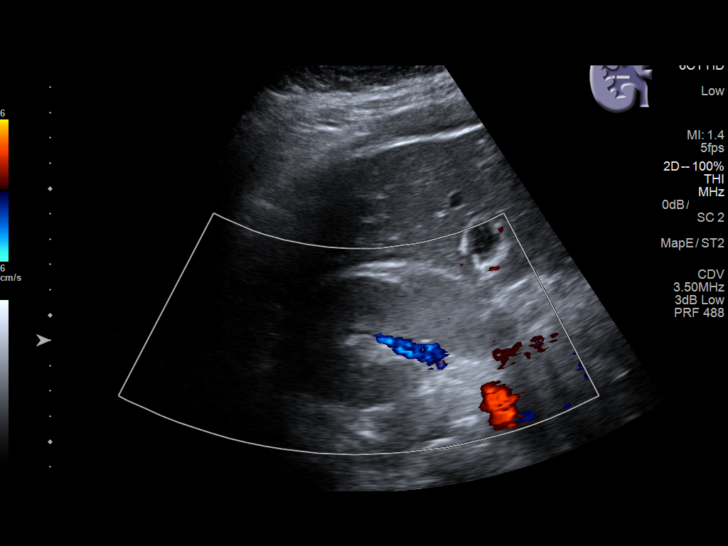
[im 14/30]
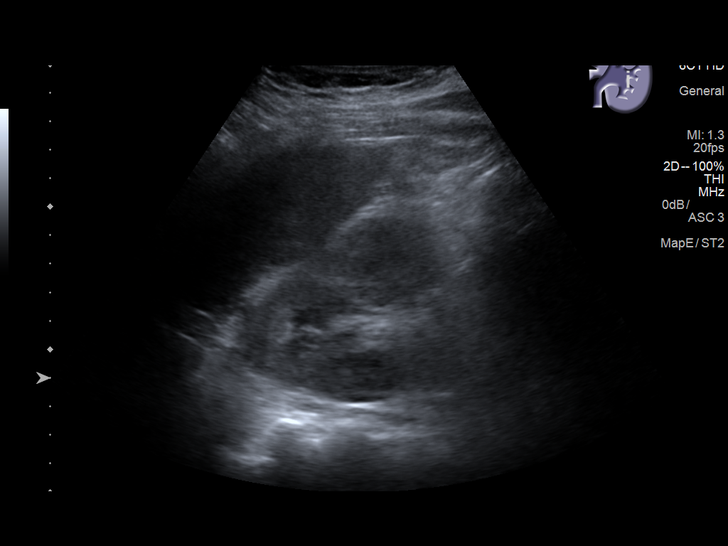
[im 16/30]
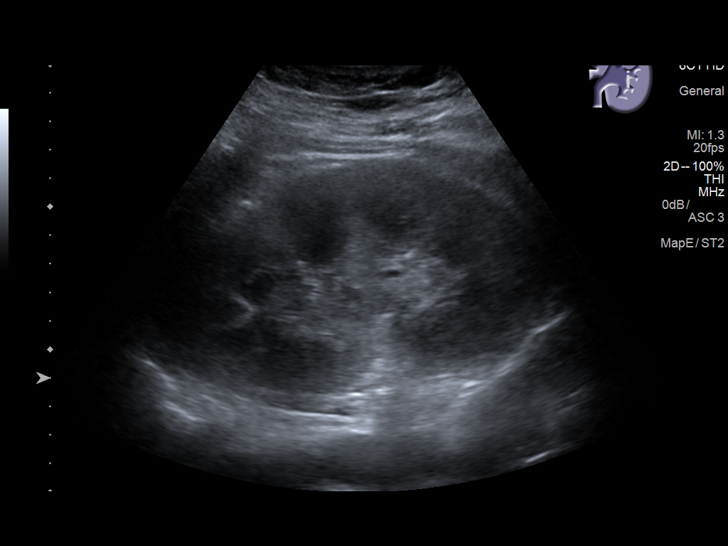
[im 19/30]
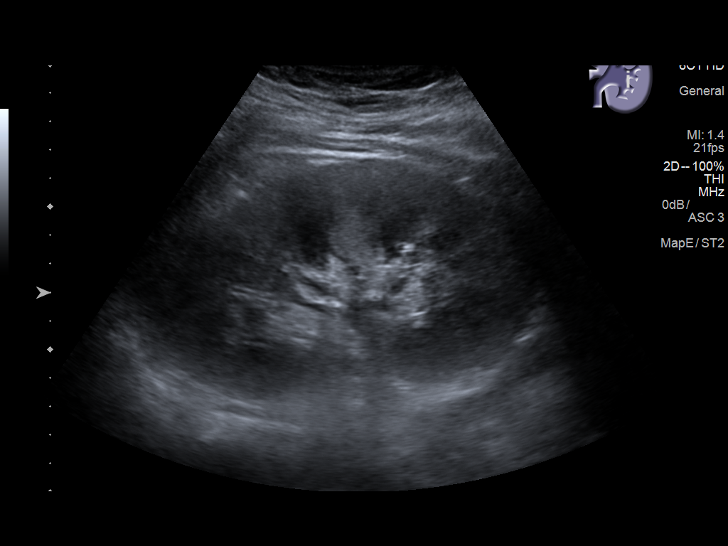
[im 20/30]
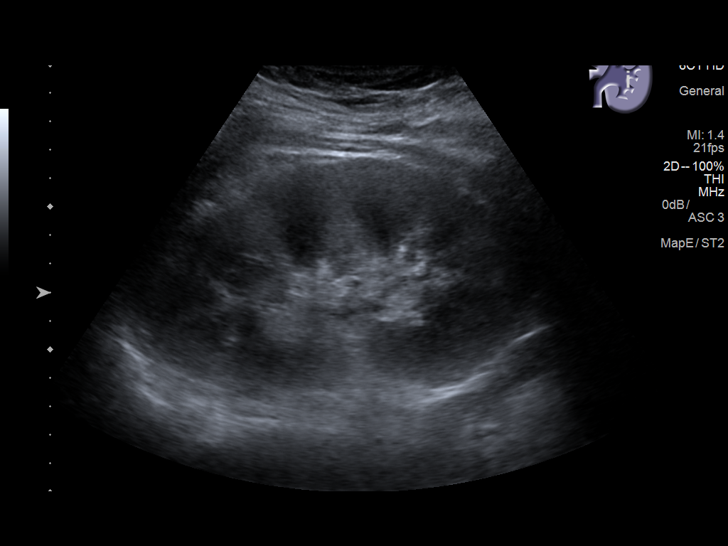
[im 22/30]
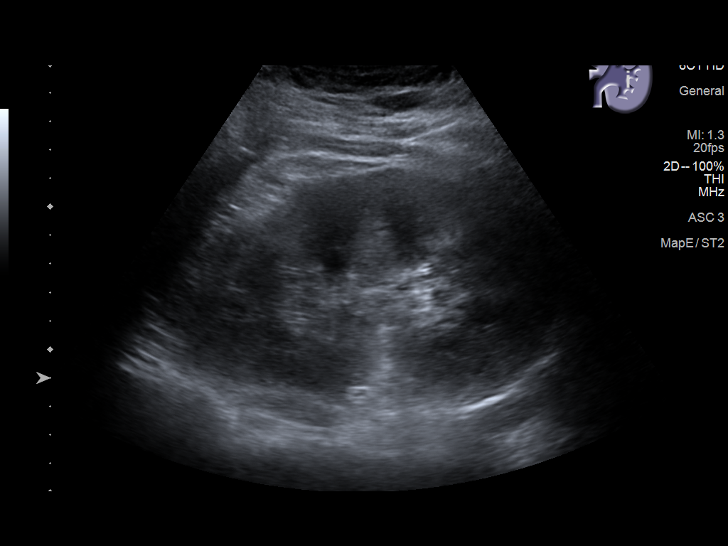
[im 25/30]
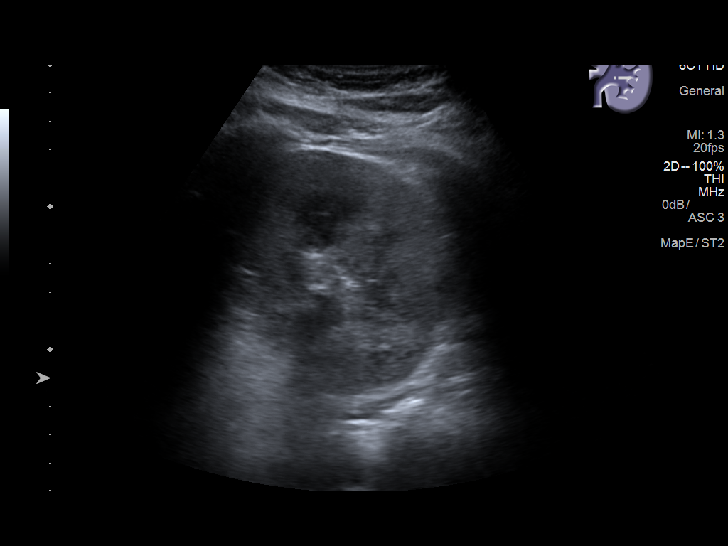
[im 27/30]
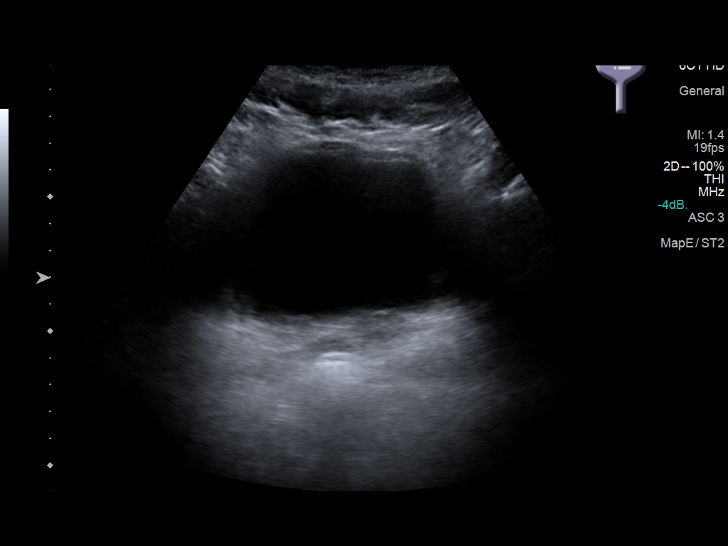
[im 30/30]
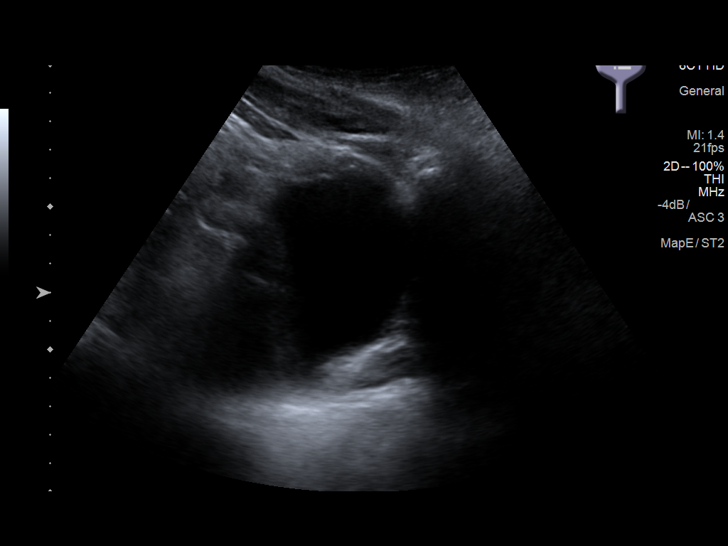

[14 of 25 positions shown; findings below may reference images not displayed]

FINDINGS: Right Kidney:

Length: 14.1 cm. The renal cortical echotexture exceeds that of the
adjacent liver. There is no hydronephrosis or cystic or solid mass.

Left Kidney:

Length: 14.9 cm. The renal cortical echotexture is increased similar
to that on the right. There is no hydronephrosis or cystic or solid
mass.

Bladder:

Appears normal for degree of bladder distention.
IMPRESSION: Increased renal cortical echotexture compatible with medical renal
disease. No hydronephrosis.

## 2019-05-18 MED FILL — LISINOPRIL-HCTZ 20-25 MG TA: 20-25 | 30 days supply | Qty: 30 | Fill #4

## 2019-05-18 MED FILL — ISOSORBIDE MN ER 60 MG TAB: 60 | 30 days supply | Qty: 30 | Fill #0

## 2019-06-22 MED FILL — !NOVOLIN 70/30 100 UNITS/ML: (70-30) 100 | 20 days supply | Qty: 10 | Fill #0

## 2019-07-13 MED FILL — LISINOPRIL-HYDROCHLOROTHIAZ: 20-25 | 30 days supply | Qty: 30 | Fill #5

## 2019-07-13 MED FILL — !NOVOLIN 70/30 100 UNITS/ML: (70-30) 100 | 20 days supply | Qty: 10 | Fill #1

## 2019-07-13 MED FILL — ISOSORBIDE MN ER 60 MG TAB: 60 | 30 days supply | Qty: 30 | Fill #1

## 2019-08-07 MED FILL — ISOSORBIDE MN ER 60 MG TAB: 60 | 30 days supply | Qty: 30 | Fill #2

## 2019-08-07 MED FILL — !NOVOLIN 70/30 100 UNITS/ML: (70-30) 100 | 20 days supply | Qty: 10 | Fill #2

## 2019-08-07 MED FILL — LISINOPRIL-HYDROCHLOROTHIAZ: 20-25 | 30 days supply | Qty: 30 | Fill #6

## 2019-08-12 ENCOUNTER — Ambulatory Visit: Payer: Self-pay | Attending: Family Medicine | Admitting: Family Medicine

## 2019-08-12 ENCOUNTER — Encounter: Payer: Self-pay | Admitting: Family Medicine

## 2019-08-12 ENCOUNTER — Other Ambulatory Visit: Payer: Self-pay | Admitting: Family Medicine

## 2019-08-12 ENCOUNTER — Other Ambulatory Visit: Payer: Self-pay

## 2019-08-12 VITALS — BP 148/92 | HR 90 | Ht 73.0 in | Wt 276.6 lb

## 2019-08-12 DIAGNOSIS — Z1159 Encounter for screening for other viral diseases: Secondary | ICD-10-CM

## 2019-08-12 DIAGNOSIS — E1169 Type 2 diabetes mellitus with other specified complication: Secondary | ICD-10-CM

## 2019-08-12 DIAGNOSIS — E785 Hyperlipidemia, unspecified: Secondary | ICD-10-CM

## 2019-08-12 DIAGNOSIS — L97509 Non-pressure chronic ulcer of other part of unspecified foot with unspecified severity: Secondary | ICD-10-CM

## 2019-08-12 DIAGNOSIS — I10 Essential (primary) hypertension: Secondary | ICD-10-CM

## 2019-08-12 DIAGNOSIS — E11621 Type 2 diabetes mellitus with foot ulcer: Secondary | ICD-10-CM

## 2019-08-12 DIAGNOSIS — Z794 Long term (current) use of insulin: Secondary | ICD-10-CM

## 2019-08-12 LAB — POCT GLYCOSYLATED HEMOGLOBIN (HGB A1C): HbA1c, POC (controlled diabetic range): 10.1 % — AB (ref 0.0–7.0)

## 2019-08-12 LAB — GLUCOSE, POCT (MANUAL RESULT ENTRY): POC Glucose: 115 mg/dl — AB (ref 70–99)

## 2019-08-12 MED ORDER — LISINOPRIL-HYDROCHLOROTHIAZIDE 20-25 MG PO TABS: 1.0000 | ORAL_TABLET | Freq: Every day | ORAL | 1 refills | Status: DC

## 2019-08-12 MED ORDER — TRULICITY 0.75 MG/0.5ML ~~LOC~~ SOAJ: 0.7500 mg | SUBCUTANEOUS | 3 refills | Status: DC

## 2019-08-12 MED ORDER — INSULIN NPH ISOPHANE & REGULAR (70-30) 100 UNIT/ML ~~LOC~~ SUSP: SUBCUTANEOUS | 4 refills | Status: DC

## 2019-08-12 MED ORDER — AMLODIPINE BESYLATE 10 MG PO TABS: 10.0000 mg | ORAL_TABLET | Freq: Every day | ORAL | 6 refills | Status: DC

## 2019-08-12 MED ORDER — TRUEPLUS PEN NEEDLES 32G X 4 MM MISC: 11 refills | Status: DC

## 2019-08-12 MED FILL — AMLODIPINE BESYLATE 10 MG T: 10 | 30 days supply | Qty: 30 | Fill #0

## 2019-08-12 MED FILL — !TRULICITY 0.75 MG/0.5 ML P: 0.75 | 28 days supply | Qty: 2 | Fill #0

## 2019-08-12 NOTE — Patient Instructions (Signed)

## 2019-08-12 NOTE — Progress Notes (Signed)
States that isosorbide makes his neck hurt.

## 2019-08-12 NOTE — Progress Notes (Signed)
Subjective:  Patient ID: Jerry Arnold, male    DOB: [birth year]  Age: 34 y.o. MRN: [id number]  CC: Diabetes   HPI Jerry Arnold has a past medical history of Type 2 DM (A1c 9.4), HTN, hyperlipidemia, status post right second toe amputation in 03/2017 seen for follow-up visit He complains of neck pain with isosorbide nitrate which he found out through a process of elimination.  Bedtime sugars are 160 and fasting sugars 125. He denies hypoglycemia and endorses compliance with Lantus only that he has been administering 28 units in the morning and 20 3 in the evening rather than 30 in the morning and 26 in the evening. He has no visual concerns and has no complaints of numbness in his extremities. For his hyperlipidemia he has been unable to tolerate atorvastatin due to previous complaints of headache. He does have an ulcer on his right third toe which he sustained when furniture fell on his right foot during moving.  He applied peroxide and denies any discharge or drainage from it.  Past Medical History:  Diagnosis Date  . Hypertension   . Osteomyelitis ([place]) 04/11/2017   with "second toe on right foot" injury x 2-3 weeks (04/11/2017)  . Type II diabetes mellitus ([place])     Past Surgical History:  Procedure Laterality Date  . AMPUTATION Right 04/13/2017   Procedure: SECOND FOOT RAY AMPUTATION;  Surgeon: Newt Minion, MD;  Location: [place];  Service: Orthopedics;  Laterality: Right;  . NO PAST SURGERIES      Family History  Problem Relation Age of Onset  . Hypertension Mother   . Diabetes Mother   . Hypertension Maternal Grandmother   . Hypertension Paternal Grandmother     No Known Allergies  Outpatient Medications Prior to Visit  Medication Sig Dispense Refill  . insulin NPH-regular Human (70-30) 100 UNIT/ML injection 30 units with food in the morning and 26 units with dinner 30 mL 4  . Insulin Pen Needle (TRUEPLUS PEN NEEDLES) 32G X 4 MM MISC Use as directed to inject  Victoza. 100 each 11  . Insulin Syringe-Needle U-100 (TRUEPLUS INSULIN SYRINGE) 31G X 5/16" 0.3 ML MISC Use as directed to administer insulin twice daily 100 each 3  . isosorbide mononitrate (IMDUR) 60 MG 24 hr tablet Take 1 tablet (60 mg total) by mouth daily. 90 tablet 1  . lisinopril-hydrochlorothiazide (ZESTORETIC) 20-25 MG tablet Take 1 tablet by mouth daily. 90 tablet 1  . acetaminophen (TYLENOL) 500 MG tablet Take 1 tablet (500 mg total) by mouth every 6 (six) hours as needed. (Patient not taking: Reported on 02/05/2019) 30 tablet 0  . acyclovir (ZOVIRAX) 400 MG tablet Take 1 tablet (400 mg total) by mouth 2 (two) times daily. Prn outbreak (Patient not taking: Reported on 08/12/2019) 50 tablet 1  . atorvastatin (LIPITOR) 10 MG tablet Take 1 tablet (10 mg total) by mouth daily. (Patient not taking: Reported on 05/13/2019) 90 tablet 3   No facility-administered medications prior to visit.     ROS Review of Systems  Constitutional: Negative for activity change and appetite change.  HENT: Negative for sinus pressure and sore throat.   Eyes: Negative for visual disturbance.  Respiratory: Negative for cough, chest tightness and shortness of breath.   Cardiovascular: Negative for chest pain and leg swelling.  Gastrointestinal: Negative for abdominal distention, abdominal pain, constipation and diarrhea.  Endocrine: Negative.   Genitourinary: Negative for dysuria.  Musculoskeletal: Negative for joint swelling and myalgias.  Skin: Negative for rash.  Allergic/Immunologic: Negative.   Neurological: Negative for weakness, light-headedness and numbness.  Psychiatric/Behavioral: Negative for dysphoric mood and suicidal ideas.    Objective:  BP (!) 148/92   Pulse 90   Ht 6\' 1"  (1.854 m)   Wt 276 lb 9.6 oz (125.5 kg)   SpO2 99%   BMI 36.49 kg/m   BP/Weight 08/12/2019 05/13/2019 04/01/2019  Systolic BP 148 142 146  Diastolic BP 92 87 96  Wt. (Lbs) 276.6 281 -  BMI 36.49 37.07 -       Physical Exam Constitutional:      Appearance: He is well-developed.  Neck:     Vascular: No JVD.  Cardiovascular:     Rate and Rhythm: Normal rate.     Heart sounds: Normal heart sounds. No murmur heard.   Pulmonary:     Effort: Pulmonary effort is normal.     Breath sounds: Normal breath sounds. No wheezing or rales.  Chest:     Chest wall: No tenderness.  Abdominal:     General: Bowel sounds are normal. There is no distension.     Palpations: Abdomen is soft. There is no mass.     Tenderness: There is no abdominal tenderness.  Musculoskeletal:        General: Normal range of motion.     Right lower leg: No edema.     Left lower leg: No edema.     Comments: Hammertoes Right second toe amputation Ulcer on dorsal aspect of right third toe with no evidence of inflammation or infection  Neurological:     Mental Status: He is alert and oriented to person, place, and time.  Psychiatric:        Mood and Affect: Mood normal.     CMP Latest Ref Rng & Units 05/13/2019 02/05/2019 09/14/2018  Glucose 65 - 99 mg/dL 09/16/2018) 510(C) 585(I)  BUN 6 - 20 mg/dL 778(E) 16 42(P)  Creatinine 0.76 - 1.27 mg/dL 53(I) 1.44(R 1.54)  Sodium 134 - 144 mmol/L 139 139 137  Potassium 3.5 - 5.2 mmol/L 4.3 4.6 4.8  Chloride 96 - 106 mmol/L 101 103 106  CO2 20 - 29 mmol/L 23 26 20(L)  Calcium 8.7 - 10.2 mg/dL 0.08(Q 9.1 8.9  Total Protein 6.0 - 8.5 g/dL - 7.6 7.9  Total Bilirubin 0.0 - 1.2 mg/dL - 0.3 0.4  Alkaline Phos 39 - 117 IU/L - 111 71  AST 0 - 40 IU/L - 15 28  ALT 0 - 44 IU/L - 15 28    Lipid Panel     Component Value Date/Time   CHOL 216 (H) 02/05/2019 1029   TRIG 161 (H) 02/05/2019 1029   HDL 40 02/05/2019 1029   CHOLHDL 5.4 (H) 02/05/2019 1029   CHOLHDL 3.8 04/12/2017 0036   VLDL 18 04/12/2017 0036   LDLCALC 147 (H) 02/05/2019 1029    CBC    Component Value Date/Time   WBC 4.7 02/05/2019 1029   WBC 4.8 09/14/2018 0414   RBC 4.78 02/05/2019 1029   RBC 4.17 (L)  09/14/2018 0414   HGB 13.6 02/05/2019 1029   HCT 41.2 02/05/2019 1029   PLT 242 02/05/2019 1029   MCV 86 02/05/2019 1029   MCH 28.5 02/05/2019 1029   MCH 27.8 09/14/2018 0414   MCHC 33.0 02/05/2019 1029   MCHC 32.0 09/14/2018 0414   RDW 14.2 02/05/2019 1029   LYMPHSABS 2.2 02/05/2019 1029   MONOABS 0.4 09/11/2018 2122   EOSABS 0.1 02/05/2019 1029   BASOSABS 0.1  02/05/2019 1029    Lab Results  Component Value Date   HGBA1C 10.1 (A) 08/12/2019    Assessment & Plan:  1. Type 2 diabetes mellitus with other specified complication, with long-term current use of insulin (HCC) Uncontrolled with A1c of 10.1; goal is less than 7 Increase Lantus to 30 units in the morning and 26 in the evening He was previously unable to tolerate Victoza due to nausea and vomiting but is willing to try Trulicity which is once a week. CPP called to educate on Trulicity use; will return for blood sugar log review with CPP and if he is able to tolerate Trulicity consider increasing dose in 1 month Counseled on Diabetic diet, my plate method, 416 minutes of moderate intensity exercise/week Blood sugar logs with fasting goals of 80-120 mg/dl, random of less than 606 and in the event of sugars less than 60 mg/dl or greater than 301 mg/dl encouraged to notify the clinic. Advised on the need for annual eye exams, annual foot exams, Pneumonia vaccine. - POCT glucose (manual entry) - POCT glycosylated hemoglobin (Hb A1C) - Microalbumin / creatinine urine ratio - insulin NPH-regular Human (70-30) 100 UNIT/ML injection; 30 units with food in the morning and 26 units with dinner  Dispense: 30 mL; Refill: 4 - Insulin Pen Needle (TRUEPLUS PEN NEEDLES) 32G X 4 MM MISC; Use as directed to inject Trulicity  Dispense: 100 each; Refill: 11  2. Essential hypertension Controlled Unable to tolerate isosorbide Amlodipine added to regimen - lisinopril-hydrochlorothiazide (ZESTORETIC) 20-25 MG tablet; Take 1 tablet by mouth  daily.  Dispense: 90 tablet; Refill: 1  3. Need for hepatitis C screening test  - HCV RNA quant rflx ultra or genotyp([facility])  4. Type 2 diabetes mellitus with foot ulcer, with long-term current use of insulin (HCC) Ulcer is healing well with no evidence of infection Discussed foot care  5. Hyperlipidemia associated with type 2 diabetes mellitus (HCC) Uncontrolled Unable to tolerate atorvastatin due to headaches Advised to work on low-cholesterol diet and lifestyle modifications  Return in about 2 weeks (around 08/26/2019) for [facility] for blood sugar log reviewed; 3 months with PCP.       Hoy Register, MD, FAAFP. [facility] [place], [place] [phone]   08/12/2019, 11:47 AM

## 2019-08-13 LAB — MICROALBUMIN / CREATININE URINE RATIO
Creatinine, Urine: 165.2 mg/dL
Microalb/Creat Ratio: 439 mg/g creat — ABNORMAL HIGH (ref 0–29)
Microalbumin, Urine: 724.5 ug/mL

## 2019-08-13 LAB — HCV RNA QUANT RFLX ULTRA OR GENOTYP: HCV Quant Baseline: NOT DETECTED IU/mL

## 2019-08-19 MED FILL — NOVOLIN 70/30 100 UNITS/ML: (70-30) 100 | 35 days supply | Qty: 20 | Fill #0

## 2019-08-26 ENCOUNTER — Ambulatory Visit: Payer: Medicaid - Out of State | Admitting: Pharmacist

## 2019-09-09 MED FILL — TRULICITY 0.75 MG/0.5 ML PE: 0.75 | 28 days supply | Qty: 2 | Fill #1

## 2019-09-23 MED FILL — LISINOPRIL-HYDROCHLOROTHIAZ: 20-25 | 30 days supply | Qty: 30 | Fill #7

## 2019-09-23 MED FILL — $novoLIN 70/30 100 UNITS/ML: (70-30) 100 | 89 days supply | Qty: 50 | Fill #1

## 2019-10-07 MED FILL — TRULICITY 0.75 MG/0.5 ML PE: 0.75 | 28 days supply | Qty: 2 | Fill #2

## 2019-10-07 MED FILL — AMLODIPINE BESYLATE 10 MG T: 10 | 30 days supply | Qty: 30 | Fill #1

## 2019-10-23 MED FILL — LISINOPRIL-HYDROCHLOROTHIAZ: 20-25 | 30 days supply | Qty: 30 | Fill #8

## 2019-10-30 MED FILL — LISINOPRIL-HYDROCHLOROTHIAZ: 20-25 | 30 days supply | Qty: 30 | Fill #8

## 2019-11-12 ENCOUNTER — Ambulatory Visit: Payer: Medicaid - Out of State | Admitting: Family Medicine

## 2019-11-16 ENCOUNTER — Ambulatory Visit: Payer: Medicaid - Out of State | Admitting: Family Medicine

## 2019-11-27 MED FILL — LISINOPRIL-HYDROCHLOROTHIAZ: 20-25 | 30 days supply | Qty: 30 | Fill #9

## 2019-11-27 MED FILL — AMLODIPINE BESYLATE 10 MG T: 10 | 30 days supply | Qty: 30 | Fill #2

## 2019-12-18 IMAGING — DX DG ABD PORTABLE 1V
1 series · 1 of 1 positions shown · non-contrast
Comparison: None in PACs

CLINICAL DATA: Vomiting for the past 3 days.

EXAM:
PORTABLE ABDOMEN - 1 VIEW

[abdomen kub]
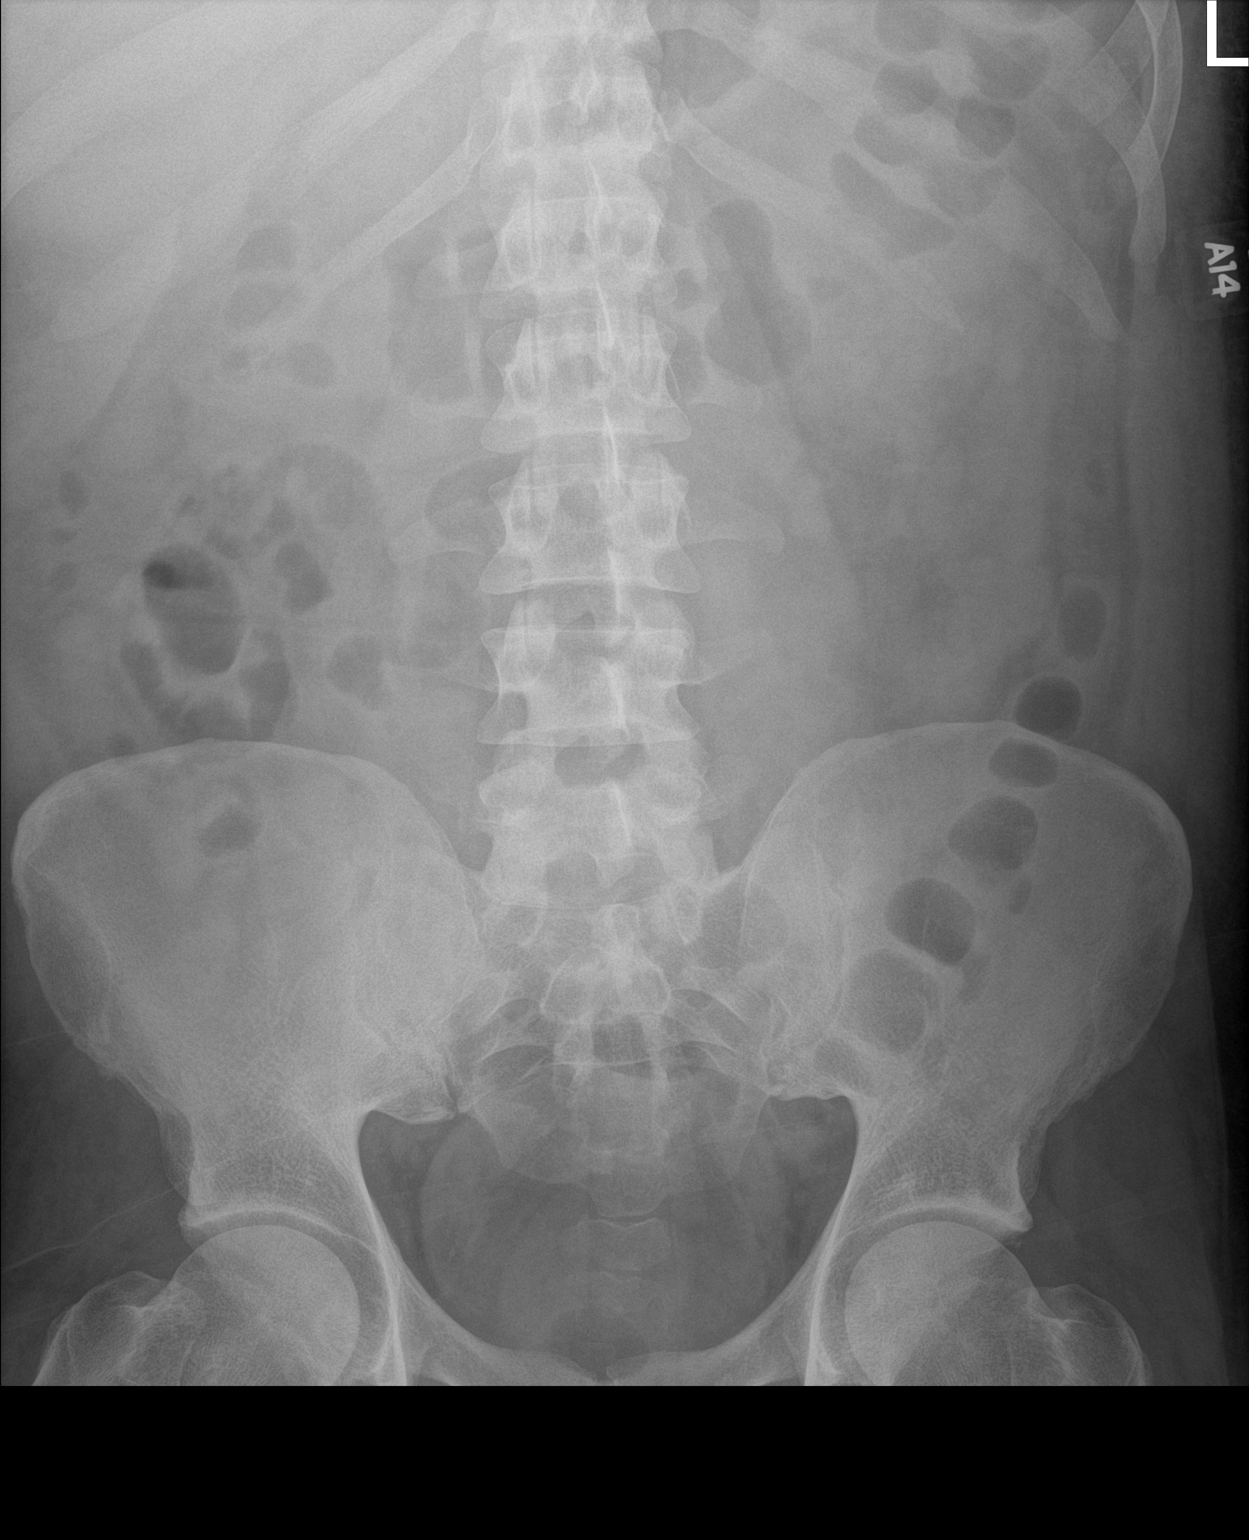

[1 of 1 positions shown; findings below may reference images not displayed]

FINDINGS: A single supine view of the abdomen and pelvis is reviewed. The
hemidiaphragms are excluded from the study. The bowel gas pattern is
within the limits of normal. There is no evidence of ileus or
obstruction. No free extraluminal gas collections are observed.
There are no abnormal soft tissue calcifications. The bony
structures are unremarkable.
IMPRESSION: No acute intra-abdominal abnormality is observed.

## 2019-12-31 MED FILL — LISINOPRIL-HYDROCHLOROTHIAZ: 20-25 | 30 days supply | Qty: 30 | Fill #10

## 2019-12-31 MED FILL — AMLODIPINE BESYLATE 10 MG T: 10 | 30 days supply | Qty: 30 | Fill #3

## 2019-12-31 MED FILL — $novoLIN 70/30 100 UNITS/ML: (70-30) 100 | 35 days supply | Qty: 20 | Fill #2

## 2020-01-05 ENCOUNTER — Other Ambulatory Visit: Payer: Self-pay | Admitting: Pharmacist

## 2020-01-05 ENCOUNTER — Other Ambulatory Visit: Payer: Self-pay

## 2020-01-05 ENCOUNTER — Ambulatory Visit: Payer: Medicaid - Out of State | Attending: Family Medicine | Admitting: Family Medicine

## 2020-01-05 ENCOUNTER — Encounter: Payer: Self-pay | Admitting: Family Medicine

## 2020-01-05 VITALS — BP 144/92 | HR 104 | Temp 98.8°F | Ht 73.0 in | Wt 282.0 lb

## 2020-01-05 DIAGNOSIS — Z794 Long term (current) use of insulin: Secondary | ICD-10-CM | POA: Diagnosis not present

## 2020-01-05 DIAGNOSIS — E1169 Type 2 diabetes mellitus with other specified complication: Secondary | ICD-10-CM

## 2020-01-05 DIAGNOSIS — Z23 Encounter for immunization: Secondary | ICD-10-CM

## 2020-01-05 DIAGNOSIS — I10 Essential (primary) hypertension: Secondary | ICD-10-CM | POA: Diagnosis not present

## 2020-01-05 LAB — POCT GLYCOSYLATED HEMOGLOBIN (HGB A1C): HbA1c, POC (controlled diabetic range): 8.7 % — AB (ref 0.0–7.0)

## 2020-01-05 LAB — GLUCOSE, POCT (MANUAL RESULT ENTRY): POC Glucose: 124 mg/dl — AB (ref 70–99)

## 2020-01-05 MED ORDER — TRULICITY 1.5 MG/0.5ML ~~LOC~~ SOAJ
1.5000 mg | SUBCUTANEOUS | 2 refills | Status: DC
Start: 2020-01-05 — End: 2020-01-05

## 2020-01-05 MED ORDER — AMLODIPINE BESYLATE 10 MG PO TABS: 10.0000 mg | ORAL_TABLET | Freq: Every day | ORAL | 6 refills | Status: DC

## 2020-01-05 MED ORDER — INSULIN NPH ISOPHANE & REGULAR (70-30) 100 UNIT/ML ~~LOC~~ SUSP: SUBCUTANEOUS | 4 refills | Status: DC

## 2020-01-05 MED ORDER — TRULICITY 1.5 MG/0.5ML ~~LOC~~ SOAJ
1.5000 mg | SUBCUTANEOUS | 3 refills | Status: DC
Start: 2020-01-05 — End: 2020-01-05

## 2020-01-05 MED ORDER — LISINOPRIL-HYDROCHLOROTHIAZIDE 20-25 MG PO TABS: 1.0000 | ORAL_TABLET | Freq: Every day | ORAL | 1 refills | Status: DC

## 2020-01-05 MED FILL — TRULICITY 1.5 MG/0.5 ML PEN: 1.5 | 28 days supply | Qty: 2 | Fill #0

## 2020-01-05 NOTE — Patient Instructions (Signed)
Diabetes Mellitus and Exercise Exercising regularly is important for your overall health, especially when you have diabetes (diabetes mellitus). Exercising is not only about losing weight. It has many other health benefits, such as increasing muscle strength and bone density and reducing body fat and stress. This leads to improved fitness, flexibility, and endurance, all of which result in better overall health. Exercise has additional benefits for people with diabetes, including:  Reducing appetite.  Helping to lower and control blood glucose.  Lowering blood pressure.  Helping to control amounts of fatty substances (lipids) in the blood, such as cholesterol and triglycerides.  Helping the body to respond better to insulin (improving insulin sensitivity).  Reducing how much insulin the body needs.  Decreasing the risk for heart disease by: ? Lowering cholesterol and triglyceride levels. ? Increasing the levels of good cholesterol. ? Lowering blood glucose levels. What is my activity plan? Your health care provider or certified diabetes educator can help you make a plan for the type and frequency of exercise (activity plan) that works for you. Make sure that you:  Do at least 150 minutes of moderate-intensity or vigorous-intensity exercise each week. This could be brisk walking, biking, or water aerobics. ? Do stretching and strength exercises, such as yoga or weightlifting, at least 2 times a week. ? Spread out your activity over at least 3 days of the week.  Get some form of physical activity every day. ? Do not go more than 2 days in a row without some kind of physical activity. ? Avoid being inactive for more than 30 minutes at a time. Take frequent breaks to walk or stretch.  Choose a type of exercise or activity that you enjoy, and set realistic goals.  Start slowly, and gradually increase the intensity of your exercise over time. What do I need to know about managing my  diabetes?   Check your blood glucose before and after exercising. ? If your blood glucose is 240 mg/dL (13.3 mmol/L) or higher before you exercise, check your urine for ketones. If you have ketones in your urine, do not exercise until your blood glucose returns to normal. ? If your blood glucose is 100 mg/dL (5.6 mmol/L) or lower, eat a snack containing 15-20 grams of carbohydrate. Check your blood glucose 15 minutes after the snack to make sure that your level is above 100 mg/dL (5.6 mmol/L) before you start your exercise.  Know the symptoms of low blood glucose (hypoglycemia) and how to treat it. Your risk for hypoglycemia increases during and after exercise. Common symptoms of hypoglycemia can include: ? Hunger. ? Anxiety. ? Sweating and feeling clammy. ? Confusion. ? Dizziness or feeling light-headed. ? Increased heart rate or palpitations. ? Blurry vision. ? Tingling or numbness around the mouth, lips, or tongue. ? Tremors or shakes. ? Irritability.  Keep a rapid-acting carbohydrate snack available before, during, and after exercise to help prevent or treat hypoglycemia.  Avoid injecting insulin into areas of the body that are going to be exercised. For example, avoid injecting insulin into: ? The arms, when playing tennis. ? The legs, when jogging.  Keep records of your exercise habits. Doing this can help you and your health care provider adjust your diabetes management plan as needed. Write down: ? Food that you eat before and after you exercise. ? Blood glucose levels before and after you exercise. ? The type and amount of exercise you have done. ? When your insulin is expected to peak, if you use   insulin. Avoid exercising at times when your insulin is peaking.  When you start a new exercise or activity, work with your health care provider to make sure the activity is safe for you, and to adjust your insulin, medicines, or food intake as needed.  Drink plenty of water while  you exercise to prevent dehydration or heat stroke. Drink enough fluid to keep your urine clear or pale yellow. Summary  Exercising regularly is important for your overall health, especially when you have diabetes (diabetes mellitus).  Exercising has many health benefits, such as increasing muscle strength and bone density and reducing body fat and stress.  Your health care provider or certified diabetes educator can help you make a plan for the type and frequency of exercise (activity plan) that works for you.  When you start a new exercise or activity, work with your health care provider to make sure the activity is safe for you, and to adjust your insulin, medicines, or food intake as needed. This information is not intended to replace advice given to you by your health care provider. Make sure you discuss any questions you have with your health care provider. Document Revised: 08/30/2016 Document Reviewed: 07/18/2015 Elsevier Patient Education  2020 Elsevier Inc.  

## 2020-01-05 NOTE — Progress Notes (Signed)
Subjective:  Patient ID: Jerry Arnold, male    DOB: [birth year]  Age: 34 y.o. MRN: [id number]  CC: Diabetes   HPI Jerry Arnold is a 34 year old male with a past medical history of Type 2DM(A1c 9.4), HTN,hyperlipidemia,status post right second toe amputation in 2/2019seen for follow-up visit A1c is 8.7 down from 9.4 previously and he endorses compliance with his medications.  Denies hypoglycemia or numbness in extremities. Fasting sugars range from 120-180; random sugars are around 170. He exercises by walking around his apartment complex a couple of times a week.  Compliant with his antihypertensive and his statin and denies adverse effects from his medications.  He has no additional concerns. Past Medical History:  Diagnosis Date  . Hypertension   . Osteomyelitis ([place]) 04/11/2017   with "second toe on right foot" injury x 2-3 weeks (04/11/2017)  . Type II diabetes mellitus ([place])     Past Surgical History:  Procedure Laterality Date  . AMPUTATION Right 04/13/2017   Procedure: SECOND FOOT RAY AMPUTATION;  Surgeon: Newt Minion, MD;  Location: [place];  Service: Orthopedics;  Laterality: Right;  . NO PAST SURGERIES      Family History  Problem Relation Age of Onset  . Hypertension Mother   . Diabetes Mother   . Hypertension Maternal Grandmother   . Hypertension Paternal Grandmother     No Known Allergies  Outpatient Medications Prior to Visit  Medication Sig Dispense Refill  . Insulin Pen Needle (TRUEPLUS PEN NEEDLES) 32G X 4 MM MISC Use as directed to inject Trulicity 856 each 11  . Insulin Syringe-Needle U-100 (TRUEPLUS INSULIN SYRINGE) 31G X 5/16" 0.3 ML MISC Use as directed to administer insulin twice daily 100 each 3  . amLODipine (NORVASC) 10 MG tablet Take 1 tablet (10 mg total) by mouth daily. 30 tablet 6  . Dulaglutide (TRULICITY) 3.14 HF/0.2OV SOPN Inject 0.5 mLs (0.75 mg total) into the skin once a week. 4 pen 3  . insulin NPH-regular Human (70-30) 100  UNIT/ML injection 30 units with food in the morning and 26 units with dinner 30 mL 4  . lisinopril-hydrochlorothiazide (ZESTORETIC) 20-25 MG tablet Take 1 tablet by mouth daily. 90 tablet 1  . acetaminophen (TYLENOL) 500 MG tablet Take 1 tablet (500 mg total) by mouth every 6 (six) hours as needed. (Patient not taking: Reported on 02/05/2019) 30 tablet 0  . acyclovir (ZOVIRAX) 400 MG tablet Take 1 tablet (400 mg total) by mouth 2 (two) times daily. Prn outbreak (Patient not taking: Reported on 08/12/2019) 50 tablet 1   No facility-administered medications prior to visit.     ROS Review of Systems  Constitutional: Negative for activity change and appetite change.  HENT: Negative for sinus pressure and sore throat.   Eyes: Negative for visual disturbance.  Respiratory: Negative for cough, chest tightness and shortness of breath.   Cardiovascular: Negative for chest pain and leg swelling.  Gastrointestinal: Negative for abdominal distention, abdominal pain, constipation and diarrhea.  Endocrine: Negative.   Genitourinary: Negative for dysuria.  Musculoskeletal: Negative for joint swelling and myalgias.  Skin: Negative for rash.  Allergic/Immunologic: Negative.   Neurological: Negative for weakness, light-headedness and numbness.  Psychiatric/Behavioral: Negative for dysphoric mood and suicidal ideas.    Objective:  BP (!) 144/92   Pulse (!) 104   Temp 98.8 F (37.1 C) (Oral)   Ht '6\' 1"'  (1.854 m)   Wt 282 lb (127.9 kg)   SpO2 100%   BMI 37.21 kg/m   BP/Weight  01/05/2020 08/12/2019 05/28/1446  Systolic BP 185 631 497  Diastolic BP 92 92 87  Wt. (Lbs) 282 276.6 281  BMI 37.21 36.49 37.07      Physical Exam Constitutional:      Appearance: He is well-developed.  Neck:     Vascular: No JVD.  Cardiovascular:     Rate and Rhythm: Tachycardia present.     Heart sounds: Normal heart sounds. No murmur heard.   Pulmonary:     Effort: Pulmonary effort is normal.     Breath  sounds: Normal breath sounds. No wheezing or rales.  Chest:     Chest wall: No tenderness.  Abdominal:     General: Bowel sounds are normal. There is no distension.     Palpations: Abdomen is soft. There is no mass.     Tenderness: There is no abdominal tenderness.  Musculoskeletal:        General: Normal range of motion.     Right lower leg: No edema.     Left lower leg: No edema.  Neurological:     Mental Status: He is alert and oriented to person, place, and time.  Psychiatric:        Mood and Affect: Mood normal.     CMP Latest Ref Rng & Units 05/13/2019 02/05/2019 09/14/2018  Glucose 65 - 99 mg/dL 134(H) 175(H) 314(H)  BUN 6 - 20 mg/dL 26(H) 16 27(H)  Creatinine 0.76 - 1.27 mg/dL 1.51(H) 1.27 1.34(H)  Sodium 134 - 144 mmol/L 139 139 137  Potassium 3.5 - 5.2 mmol/L 4.3 4.6 4.8  Chloride 96 - 106 mmol/L 101 103 106  CO2 20 - 29 mmol/L 23 26 20(L)  Calcium 8.7 - 10.2 mg/dL 10.0 9.1 8.9  Total Protein 6.0 - 8.5 g/dL - 7.6 7.9  Total Bilirubin 0.0 - 1.2 mg/dL - 0.3 0.4  Alkaline Phos 39 - 117 IU/L - 111 71  AST 0 - 40 IU/L - 15 28  ALT 0 - 44 IU/L - 15 28    Lipid Panel     Component Value Date/Time   CHOL 216 (H) 02/05/2019 1029   TRIG 161 (H) 02/05/2019 1029   HDL 40 02/05/2019 1029   CHOLHDL 5.4 (H) 02/05/2019 1029   CHOLHDL 3.8 04/12/2017 0036   VLDL 18 04/12/2017 0036   LDLCALC 147 (H) 02/05/2019 1029    CBC    Component Value Date/Time   WBC 4.7 02/05/2019 1029   WBC 4.8 09/14/2018 0414   RBC 4.78 02/05/2019 1029   RBC 4.17 (L) 09/14/2018 0414   HGB 13.6 02/05/2019 1029   HCT 41.2 02/05/2019 1029   PLT 242 02/05/2019 1029   MCV 86 02/05/2019 1029   MCH 28.5 02/05/2019 1029   MCH 27.8 09/14/2018 0414   MCHC 33.0 02/05/2019 1029   MCHC 32.0 09/14/2018 0414   RDW 14.2 02/05/2019 1029   LYMPHSABS 2.2 02/05/2019 1029   MONOABS 0.4 09/11/2018 2122   EOSABS 0.1 02/05/2019 1029   BASOSABS 0.1 02/05/2019 1029    Lab Results  Component Value Date    HGBA1C 8.7 (A) 01/05/2020    Assessment & Plan:  1. Type 2 diabetes mellitus with other specified complication, with long-term current use of insulin (HCC) Uncontrolled with A1c of 8.7 but this has trended down from 9.4 previously Commended on improvement Trulicity dose increased Counseled on Diabetic diet, my plate method, 026 minutes of moderate intensity exercise/week Blood sugar logs with fasting goals of 80-120 mg/dl, random of less than  180 and in the event of sugars less than 60 mg/dl or greater than 400 mg/dl encouraged to notify the clinic. Advised on the need for annual eye exams, annual foot exams, Pneumonia vaccine. - POCT glucose (manual entry) - POCT glycosylated hemoglobin (Hb A1C) - insulin NPH-regular Human (70-30) 100 UNIT/ML injection; 30 units with food in the morning and 26 units with dinner  Dispense: 30 mL; Refill: 4  2. Essential hypertension Slightly above goal No regimen change today but advised to work on lifestyle modification Counseled on blood pressure goal of less than 130/80, low-sodium, DASH diet, medication compliance, 150 minutes of moderate intensity exercise per week. Discussed medication compliance, adverse effects. - CMP14+EGFR - lisinopril-hydrochlorothiazide (ZESTORETIC) 20-25 MG tablet; Take 1 tablet by mouth daily.  Dispense: 90 tablet; Refill: 1 - amLODipine (NORVASC) 10 MG tablet; Take 1 tablet (10 mg total) by mouth daily.  Dispense: 30 tablet; Refill: 6  3. Need for immunization against influenza - Flu Vaccine QUAD 36+ mos IM    Meds ordered this encounter  Medications  . DISCONTD: Dulaglutide (TRULICITY) 1.5 JY/7.8GN SOPN    Sig: Inject 1.5 mg into the skin once a week.    Dispense:  12 mL    Refill:  3    Dose increase  . lisinopril-hydrochlorothiazide (ZESTORETIC) 20-25 MG tablet    Sig: Take 1 tablet by mouth daily.    Dispense:  90 tablet    Refill:  1  . insulin NPH-regular Human (70-30) 100 UNIT/ML injection    Sig: 30  units with food in the morning and 26 units with dinner    Dispense:  30 mL    Refill:  4    Discontinue previous dose  . amLODipine (NORVASC) 10 MG tablet    Sig: Take 1 tablet (10 mg total) by mouth daily.    Dispense:  30 tablet    Refill:  6    Follow-up: Return in about 3 months (around 04/06/2020) for Chronic disease management.       Charlott Rakes, MD, FAAFP. [facility] and [place], [place]   01/05/2020, 5:19 PM

## 2020-01-06 LAB — CMP14+EGFR
ALT: 24 IU/L (ref 0–44)
AST: 15 IU/L (ref 0–40)
Albumin/Globulin Ratio: 1.2 (ref 1.2–2.2)
Albumin: 4.4 g/dL (ref 4.0–5.0)
Alkaline Phosphatase: 118 IU/L (ref 44–121)
BUN/Creatinine Ratio: 14 (ref 9–20)
BUN: 22 mg/dL — ABNORMAL HIGH (ref 6–20)
Bilirubin Total: 0.2 mg/dL (ref 0.0–1.2)
CO2: 25 mmol/L (ref 20–29)
Calcium: 9.7 mg/dL (ref 8.7–10.2)
Chloride: 101 mmol/L (ref 96–106)
Creatinine, Ser: 1.61 mg/dL — ABNORMAL HIGH (ref 0.76–1.27)
GFR calc Af Amer: 63 mL/min/{1.73_m2} (ref >59)
GFR calc non Af Amer: 55 mL/min/{1.73_m2} — ABNORMAL LOW (ref >59)
Globulin, Total: 3.7 g/dL (ref 1.5–4.5)
Glucose: 119 mg/dL — ABNORMAL HIGH (ref 65–99)
Potassium: 4.5 mmol/L (ref 3.5–5.2)
Sodium: 139 mmol/L (ref 134–144)
Total Protein: 8.1 g/dL (ref 6.0–8.5)

## 2020-01-25 MED FILL — AMLODIPINE BESYLATE 10 MG T: 10 | 30 days supply | Qty: 30 | Fill #4

## 2020-01-25 MED FILL — LISINOPRIL-HYDROCHLOROTHIAZ: 20-25 | 30 days supply | Qty: 30 | Fill #11

## 2020-02-17 MED FILL — $novoLIN 70/30 100 UNITS/ML: (70-30) 100 | 35 days supply | Qty: 20 | Fill #3

## 2020-03-22 MED FILL — AMLODIPINE BESYLATE 10 MG T: 10 | 30 days supply | Qty: 30 | Fill #5

## 2020-03-22 MED FILL — $novoLIN 70/30 100 UNITS/ML: (70-30) 100 | 35 days supply | Qty: 20 | Fill #4

## 2020-03-22 MED FILL — LISINOPRIL-HYDROCHLOROTHIAZ: 20-25 | 30 days supply | Qty: 30 | Fill #0

## 2020-04-06 ENCOUNTER — Ambulatory Visit: Payer: Medicaid - Out of State | Admitting: Family Medicine

## 2020-04-26 MED FILL — $novoLIN 70/30 100 UNITS/ML: (70-30) 100 | 35 days supply | Qty: 20 | Fill #5

## 2020-04-26 MED FILL — AMLODIPINE BESYLATE 10 MG T: 10 | 30 days supply | Qty: 30 | Fill #6

## 2020-04-26 MED FILL — LISINOPRIL-HYDROCHLOROTHIAZ: 20-25 | 30 days supply | Qty: 30 | Fill #1

## 2020-05-10 ENCOUNTER — Telehealth: Payer: Self-pay | Admitting: Family Medicine

## 2020-05-10 NOTE — Telephone Encounter (Signed)
This is the second time I have given patient [facility]'s # to set up shipment for medication.  Pharmacy info is also on the medication box that is delivered to the patient.  Advised patient that his enrollment is approved until 10/18/20 for Trulicity 1.5mg  and he is to call and set up shipment for refills.

## 2020-05-10 NOTE — Telephone Encounter (Signed)
Copied from CRM [id number]. Topic: General - Call Back - No Documentation >> May 06, 2020  1:16 PM Randol Kern wrote: Best contact: [phone]   Pt is requesting a call back from "patient assistance" states he has not received his shipment of trulicity.

## 2020-05-21 ENCOUNTER — Other Ambulatory Visit: Payer: Self-pay

## 2020-05-22 ENCOUNTER — Other Ambulatory Visit: Payer: Self-pay

## 2020-05-22 MED ORDER — NOVOLIN 70/30 (70-30) 100 UNIT/ML ~~LOC~~ SUSP: 56.0000 [IU] | Freq: Every day | SUBCUTANEOUS | 11 refills | Status: DC

## 2020-05-30 ENCOUNTER — Other Ambulatory Visit: Payer: Self-pay | Admitting: Family Medicine

## 2020-05-30 DIAGNOSIS — I10 Essential (primary) hypertension: Secondary | ICD-10-CM

## 2020-05-30 MED ORDER — NOVOLIN 70/30 (70-30) 100 UNIT/ML ~~LOC~~ SUSP
56.0000 [IU] | Freq: Every day | SUBCUTANEOUS | 11 refills | Status: DC
  Filled 2020-05-30: qty 10, 17d supply, fill #0
  Filled 2020-06-17: qty 10, 17d supply, fill #1
  Filled 2020-07-08 – 2020-07-15 (×2): qty 20, 35d supply, fill #2
  Filled 2020-07-15: qty 10, 17d supply, fill #2
  Filled 2020-08-17: qty 50, 89d supply, fill #3
  Filled 2020-11-02: qty 20, 35d supply, fill #4
  Filled 2020-11-02: qty 50, 89d supply, fill #4
  Filled 2020-12-05: qty 10, 17d supply, fill #4

## 2020-05-30 MED ORDER — LISINOPRIL-HYDROCHLOROTHIAZIDE 20-25 MG PO TABS
1.0000 | ORAL_TABLET | Freq: Every day | ORAL | 0 refills | Status: DC
  Filled 2020-05-30: qty 30, 30d supply, fill #0

## 2020-05-30 MED ORDER — AMLODIPINE BESYLATE 10 MG PO TABS
10.0000 mg | ORAL_TABLET | Freq: Every day | ORAL | 0 refills | Status: DC
  Filled 2020-05-30: qty 30, 30d supply, fill #0

## 2020-05-30 NOTE — Telephone Encounter (Signed)
Medication: Rx #: [id number] insulin NPH-regular Human (NOVOLIN 70/30) (70-30) 100 UNIT/ML injection [[id number]] , amLODipine (NORVASC) 10 MG tablet [[id number]] , Rx #: [id number] , lisinopril-hydrochlorothiazide (ZESTORETIC) 20-25 MG tablet [[id number]]   Has the patient contacted their pharmacy? yes (Agent: If no, request that the patient contact the pharmacy for the refill.) (Agent: If yes, when and what did the pharmacy advise?)  Preferred Pharmacy (with phone number or street name): [facility] [address] [place] [address] Phone: [phone] Fax: [phone] Hours: M-F 8:30a-5:30p    Agent: Please be advised that RX refills may take up to 3 business days. We ask that you follow-up with your pharmacy.

## 2020-05-31 ENCOUNTER — Other Ambulatory Visit: Payer: Self-pay

## 2020-06-01 ENCOUNTER — Other Ambulatory Visit: Payer: Self-pay

## 2020-06-17 ENCOUNTER — Other Ambulatory Visit: Payer: Self-pay

## 2020-06-29 ENCOUNTER — Encounter: Payer: Self-pay | Admitting: Family Medicine

## 2020-06-29 ENCOUNTER — Other Ambulatory Visit: Payer: Self-pay

## 2020-06-29 ENCOUNTER — Ambulatory Visit: Payer: Self-pay | Attending: Family Medicine | Admitting: Family Medicine

## 2020-06-29 VITALS — BP 128/90 | HR 104 | Ht 73.0 in | Wt 278.0 lb

## 2020-06-29 DIAGNOSIS — Z6837 Body mass index (BMI) 37.0-37.9, adult: Secondary | ICD-10-CM

## 2020-06-29 DIAGNOSIS — I10 Essential (primary) hypertension: Secondary | ICD-10-CM

## 2020-06-29 DIAGNOSIS — Z794 Long term (current) use of insulin: Secondary | ICD-10-CM

## 2020-06-29 DIAGNOSIS — E1169 Type 2 diabetes mellitus with other specified complication: Secondary | ICD-10-CM

## 2020-06-29 DIAGNOSIS — R519 Headache, unspecified: Secondary | ICD-10-CM

## 2020-06-29 LAB — POCT GLYCOSYLATED HEMOGLOBIN (HGB A1C): HbA1c, POC (controlled diabetic range): 8.1 % — AB (ref 0.0–7.0)

## 2020-06-29 LAB — GLUCOSE, POCT (MANUAL RESULT ENTRY): POC Glucose: 192 mg/dl — AB (ref 70–99)

## 2020-06-29 MED ORDER — AMLODIPINE BESYLATE 10 MG PO TABS
10.0000 mg | ORAL_TABLET | Freq: Every day | ORAL | 0 refills | Status: DC
  Filled 2020-06-29: qty 90, 90d supply, fill #0

## 2020-06-29 MED ORDER — TRULICITY 3 MG/0.5ML ~~LOC~~ SOAJ
3.0000 mg | SUBCUTANEOUS | 6 refills | Status: DC
  Filled 2020-06-29: qty 2, 28d supply, fill #0

## 2020-06-29 MED ORDER — INSULIN NPH ISOPHANE & REGULAR (70-30) 100 UNIT/ML ~~LOC~~ SUSP
SUBCUTANEOUS | 4 refills | Status: DC
  Filled 2020-06-29: qty 30, fill #0

## 2020-06-29 MED ORDER — LISINOPRIL-HYDROCHLOROTHIAZIDE 20-25 MG PO TABS
1.0000 | ORAL_TABLET | Freq: Every day | ORAL | 0 refills | Status: DC
Start: 2020-06-29 — End: 2020-10-03
  Filled 2020-06-29: qty 90, 90d supply, fill #0

## 2020-06-29 NOTE — Progress Notes (Signed)
Subjective:  Patient ID: Jerry Arnold, male    DOB: Jan 03, 1986  Age: 35 y.o. MRN: [id number]  CC: Diabetes   HPI Pheonix Arnold  is a 35 year old male with a past medical history of Type 2DM(A1c 8.1), HTN,hyperlipidemia,status post right second toe amputation in 2/2019seen for follow-up visit  Interval History: A1c is 8.1 down from 8.7 previously.  Tolerating Trulicity with no adverse effects to medications. He complains of headaches which occur 2-3 x/week for the last 3 weeks and he attributes it to Amlodipine. Pain is in his occiput and neck. He is also unsure if it is from his pillow. He has been on Amlodipine for 1 year. He had cataract surgery on 04/19/20 in [place] and was last seen there 2 weeks ago for follow-up.  Compliant with his statin and he endorses exercising regularly by walking around his apartment complex. Past Medical History:  Diagnosis Date  . Hypertension   . Osteomyelitis ([place]) 04/11/2017   with "second toe on right foot" injury x 2-3 weeks (04/11/2017)  . Type II diabetes mellitus ([place])     Past Surgical History:  Procedure Laterality Date  . AMPUTATION Right 04/13/2017   Procedure: SECOND FOOT RAY AMPUTATION;  Surgeon: Newt Minion, MD;  Location: [facility];  Service: Orthopedics;  Laterality: Right;  . NO PAST SURGERIES      Family History  Problem Relation Age of Onset  . Hypertension Mother   . Diabetes Mother   . Hypertension Maternal Grandmother   . Hypertension Paternal Grandmother     No Known Allergies  Outpatient Medications Prior to Visit  Medication Sig Dispense Refill  . insulin NPH-regular Human (NOVOLIN 70/30) (70-30) 100 UNIT/ML injection INJECT 30 UNITS INTO THE SKIN WITH FOOD IN THE MORNING AND THEN INJECT 26 UNITS WITH DINNER 16.8 mL 11  . Insulin Pen Needle (TRUEPLUS PEN NEEDLES) 32G X 4 MM MISC Use as directed to inject Trulicity 761 each 11  . Insulin Syringe-Needle U-100 (TRUEPLUS INSULIN SYRINGE) 31G X 5/16" 0.3 ML  MISC Use as directed to administer insulin twice daily 100 each 3  . amLODipine (NORVASC) 10 MG tablet Take 1 tablet (10 mg total) by mouth daily. 90 tablet 0  . Dulaglutide 1.5 MG/0.5ML SOPN INJECT 1.5 MG INTO THE SKIN ONCE A WEEK. 6 mL 3  . insulin NPH-regular Human (70-30) 100 UNIT/ML injection 30 units with food in the morning and 26 units with dinner 30 mL 4  . lisinopril-hydrochlorothiazide (ZESTORETIC) 20-25 MG tablet TAKE 1 TABLET BY MOUTH DAILY. 90 tablet 0  . acetaminophen (TYLENOL) 500 MG tablet Take 1 tablet (500 mg total) by mouth every 6 (six) hours as needed. (Patient not taking: No sig reported) 30 tablet 0  . acyclovir (ZOVIRAX) 400 MG tablet Take 1 tablet (400 mg total) by mouth 2 (two) times daily. Prn outbreak (Patient not taking: No sig reported) 50 tablet 1   No facility-administered medications prior to visit.     ROS Review of Systems  Constitutional: Negative for activity change and appetite change.  HENT: Negative for sinus pressure and sore throat.   Eyes: Negative for visual disturbance.  Respiratory: Negative for cough, chest tightness and shortness of breath.   Cardiovascular: Negative for chest pain and leg swelling.  Gastrointestinal: Negative for abdominal distention, abdominal pain, constipation and diarrhea.  Endocrine: Negative.   Genitourinary: Negative for dysuria.  Musculoskeletal: Positive for neck pain. Negative for joint swelling and myalgias.  Skin: Negative for rash.  Allergic/Immunologic: Negative.  Neurological: Positive for headaches. Negative for weakness, light-headedness and numbness.  Psychiatric/Behavioral: Negative for dysphoric mood and suicidal ideas.    Objective:  BP 128/90   Pulse (!) 104   Ht '6\' 1"'  (1.854 m)   Wt 278 lb (126.1 kg)   SpO2 97%   BMI 36.68 kg/m   BP/Weight 06/29/2020 01/05/2020 07/07/8414  Systolic BP 606 301 601  Diastolic BP 90 92 92  Wt. (Lbs) 278 282 276.6  BMI 36.68 37.21 36.49      Physical  Exam Constitutional:      Appearance: He is well-developed.  Neck:     Vascular: No JVD.  Cardiovascular:     Rate and Rhythm: Tachycardia present.     Heart sounds: Normal heart sounds. No murmur heard.   Pulmonary:     Effort: Pulmonary effort is normal.     Breath sounds: Normal breath sounds. No wheezing or rales.  Chest:     Chest wall: No tenderness.  Abdominal:     General: Bowel sounds are normal. There is no distension.     Palpations: Abdomen is soft. There is no mass.     Tenderness: There is no abdominal tenderness.  Musculoskeletal:        General: Normal range of motion.     Right lower leg: No edema.     Left lower leg: No edema.  Neurological:     Mental Status: He is alert and oriented to person, place, and time.  Psychiatric:        Mood and Affect: Mood normal.     CMP Latest Ref Rng & Units 01/05/2020 05/13/2019 02/05/2019  Glucose 65 - 99 mg/dL 119(H) 134(H) 175(H)  BUN 6 - 20 mg/dL 22(H) 26(H) 16  Creatinine 0.76 - 1.27 mg/dL 1.61(H) 1.51(H) 1.27  Sodium 134 - 144 mmol/L 139 139 139  Potassium 3.5 - 5.2 mmol/L 4.5 4.3 4.6  Chloride 96 - 106 mmol/L 101 101 103  CO2 20 - 29 mmol/L '25 23 26  ' Calcium 8.7 - 10.2 mg/dL 9.7 10.0 9.1  Total Protein 6.0 - 8.5 g/dL 8.1 - 7.6  Total Bilirubin 0.0 - 1.2 mg/dL 0.2 - 0.3  Alkaline Phos 44 - 121 IU/L 118 - 111  AST 0 - 40 IU/L 15 - 15  ALT 0 - 44 IU/L 24 - 15    Lipid Panel     Component Value Date/Time   CHOL 216 (H) 02/05/2019 1029   TRIG 161 (H) 02/05/2019 1029   HDL 40 02/05/2019 1029   CHOLHDL 5.4 (H) 02/05/2019 1029   CHOLHDL 3.8 04/12/2017 0036   VLDL 18 04/12/2017 0036   LDLCALC 147 (H) 02/05/2019 1029    CBC    Component Value Date/Time   WBC 4.7 02/05/2019 1029   WBC 4.8 09/14/2018 0414   RBC 4.78 02/05/2019 1029   RBC 4.17 (L) 09/14/2018 0414   HGB 13.6 02/05/2019 1029   HCT 41.2 02/05/2019 1029   PLT 242 02/05/2019 1029   MCV 86 02/05/2019 1029   MCH 28.5 02/05/2019 1029   MCH  27.8 09/14/2018 0414   MCHC 33.0 02/05/2019 1029   MCHC 32.0 09/14/2018 0414   RDW 14.2 02/05/2019 1029   LYMPHSABS 2.2 02/05/2019 1029   MONOABS 0.4 09/11/2018 2122   EOSABS 0.1 02/05/2019 1029   BASOSABS 0.1 02/05/2019 1029    Lab Results  Component Value Date   HGBA1C 8.1 (A) 06/29/2020    Assessment & Plan:  1. Type 2 diabetes  mellitus with other specified complication, with long-term current use of insulin ([place]) Uncontrolled with A1c of 8.1 which has improved compared to previous labs; goal is less than 7.0 Increase Trulicity dose Counseled on Diabetic diet, my plate method, 498 minutes of moderate intensity exercise/week Blood sugar logs with fasting goals of 80-120 mg/dl, random of less than 180 and in the event of sugars less than 60 mg/dl or greater than 400 mg/dl encouraged to notify the clinic. Advised on the need for annual eye exams, annual foot exams, Pneumonia vaccine. - POCT glucose (manual entry) - POCT glycosylated hemoglobin (Hb A1C) - Lipid panel; Future - CMP14+EGFR; Future - Microalbumin / creatinine urine ratio; Future - Dulaglutide (TRULICITY) 3 YM/4.1RA SOPN; Inject 3 mg as directed once a week.  Dispense: 2 mL; Refill: 6 - insulin NPH-regular Human (70-30) 100 UNIT/ML injection; 30 units with food in the morning and 26 units with dinner  Dispense: 30 mL; Refill: 4  2. Occipital headache He does have associated neck pain This could be positional He will try to change his bedding and if headaches persist we will reach back out so we can consider substituting amlodipine with another antihypertensive  3. Essential hypertension Controlled Counseled on blood pressure goal of less than 130/80, low-sodium, DASH diet, medication compliance, 150 minutes of moderate intensity exercise per week. Discussed medication compliance, adverse effects. - amLODipine (NORVASC) 10 MG tablet; Take 1 tablet (10 mg total) by mouth daily.  Dispense: 90 tablet; Refill: 0 -  lisinopril-hydrochlorothiazide (ZESTORETIC) 20-25 MG tablet; TAKE 1 TABLET BY MOUTH DAILY.  Dispense: 90 tablet; Refill: 0  4. Class 2 severe obesity due to excess calories with serious comorbidity and body mass index (BMI) of 37.0 to 37.9 in adult [facility]) Counseled on increasing physical exercise, reducing portion sizes and avoiding late meals    Meds ordered this encounter  Medications  . Dulaglutide (TRULICITY) 3 XE/9.4MH SOPN    Sig: Inject 3 mg as directed once a week.    Dispense:  2 mL    Refill:  6    Dose increase  . amLODipine (NORVASC) 10 MG tablet    Sig: Take 1 tablet (10 mg total) by mouth daily.    Dispense:  90 tablet    Refill:  0  . insulin NPH-regular Human (70-30) 100 UNIT/ML injection    Sig: 30 units with food in the morning and 26 units with dinner    Dispense:  30 mL    Refill:  4  . lisinopril-hydrochlorothiazide (ZESTORETIC) 20-25 MG tablet    Sig: TAKE 1 TABLET BY MOUTH DAILY.    Dispense:  90 tablet    Refill:  0    Follow-up: Return in about 3 months (around 09/29/2020) for Chronic disease management.       Charlott Rakes, MD, FAAFP. [facility] [place], [place]   06/29/2020, 4:53 PM

## 2020-06-29 NOTE — Patient Instructions (Signed)
Calorie Counting for Weight Loss Calories are units of energy. Your body needs a certain number of calories from food to keep going throughout the day. When you eat or drink more calories than your body needs, your body stores the extra calories mostly as fat. When you eat or drink fewer calories than your body needs, your body burns fat to get the energy it needs. Calorie counting means keeping track of how many calories you eat and drink each day. Calorie counting can be helpful if you need to lose weight. If you eat fewer calories than your body needs, you should lose weight. Ask your health care provider what a healthy weight is for you. For calorie counting to work, you will need to eat the right number of calories each day to lose a healthy amount of weight per week. A dietitian can help you figure out how many calories you need in a day and will suggest ways to reach your calorie goal.  A healthy amount of weight to lose each week is usually 1-2 lb (0.5-0.9 kg). This usually means that your daily calorie intake should be reduced by 500-750 calories.  Eating 1,200-1,500 calories a day can help most women lose weight.  Eating 1,500-1,800 calories a day can help most men lose weight. What do I need to know about calorie counting? Work with your health care provider or dietitian to determine how many calories you should get each day. To meet your daily calorie goal, you will need to:  Find out how many calories are in each food that you would like to eat. Try to do this before you eat.  Decide how much of the food you plan to eat.  Keep a food log. Do this by writing down what you ate and how many calories it had. To successfully lose weight, it is important to balance calorie counting with a healthy lifestyle that includes regular activity. Where do I find calorie information? The number of calories in a food can be found on a Nutrition Facts label. If a food does not have a Nutrition Facts  label, try to look up the calories online or ask your dietitian for help. Remember that calories are listed per serving. If you choose to have more than one serving of a food, you will have to multiply the calories per serving by the number of servings you plan to eat. For example, the label on a package of bread might say that a serving size is 1 slice and that there are 90 calories in a serving. If you eat 1 slice, you will have eaten 90 calories. If you eat 2 slices, you will have eaten 180 calories.   How do I keep a food log? After each time that you eat, record the following in your food log as soon as possible:  What you ate. Be sure to include toppings, sauces, and other extras on the food.  How much you ate. This can be measured in cups, ounces, or number of items.  How many calories were in each food and drink.  The total number of calories in the food you ate. Keep your food log near you, such as in a pocket-sized notebook or on an app or website on your mobile phone. Some programs will calculate calories for you and show you how many calories you have left to meet your daily goal. What are some portion-control tips?  Know how many calories are in a serving. This will   help you know how many servings you can have of a certain food.  Use a measuring cup to measure serving sizes. You could also try weighing out portions on a kitchen scale. With time, you will be able to estimate serving sizes for some foods.  Take time to put servings of different foods on your favorite plates or in your favorite bowls and cups so you know what a serving looks like.  Try not to eat straight from a food's packaging, such as from a bag or box. Eating straight from the package makes it hard to see how much you are eating and can lead to overeating. Put the amount you would like to eat in a cup or on a plate to make sure you are eating the right portion.  Use smaller plates, glasses, and bowls for smaller  portions and to prevent overeating.  Try not to multitask. For example, avoid watching TV or using your computer while eating. If it is time to eat, sit down at a table and enjoy your food. This will help you recognize when you are full. It will also help you be more mindful of what and how much you are eating. What are tips for following this plan? Reading food labels  Check the calorie count compared with the serving size. The serving size may be smaller than what you are used to eating.  Check the source of the calories. Try to choose foods that are high in protein, fiber, and vitamins, and low in saturated fat, trans fat, and sodium. Shopping  Read nutrition labels while you shop. This will help you make healthy decisions about which foods to buy.  Pay attention to nutrition labels for low-fat or fat-free foods. These foods sometimes have the same number of calories or more calories than the full-fat versions. They also often have added sugar, starch, or salt to make up for flavor that was removed with the fat.  Make a grocery list of lower-calorie foods and stick to it. Cooking  Try to cook your favorite foods in a healthier way. For example, try baking instead of frying.  Use low-fat dairy products. Meal planning  Use more fruits and vegetables. One-half of your plate should be fruits and vegetables.  Include lean proteins, such as chicken, turkey, and fish. Lifestyle Each week, aim to do one of the following:  150 minutes of moderate exercise, such as walking.  75 minutes of vigorous exercise, such as running. General information  Know how many calories are in the foods you eat most often. This will help you calculate calorie counts faster.  Find a way of tracking calories that works for you. Get creative. Try different apps or programs if writing down calories does not work for you. What foods should I eat?  Eat nutritious foods. It is better to have a nutritious,  high-calorie food, such as an avocado, than a food with few nutrients, such as a bag of potato chips.  Use your calories on foods and drinks that will fill you up and will not leave you hungry soon after eating. ? Examples of foods that fill you up are nuts and nut butters, vegetables, lean proteins, and high-fiber foods such as whole grains. High-fiber foods are foods with more than 5 g of fiber per serving.  Pay attention to calories in drinks. Low-calorie drinks include water and unsweetened drinks. The items listed above may not be a complete list of foods and beverages you can eat.   Contact a dietitian for more information.   What foods should I limit? Limit foods or drinks that are not good sources of vitamins, minerals, or protein or that are high in unhealthy fats. These include:  Candy.  Other sweets.  Sodas, specialty coffee drinks, alcohol, and juice. The items listed above may not be a complete list of foods and beverages you should avoid. Contact a dietitian for more information. How do I count calories when eating out?  Pay attention to portions. Often, portions are much larger when eating out. Try these tips to keep portions smaller: ? Consider sharing a meal instead of getting your own. ? If you get your own meal, eat only half of it. Before you start eating, ask for a container and put half of your meal into it. ? When available, consider ordering smaller portions from the menu instead of full portions.  Pay attention to your food and drink choices. Knowing the way food is cooked and what is included with the meal can help you eat fewer calories. ? If calories are listed on the menu, choose the lower-calorie options. ? Choose dishes that include vegetables, fruits, whole grains, low-fat dairy products, and lean proteins. ? Choose items that are boiled, broiled, grilled, or steamed. Avoid items that are buttered, battered, fried, or served with cream sauce. Items labeled as  crispy are usually fried, unless stated otherwise. ? Choose water, low-fat milk, unsweetened iced tea, or other drinks without added sugar. If you want an alcoholic beverage, choose a lower-calorie option, such as a glass of wine or light beer. ? Ask for dressings, sauces, and syrups on the side. These are usually high in calories, so you should limit the amount you eat. ? If you want a salad, choose a garden salad and ask for grilled meats. Avoid extra toppings such as bacon, cheese, or fried items. Ask for the dressing on the side, or ask for olive oil and vinegar or lemon to use as dressing.  Estimate how many servings of a food you are given. Knowing serving sizes will help you be aware of how much food you are eating at restaurants. Where to find more information  Centers for Disease Control and Prevention: www.cdc.gov  [place] Department of Agriculture: myplate.gov Summary  Calorie counting means keeping track of how many calories you eat and drink each day. If you eat fewer calories than your body needs, you should lose weight.  A healthy amount of weight to lose per week is usually 1-2 lb (0.5-0.9 kg). This usually means reducing your daily calorie intake by 500-750 calories.  The number of calories in a food can be found on a Nutrition Facts label. If a food does not have a Nutrition Facts label, try to look up the calories online or ask your dietitian for help.  Use smaller plates, glasses, and bowls for smaller portions and to prevent overeating.  Use your calories on foods and drinks that will fill you up and not leave you hungry shortly after a meal. This information is not intended to replace advice given to you by your health care provider. Make sure you discuss any questions you have with your health care provider. Document Revised: 03/19/2019 Document Reviewed: 03/19/2019 Elsevier Patient Education  2021 Elsevier Inc.  

## 2020-06-29 NOTE — Progress Notes (Signed)
Discuss BP medication giving him headaches.

## 2020-06-30 ENCOUNTER — Other Ambulatory Visit: Payer: Self-pay

## 2020-06-30 ENCOUNTER — Other Ambulatory Visit: Payer: Self-pay | Admitting: Pharmacist

## 2020-06-30 DIAGNOSIS — E1169 Type 2 diabetes mellitus with other specified complication: Secondary | ICD-10-CM

## 2020-06-30 MED ORDER — TRULICITY 3 MG/0.5ML ~~LOC~~ SOAJ: 3.0000 mg | SUBCUTANEOUS | 6 refills | Status: DC

## 2020-07-01 ENCOUNTER — Other Ambulatory Visit: Payer: Self-pay

## 2020-07-04 ENCOUNTER — Other Ambulatory Visit: Payer: Self-pay

## 2020-07-04 ENCOUNTER — Ambulatory Visit: Payer: Self-pay | Attending: Family Medicine

## 2020-07-04 DIAGNOSIS — Z794 Long term (current) use of insulin: Secondary | ICD-10-CM

## 2020-07-04 DIAGNOSIS — E1169 Type 2 diabetes mellitus with other specified complication: Secondary | ICD-10-CM

## 2020-07-05 LAB — CMP14+EGFR
ALT: 22 IU/L (ref 0–44)
AST: 22 IU/L (ref 0–40)
Albumin/Globulin Ratio: 1.2 (ref 1.2–2.2)
Albumin: 4.4 g/dL (ref 4.0–5.0)
Alkaline Phosphatase: 114 IU/L (ref 44–121)
BUN/Creatinine Ratio: 14 (ref 9–20)
BUN: 22 mg/dL — ABNORMAL HIGH (ref 6–20)
Bilirubin Total: 0.2 mg/dL (ref 0.0–1.2)
CO2: 23 mmol/L (ref 20–29)
Calcium: 9.7 mg/dL (ref 8.7–10.2)
Chloride: 104 mmol/L (ref 96–106)
Creatinine, Ser: 1.56 mg/dL — ABNORMAL HIGH (ref 0.76–1.27)
Globulin, Total: 3.6 g/dL (ref 1.5–4.5)
Glucose: 70 mg/dL (ref 65–99)
Potassium: 4.4 mmol/L (ref 3.5–5.2)
Sodium: 142 mmol/L (ref 134–144)
Total Protein: 8 g/dL (ref 6.0–8.5)
eGFR: 59 mL/min/{1.73_m2} — ABNORMAL LOW (ref >59)

## 2020-07-05 LAB — LIPID PANEL
Chol/HDL Ratio: 5.9 ratio — ABNORMAL HIGH (ref 0.0–5.0)
Cholesterol, Total: 231 mg/dL — ABNORMAL HIGH (ref 100–199)
HDL: 39 mg/dL — ABNORMAL LOW (ref >39)
LDL Chol Calc (NIH): 157 mg/dL — ABNORMAL HIGH (ref 0–99)
Triglycerides: 192 mg/dL — ABNORMAL HIGH (ref 0–149)
VLDL Cholesterol Cal: 35 mg/dL (ref 5–40)

## 2020-07-05 LAB — MICROALBUMIN / CREATININE URINE RATIO
Creatinine, Urine: 231 mg/dL
Microalb/Creat Ratio: 429 mg/g creat — ABNORMAL HIGH (ref 0–29)
Microalbumin, Urine: 990.2 ug/mL

## 2020-07-07 ENCOUNTER — Other Ambulatory Visit: Payer: Self-pay

## 2020-07-07 ENCOUNTER — Other Ambulatory Visit: Payer: Self-pay | Admitting: Family Medicine

## 2020-07-07 MED ORDER — ATORVASTATIN CALCIUM 20 MG PO TABS
20.0000 mg | ORAL_TABLET | Freq: Every day | ORAL | 3 refills | Status: DC
  Filled 2020-07-07: qty 30, 30d supply, fill #0

## 2020-07-08 ENCOUNTER — Other Ambulatory Visit: Payer: Self-pay

## 2020-07-15 ENCOUNTER — Other Ambulatory Visit: Payer: Self-pay

## 2020-08-01 ENCOUNTER — Other Ambulatory Visit: Payer: Self-pay

## 2020-08-18 ENCOUNTER — Other Ambulatory Visit: Payer: Self-pay

## 2020-08-23 ENCOUNTER — Other Ambulatory Visit: Payer: Self-pay

## 2020-10-03 ENCOUNTER — Encounter: Payer: Self-pay | Admitting: Family Medicine

## 2020-10-03 ENCOUNTER — Other Ambulatory Visit: Payer: Self-pay

## 2020-10-03 ENCOUNTER — Ambulatory Visit: Payer: Self-pay | Attending: Family Medicine | Admitting: Family Medicine

## 2020-10-03 VITALS — BP 137/81 | HR 110 | Ht 73.0 in | Wt 280.0 lb

## 2020-10-03 DIAGNOSIS — E785 Hyperlipidemia, unspecified: Secondary | ICD-10-CM

## 2020-10-03 DIAGNOSIS — E1169 Type 2 diabetes mellitus with other specified complication: Secondary | ICD-10-CM

## 2020-10-03 DIAGNOSIS — H00015 Hordeolum externum left lower eyelid: Secondary | ICD-10-CM

## 2020-10-03 DIAGNOSIS — Z794 Long term (current) use of insulin: Secondary | ICD-10-CM

## 2020-10-03 DIAGNOSIS — I10 Essential (primary) hypertension: Secondary | ICD-10-CM

## 2020-10-03 LAB — POCT GLYCOSYLATED HEMOGLOBIN (HGB A1C): HbA1c, POC (controlled diabetic range): 8.1 % — AB (ref 0.0–7.0)

## 2020-10-03 LAB — GLUCOSE, POCT (MANUAL RESULT ENTRY): POC Glucose: 222 mg/dl — AB (ref 70–99)

## 2020-10-03 MED ORDER — ATORVASTATIN CALCIUM 20 MG PO TABS
20.0000 mg | ORAL_TABLET | Freq: Every day | ORAL | 3 refills | Status: DC
  Filled 2020-10-03: qty 30, 30d supply, fill #0
  Filled 2020-11-02: qty 30, 30d supply, fill #1
  Filled 2020-12-05: qty 30, 30d supply, fill #2
  Filled 2021-01-09: qty 30, 30d supply, fill #3

## 2020-10-03 MED ORDER — AMLODIPINE BESYLATE 10 MG PO TABS
10.0000 mg | ORAL_TABLET | Freq: Every day | ORAL | 0 refills | Status: DC
  Filled 2020-10-03: qty 30, 30d supply, fill #0
  Filled 2020-11-02: qty 30, 30d supply, fill #1
  Filled 2020-11-30 – 2020-12-05 (×2): qty 30, 30d supply, fill #2

## 2020-10-03 MED ORDER — LISINOPRIL-HYDROCHLOROTHIAZIDE 20-25 MG PO TABS
1.0000 | ORAL_TABLET | Freq: Every day | ORAL | 0 refills | Status: DC
  Filled 2020-10-03: qty 30, 30d supply, fill #0
  Filled 2020-11-02: qty 30, 30d supply, fill #1
  Filled 2020-12-05: qty 30, 30d supply, fill #2

## 2020-10-03 MED ORDER — DAPAGLIFLOZIN PROPANEDIOL 10 MG PO TABS
10.0000 mg | ORAL_TABLET | Freq: Every day | ORAL | 6 refills | Status: DC
  Filled 2020-10-03: qty 30, 30d supply, fill #0
  Filled 2020-11-01: qty 30, 30d supply, fill #1
  Filled 2020-12-05: qty 30, 30d supply, fill #2
  Filled 2021-01-09: qty 30, 30d supply, fill #3
  Filled 2021-02-08: qty 30, 30d supply, fill #4
  Filled 2021-03-07: qty 30, 30d supply, fill #0
  Filled 2021-04-24: qty 30, 30d supply, fill #1

## 2020-10-03 NOTE — Progress Notes (Signed)
Stye on left eye.

## 2020-10-03 NOTE — Patient Instructions (Signed)
Stye A stye, also known as a hordeolum, is a bump that forms on an eyelid. It may look like a pimple next to the eyelash. A stye can form inside the eyelid (internal stye) or outside the eyelid (external stye). A stye can cause redness, swelling, and pain on the eyelid. Styes are very common. Anyone can get them at any age. They usually occur injust one eye, but you may have more than one in either eye. What are the causes? A stye is caused by an infection. The infection is almost always caused by bacteria called Staphylococcus aureus.This is a common type of bacteria that lives on the skin. An internal stye may result from an infected oil-producing gland inside the eyelid. An external stye may be caused by an infection at the base of the eyelash (hair follicle). What increases the risk? You are more likely to develop a stye if: You have had a stye before. You have any of these conditions: Diabetes. Red, itchy, inflamed eyelids (blepharitis). A skin condition such as seborrheic dermatitis or rosacea. High fat levels in your blood (lipids). What are the signs or symptoms? The most common symptom of a stye is eyelid pain. Internal styes are more painful than external styes. Other symptoms may include: Painful swelling of your eyelid. A scratchy feeling in your eye. Tearing and redness of your eye. Pus draining from the stye. How is this diagnosed? Your health care provider may be able to diagnose a stye just by examining your eye. The health care provider may also check to make sure: You do not have a fever or other signs of a more serious infection. The infection has not spread to other parts of your eye or areas around your eye. How is this treated? Most styes will clear up in a few days without treatment or with warm compresses applied to the area. You may need to use antibiotic drops orointment to treat an infection. In some cases, if your stye does not heal with routine treatment, your  health care provider may drain pus from the stye using a thin blade or needle. This may be done if the stye is large, causing a lot of pain, or affecting yourvision. Follow these instructions at home: Take over-the-counter and prescription medicines only as told by your health care provider. This includes eye drops or ointments. If you were prescribed an antibiotic medicine, apply or use it as told by your health care provider. Do not stop using the antibiotic even if your condition improves. Apply a warm, wet cloth (warm compress) to your eye for 5-10 minutes, 4 times a day. Clean the affected eyelid as directed by your health care provider. Do not wear contact lenses or eye makeup until your stye has healed. Do not try to pop or drain the stye. Do not rub your eye. Contact a health care provider if: You have chills or a fever. Your stye does not go away after several days. Your stye affects your vision. Your eyeball becomes swollen, red, or painful. Get help right away if: You have pain when moving your eye around. Summary A stye is a bump that forms on an eyelid. It may look like a pimple next to the eyelash. A stye can form inside the eyelid (internal stye) or outside the eyelid (external stye). A stye can cause redness, swelling, and pain on the eyelid. Your health care provider may be able to diagnose a stye just by examining your eye. Apply a warm,   wet cloth (warm compress) to your eye for 5-10 minutes, 4 times a day. This information is not intended to replace advice given to you by your health care provider. Make sure you discuss any questions you have with your healthcare provider. Document Revised: 08/26/2019 Document Reviewed: 10/15/2019 Elsevier Patient Education  2022 [employer]vier Inc.  

## 2020-10-03 NOTE — Progress Notes (Signed)
Subjective:  Patient ID: Jerry Arnold, male    DOB: 1985/10/29  Age: 35 y.o. MRN: [id number]  CC: Diabetes   HPI Francisca Harbuck is a 35 y.o. year old male with a history of Type 2 DM (A1c 8.1), HTN, hyperlipidemia, status post right second toe amputation in 03/2017 seen for follow-up visit    Interval History: A1c is 8.1 same as at his last visit. Fasting sugars have been in the 190s. I had increased his Trulicity to 3.0mg  but apparently he never started it but has been under 1.5 mg. He is not sure if he has been taking his Statin.  Last lipid panel revealed elevated cholesterol.  He has had a style in his L eye bottom eyelid for the last 5 months. He had his catarct surgery in 04/2020 and was informed by Ophthalmology to apply heat to the stye which he did but it has not resolved. He has no pain or drainage from the stye and has no abnormal vision. Compliant with his antihypertensive.  Past Medical History:  Diagnosis Date   Hypertension    Osteomyelitis (HCC) 04/11/2017   with "second toe on right foot" injury x 2-3 weeks (04/11/2017)   Type II diabetes mellitus (HCC)     Past Surgical History:  Procedure Laterality Date   AMPUTATION Right 04/13/2017   Procedure: SECOND FOOT RAY AMPUTATION;  Surgeon: Nadara Mustard, MD;  Location: [facility] OR;  Service: Orthopedics;  Laterality: Right;   NO PAST SURGERIES      Family History  Problem Relation Age of Onset   Hypertension Mother    Diabetes Mother    Hypertension Maternal Grandmother    Hypertension Paternal Grandmother     No Known Allergies  Outpatient Medications Prior to Visit  Medication Sig Dispense Refill   acetaminophen (TYLENOL) 500 MG tablet Take 1 tablet (500 mg total) by mouth every 6 (six) hours as needed. 30 tablet 0   Dulaglutide (TRULICITY) 3 MG/0.5ML SOPN Inject 3 mg as directed once a week. 2 mL 6   insulin NPH-regular Human (NOVOLIN 70/30) (70-30) 100 UNIT/ML injection INJECT 30 UNITS INTO THE SKIN WITH FOOD  IN THE MORNING AND THEN INJECT 26 UNITS WITH DINNER 16.8 mL 11   Insulin Pen Needle (TRUEPLUS PEN NEEDLES) 32G X 4 MM MISC Use as directed to inject Trulicity 100 each 11   Insulin Syringe-Needle U-100 (TRUEPLUS INSULIN SYRINGE) 31G X 5/16" 0.3 ML MISC Use as directed to administer insulin twice daily 100 each 3   amLODipine (NORVASC) 10 MG tablet Take 1 tablet (10 mg total) by mouth daily. 90 tablet 0   lisinopril-hydrochlorothiazide (ZESTORETIC) 20-25 MG tablet TAKE 1 TABLET BY MOUTH DAILY. 90 tablet 0   acyclovir (ZOVIRAX) 400 MG tablet Take 1 tablet (400 mg total) by mouth 2 (two) times daily. Prn outbreak (Patient not taking: No sig reported) 50 tablet 1   atorvastatin (LIPITOR) 20 MG tablet Take 1 tablet (20 mg total) by mouth daily. (Patient not taking: Reported on 10/03/2020) 30 tablet 3   insulin NPH-regular Human (70-30) 100 UNIT/ML injection 30 units with food in the morning and 26 units with dinner 30 mL 4   No facility-administered medications prior to visit.     ROS Review of Systems  Constitutional:  Negative for activity change and appetite change.  HENT:  Negative for sinus pressure and sore throat.   Eyes:  Negative for visual disturbance.  Respiratory:  Negative for cough, chest tightness and shortness of breath.  Cardiovascular:  Negative for chest pain and leg swelling.  Gastrointestinal:  Negative for abdominal distention, abdominal pain, constipation and diarrhea.  Endocrine: Negative.   Genitourinary:  Negative for dysuria.  Musculoskeletal:  Negative for joint swelling and myalgias.  Skin:  Negative for rash.  Allergic/Immunologic: Negative.   Neurological:  Negative for weakness, light-headedness and numbness.  Psychiatric/Behavioral:  Negative for dysphoric mood and suicidal ideas.    Objective:  BP 137/81   Pulse (!) 110   Ht 6\' 1"  (1.854 m)   Wt 280 lb (127 kg)   SpO2 98%   BMI 36.94 kg/m   BP/Weight 10/03/2020 06/29/2020 01/05/2020  Systolic BP 137  01/07/2020 144  Diastolic BP 81 90 92  Wt. (Lbs) 280 278 282  BMI 36.94 36.68 37.21      Physical Exam Constitutional:      Appearance: He is well-developed.  Eyes:     Comments: Stye of left lower lid with no discharge or tenderness.  Cardiovascular:     Rate and Rhythm: Tachycardia present.     Heart sounds: Normal heart sounds. No murmur heard. Pulmonary:     Effort: Pulmonary effort is normal.     Breath sounds: Normal breath sounds. No wheezing or rales.  Chest:     Chest wall: No tenderness.  Abdominal:     General: Bowel sounds are normal. There is no distension.     Palpations: Abdomen is soft. There is no mass.     Tenderness: There is no abdominal tenderness.  Musculoskeletal:        General: Normal range of motion.     Right lower leg: No edema.     Left lower leg: No edema.  Neurological:     Mental Status: He is alert and oriented to person, place, and time.  Psychiatric:        Mood and Affect: Mood normal.    CMP Latest Ref Rng & Units 07/04/2020 01/05/2020 05/13/2019  Glucose 65 - 99 mg/dL 70 05/15/2019) 342(A)  BUN 6 - 20 mg/dL 768(T) 15(B) 26(O)  Creatinine 0.76 - 1.27 mg/dL 03(T) 5.97(C) 1.63(A)  Sodium 134 - 144 mmol/L 142 139 139  Potassium 3.5 - 5.2 mmol/L 4.4 4.5 4.3  Chloride 96 - 106 mmol/L 104 101 101  CO2 20 - 29 mmol/L 23 25 23   Calcium 8.7 - 10.2 mg/dL 9.7 9.7 4.53(M  Total Protein 6.0 - 8.5 g/dL 8.0 8.1 -  Total Bilirubin 0.0 - 1.2 mg/dL 0.2 0.2 -  Alkaline Phos 44 - 121 IU/L 114 118 -  AST 0 - 40 IU/L 22 15 -  ALT 0 - 44 IU/L 22 24 -    Lipid Panel     Component Value Date/Time   CHOL 231 (H) 07/04/2020 1108   TRIG 192 (H) 07/04/2020 1108   HDL 39 (L) 07/04/2020 1108   CHOLHDL 5.9 (H) 07/04/2020 1108   CHOLHDL 3.8 04/12/2017 0036   VLDL 18 04/12/2017 0036   LDLCALC 157 (H) 07/04/2020 1108    CBC    Component Value Date/Time   WBC 4.7 02/05/2019 1029   WBC 4.8 09/14/2018 0414   RBC 4.78 02/05/2019 1029   RBC 4.17 (L) 09/14/2018 0414    HGB 13.6 02/05/2019 1029   HCT 41.2 02/05/2019 1029   PLT 242 02/05/2019 1029   MCV 86 02/05/2019 1029   MCH 28.5 02/05/2019 1029   MCH 27.8 09/14/2018 0414   MCHC 33.0 02/05/2019 1029   MCHC 32.0 09/14/2018 0414  RDW 14.2 02/05/2019 1029   LYMPHSABS 2.2 02/05/2019 1029   MONOABS 0.4 09/11/2018 2122   EOSABS 0.1 02/05/2019 1029   BASOSABS 0.1 02/05/2019 1029    Lab Results  Component Value Date   HGBA1C 8.1 (A) 10/03/2020    Assessment & Plan:  1. Type 2 diabetes mellitus with other specified complication, with long-term current use of insulin (HCC) Uncontrolled with A1c of 8.1; goal is less than 7.0 He never started the new dose of 3.0 mg of Trulicity but has been on 1.5 mg He will commence the new dose this week Farxiga added to regimen Counseled on Diabetic diet, my plate method, 979 minutes of moderate intensity exercise/week Blood sugar logs with fasting goals of 80-120 mg/dl, random of less than 892 and in the event of sugars less than 60 mg/dl or greater than 119 mg/dl encouraged to notify the clinic. Advised on the need for annual eye exams, annual foot exams, Pneumonia vaccine. - POCT glucose (manual entry) - POCT glycosylated hemoglobin (Hb A1C) - dapagliflozin propanediol (FARXIGA) 10 MG TABS tablet; Take 1 tablet (10 mg total) by mouth daily before breakfast.  Dispense: 30 tablet; Refill: 6  2. Essential hypertension Controlled Counseled on blood pressure goal of less than 130/80, low-sodium, DASH diet, medication compliance, 150 minutes of moderate intensity exercise per week. Discussed medication compliance, adverse effects. - amLODipine (NORVASC) 10 MG tablet; Take 1 tablet (10 mg total) by mouth daily.  Dispense: 90 tablet; Refill: 0 - Basic Metabolic Panel; Future - lisinopril-hydrochlorothiazide (ZESTORETIC) 20-25 MG tablet; TAKE 1 TABLET BY MOUTH DAILY.  Dispense: 90 tablet; Refill: 0  3. 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 Hyperlipidemia associated with type 2 diabetes mellitus (HCC) Uncontrolled He is unsure of his compliance with statin I have restarted his statin. Low-cholesterol diet - atorvastatin (LIPITOR) 20 MG tablet; Take 1 tablet (20 mg total) by mouth daily.  Dispense: 30 tablet; Refill: 3    Meds ordered this encounter  Medications   atorvastatin (LIPITOR) 20 MG tablet    Sig: Take 1 tablet (20 mg total) by mouth daily.    Dispense:  30 tablet    Refill:  3   amLODipine (NORVASC) 10 MG tablet    Sig: Take 1 tablet (10 mg total) by mouth daily.    Dispense:  90 tablet    Refill:  0   dapagliflozin propanediol (FARXIGA) 10 MG TABS tablet    Sig: Take 1 tablet (10 mg total) by mouth daily before breakfast.    Dispense:  30 tablet    Refill:  6   lisinopril-hydrochlorothiazide (ZESTORETIC) 20-25 MG tablet    Sig: TAKE 1 TABLET BY MOUTH DAILY.    Dispense:  90 tablet    Refill:  0     Follow-up: Return in about 3 months (around 01/03/2021) for Medical conditions.       Hoy Register, MD, FAAFP. [facility] [place], [place] [phone]   10/03/2020, 12:51 PM

## 2020-11-02 ENCOUNTER — Other Ambulatory Visit: Payer: Self-pay

## 2020-11-30 ENCOUNTER — Other Ambulatory Visit: Payer: Self-pay

## 2020-12-05 ENCOUNTER — Other Ambulatory Visit: Payer: Self-pay

## 2020-12-08 ENCOUNTER — Other Ambulatory Visit: Payer: Self-pay

## 2020-12-09 ENCOUNTER — Other Ambulatory Visit: Payer: Self-pay

## 2020-12-19 ENCOUNTER — Other Ambulatory Visit: Payer: Self-pay

## 2020-12-19 ENCOUNTER — Other Ambulatory Visit: Payer: Self-pay | Admitting: Family Medicine

## 2020-12-19 DIAGNOSIS — E1169 Type 2 diabetes mellitus with other specified complication: Secondary | ICD-10-CM

## 2020-12-19 MED ORDER — TRULICITY 3 MG/0.5ML ~~LOC~~ SOAJ
3.0000 mg | SUBCUTANEOUS | 3 refills | Status: DC
  Filled 2020-12-19: qty 2, 28d supply, fill #0

## 2020-12-19 NOTE — Telephone Encounter (Signed)
Requested Prescriptions  Pending Prescriptions Disp Refills  . Dulaglutide (TRULICITY) 3 MG/0.5ML SOPN 2 mL 3    Sig: Inject 3 mg as directed once a week.     Endocrinology:  Diabetes - GLP-1 Receptor Agonists Failed - 12/19/2020  9:45 AM      Failed - HBA1C is between 0 and 7.9 and within 180 days    HbA1c, POC (controlled diabetic range)  Date Value Ref Range Status  10/03/2020 8.1 (A) 0.0 - 7.0 % Final         Passed - Valid encounter within last 6 months    Recent Outpatient Visits          2 months ago Type 2 diabetes mellitus with other specified complication, with long-term current use of insulin (HCC)   [facility] [place], [place], MD   5 months ago Type 2 diabetes mellitus with other specified complication, with long-term current use of insulin (HCC)   [facility] [place], Gem, MD   11 months ago Type 2 diabetes mellitus with other specified complication, with long-term current use of insulin (HCC)   [facility] [place], [place], MD   1 year ago Type 2 diabetes mellitus with other specified complication, with long-term current use of insulin (HCC)   [facility] [place], [place], MD   1 year ago Type 2 diabetes mellitus with other specified complication, with long-term current use of insulin (HCC)   [facility] Hoy Register, MD      Future Appointments            In 2 weeks Hoy Register, MD [facility]

## 2020-12-21 ENCOUNTER — Other Ambulatory Visit: Payer: Self-pay

## 2020-12-22 ENCOUNTER — Other Ambulatory Visit: Payer: Self-pay

## 2021-01-03 ENCOUNTER — Ambulatory Visit: Payer: Self-pay | Admitting: Family Medicine

## 2021-01-05 ENCOUNTER — Encounter: Payer: Self-pay | Admitting: Family Medicine

## 2021-01-05 ENCOUNTER — Other Ambulatory Visit: Payer: Self-pay

## 2021-01-05 ENCOUNTER — Ambulatory Visit: Payer: Self-pay | Attending: Family Medicine | Admitting: Family Medicine

## 2021-01-05 VITALS — BP 127/88 | HR 100 | Ht 72.0 in | Wt 275.2 lb

## 2021-01-05 DIAGNOSIS — I152 Hypertension secondary to endocrine disorders: Secondary | ICD-10-CM

## 2021-01-05 DIAGNOSIS — Z794 Long term (current) use of insulin: Secondary | ICD-10-CM

## 2021-01-05 DIAGNOSIS — L84 Corns and callosities: Secondary | ICD-10-CM

## 2021-01-05 DIAGNOSIS — E785 Hyperlipidemia, unspecified: Secondary | ICD-10-CM

## 2021-01-05 DIAGNOSIS — E1169 Type 2 diabetes mellitus with other specified complication: Secondary | ICD-10-CM

## 2021-01-05 DIAGNOSIS — E1159 Type 2 diabetes mellitus with other circulatory complications: Secondary | ICD-10-CM

## 2021-01-05 DIAGNOSIS — Z23 Encounter for immunization: Secondary | ICD-10-CM

## 2021-01-05 LAB — POCT GLYCOSYLATED HEMOGLOBIN (HGB A1C): HbA1c, POC (controlled diabetic range): 8.6 % — AB (ref 0.0–7.0)

## 2021-01-05 LAB — GLUCOSE, POCT (MANUAL RESULT ENTRY): POC Glucose: 180 mg/dl — AB (ref 70–99)

## 2021-01-05 MED ORDER — NOVOLIN 70/30 (70-30) 100 UNIT/ML ~~LOC~~ SUSP
28.0000 [IU] | Freq: Two times a day (BID) | SUBCUTANEOUS | 6 refills | Status: DC
  Filled 2021-01-05: qty 10, 18d supply, fill #0
  Filled 2021-02-08: qty 10, 18d supply, fill #1
  Filled 2021-03-07: qty 10, 18d supply, fill #0
  Filled 2021-04-24: qty 10, 18d supply, fill #1

## 2021-01-05 MED ORDER — LISINOPRIL-HYDROCHLOROTHIAZIDE 20-25 MG PO TABS
1.0000 | ORAL_TABLET | Freq: Every day | ORAL | 1 refills | Status: DC
  Filled 2021-01-05: qty 30, 30d supply, fill #0
  Filled 2021-02-08: qty 30, 30d supply, fill #1
  Filled 2021-03-07: qty 30, 30d supply, fill #0
  Filled 2021-04-24: qty 30, 30d supply, fill #1

## 2021-01-05 MED ORDER — AMLODIPINE BESYLATE 10 MG PO TABS
10.0000 mg | ORAL_TABLET | Freq: Every day | ORAL | 1 refills | Status: DC
  Filled 2021-01-05: qty 30, 30d supply, fill #0
  Filled 2021-02-08: qty 30, 30d supply, fill #1
  Filled 2021-03-07: qty 30, 30d supply, fill #0
  Filled 2021-04-24: qty 30, 30d supply, fill #1

## 2021-01-05 MED ORDER — TRULICITY 3 MG/0.5ML ~~LOC~~ SOAJ
3.0000 mg | SUBCUTANEOUS | 1 refills | Status: DC
  Filled 2021-01-05 – 2021-01-06 (×2): qty 2, 28d supply, fill #0

## 2021-01-05 MED ORDER — TRULICITY 4.5 MG/0.5ML ~~LOC~~ SOAJ
4.5000 mg | SUBCUTANEOUS | 6 refills | Status: DC
  Filled 2021-01-05 – 2021-03-22 (×2): qty 2, 28d supply, fill #0

## 2021-01-05 NOTE — Patient Instructions (Signed)

## 2021-01-05 NOTE — Progress Notes (Signed)
Subjective:  Patient ID: Jerry Arnold, male    DOB: 29-Jan-1986  Age: 35 y.o. MRN: [id number]  CC: Diabetes   HPI Jerry Arnold is a 35 y.o. year old male with a history of Type 2 DM (A1c 8.6), HTN, hyperlipidemia, status post right second toe amputation in 03/2017 seen for follow-up visit  Interval History: His blood sugars are in the 120-140 range fasting.  Trulicity was increased to 3.0 at his last office visit and he endorses compliance.  His A1c is 8.6 up from 8.1 previously.  Denies presence of visual concerns or neuropathy.  Trying his best to adhere to a diabetic diet. He has been compliant with his antihypertensive and his statin. Denies concerns today Past Medical History:  Diagnosis Date   Hypertension    Osteomyelitis ([place]) 04/11/2017   with "second toe on right foot" injury x 2-3 weeks (04/11/2017)   Type II diabetes mellitus ([place])     Past Surgical History:  Procedure Laterality Date   AMPUTATION Right 04/13/2017   Procedure: SECOND FOOT RAY AMPUTATION;  Surgeon: Newt Minion, MD;  Location: [place];  Service: Orthopedics;  Laterality: Right;   NO PAST SURGERIES      Family History  Problem Relation Age of Onset   Hypertension Mother    Diabetes Mother    Hypertension Maternal Grandmother    Hypertension Paternal Grandmother     No Known Allergies  Outpatient Medications Prior to Visit  Medication Sig Dispense Refill   atorvastatin (LIPITOR) 20 MG tablet Take 1 tablet (20 mg total) by mouth daily. 30 tablet 3   dapagliflozin propanediol (FARXIGA) 10 MG TABS tablet Take 1 tablet (10 mg total) by mouth daily before breakfast. 30 tablet 6   Insulin Pen Needle (TRUEPLUS PEN NEEDLES) 32G X 4 MM MISC Use as directed to inject Trulicity 409 each 11   Insulin Syringe-Needle U-100 (TRUEPLUS INSULIN SYRINGE) 31G X 5/16" 0.3 ML MISC Use as directed to administer insulin twice daily 100 each 3   amLODipine (NORVASC) 10 MG tablet Take 1 tablet (10 mg total) by mouth  daily. 90 tablet 0   Dulaglutide (TRULICITY) 3 WJ/1.9JY SOPN Inject 3 mg as directed once a week. 2 mL 3   insulin NPH-regular Human (NOVOLIN 70/30) (70-30) 100 UNIT/ML injection INJECT 30 UNITS INTO THE SKIN WITH FOOD IN THE MORNING AND THEN INJECT 26 UNITS WITH DINNER 16.8 mL 11   lisinopril-hydrochlorothiazide (ZESTORETIC) 20-25 MG tablet TAKE 1 TABLET BY MOUTH DAILY. 90 tablet 0   acetaminophen (TYLENOL) 500 MG tablet Take 1 tablet (500 mg total) by mouth every 6 (six) hours as needed. (Patient not taking: Reported on 01/05/2021) 30 tablet 0   No facility-administered medications prior to visit.     ROS Review of Systems  Constitutional:  Negative for activity change and appetite change.  HENT:  Negative for sinus pressure and sore throat.   Eyes:  Negative for visual disturbance.  Respiratory:  Negative for cough, chest tightness and shortness of breath.   Cardiovascular:  Negative for chest pain and leg swelling.  Gastrointestinal:  Negative for abdominal distention, abdominal pain, constipation and diarrhea.  Endocrine: Negative.   Genitourinary:  Negative for dysuria.  Musculoskeletal:  Negative for joint swelling and myalgias.  Skin:  Negative for rash.  Allergic/Immunologic: Negative.   Neurological:  Negative for weakness, light-headedness and numbness.  Psychiatric/Behavioral:  Negative for dysphoric mood and suicidal ideas.    Objective:  BP 127/88   Pulse 100  Ht 6' (1.829 m)   Wt 275 lb 3.2 oz (124.8 kg)   SpO2 98%   BMI 37.32 kg/m   BP/Weight 01/05/2021 10/03/2020 0/25/4270  Systolic BP 623 762 831  Diastolic BP 88 81 90  Wt. (Lbs) 275.2 280 278  BMI 37.32 36.94 36.68      Physical Exam Constitutional:      Appearance: He is well-developed.  Cardiovascular:     Rate and Rhythm: Normal rate.     Heart sounds: Normal heart sounds. No murmur heard. Pulmonary:     Effort: Pulmonary effort is normal.     Breath sounds: Normal breath sounds. No wheezing  or rales.  Chest:     Chest wall: No tenderness.  Abdominal:     General: Bowel sounds are normal. There is no distension.     Palpations: Abdomen is soft. There is no mass.     Tenderness: There is no abdominal tenderness.  Musculoskeletal:        General: Normal range of motion.     Right lower leg: No edema.     Left lower leg: No edema.  Neurological:     Mental Status: He is alert and oriented to person, place, and time.  Psychiatric:        Mood and Affect: Mood normal.     Diabetic Foot Exam - Simple   Simple Foot Form Diabetic Foot exam was performed with the following findings: Yes 01/05/2021 11:02 AM  Visual Inspection See comments: Yes Sensation Testing See comments: Yes Pulse Check Posterior Tibialis and Dorsalis pulse intact bilaterally: Yes Comments Amputation of second toe right foot Callus on plantar aspect of first right MTP joint and fourth right MTP joint Decreased sensation in anterior half of bilateral feet     CMP Latest Ref Rng & Units 07/04/2020 01/05/2020 05/13/2019  Glucose 65 - 99 mg/dL 70 119(H) 134(H)  BUN 6 - 20 mg/dL 22(H) 22(H) 26(H)  Creatinine 0.76 - 1.27 mg/dL 1.56(H) 1.61(H) 1.51(H)  Sodium 134 - 144 mmol/L 142 139 139  Potassium 3.5 - 5.2 mmol/L 4.4 4.5 4.3  Chloride 96 - 106 mmol/L 104 101 101  CO2 20 - 29 mmol/L '23 25 23  ' Calcium 8.7 - 10.2 mg/dL 9.7 9.7 10.0  Total Protein 6.0 - 8.5 g/dL 8.0 8.1 -  Total Bilirubin 0.0 - 1.2 mg/dL 0.2 0.2 -  Alkaline Phos 44 - 121 IU/L 114 118 -  AST 0 - 40 IU/L 22 15 -  ALT 0 - 44 IU/L 22 24 -    Lipid Panel     Component Value Date/Time   CHOL 231 (H) 07/04/2020 1108   TRIG 192 (H) 07/04/2020 1108   HDL 39 (L) 07/04/2020 1108   CHOLHDL 5.9 (H) 07/04/2020 1108   CHOLHDL 3.8 04/12/2017 0036   VLDL 18 04/12/2017 0036   LDLCALC 157 (H) 07/04/2020 1108    CBC    Component Value Date/Time   WBC 4.7 02/05/2019 1029   WBC 4.8 09/14/2018 0414   RBC 4.78 02/05/2019 1029   RBC 4.17 (L)  09/14/2018 0414   HGB 13.6 02/05/2019 1029   HCT 41.2 02/05/2019 1029   PLT 242 02/05/2019 1029   MCV 86 02/05/2019 1029   MCH 28.5 02/05/2019 1029   MCH 27.8 09/14/2018 0414   MCHC 33.0 02/05/2019 1029   MCHC 32.0 09/14/2018 0414   RDW 14.2 02/05/2019 1029   LYMPHSABS 2.2 02/05/2019 1029   MONOABS 0.4 09/11/2018 2122   EOSABS  0.1 02/05/2019 1029   BASOSABS 0.1 02/05/2019 1029    Lab Results  Component Value Date   HGBA1C 8.6 (A) 01/05/2021    Assessment & Plan:  1. Type 2 diabetes mellitus with other specified complication, with long-term current use of insulin (HCC) Uncontrolled with A1c of 8.6 which has trended up from 8.1 previously Increased from 3 mg of Trulicity to 4.5 mg After sending prescription to the pharmacy was informed his Trulicity 4.5 mg is on backorder so he will continue with 3 mg until 4.5 mg available meanwhile NovoLog 70/30 has been increased from 28 units twice daily to 30 units twice daily Counseled on Diabetic diet, my plate method, 184 minutes of moderate intensity exercise/week Blood sugar logs with fasting goals of 80-120 mg/dl, random of less than 180 and in the event of sugars less than 60 mg/dl or greater than 400 mg/dl encouraged to notify the clinic. Advised on the need for annual eye exams, annual foot exams, Pneumonia vaccine. - insulin NPH-regular Human (NOVOLIN 70/30) (70-30) 100 UNIT/ML injection; Inject 28 Units into the skin 2 (two) times daily with a meal.  Dispense: 30 mL; Refill: 6 - Dulaglutide (TRULICITY) 4.5 CR/7.5OH SOPN; Inject 4.5 mg as directed once a week.  Dispense: 2 mL; Refill: 6 - Microalbumin / creatinine urine ratio - LP+Non-HDL Cholesterol - CMP14+EGFR - Ambulatory referral to Podiatry - POCT glucose (manual entry) - POCT glycosylated hemoglobin (Hb A1C) - Dulaglutide (TRULICITY) 3 KG/6.7PC SOPN; Inject 3 mg as directed once a week. Until 4.5 mg is available at the pharmacy then discontinue 3 mg  Dispense: 2 mL; Refill:  1  2. Hypertension associated with diabetes ([place]) Controlled Counseled on blood pressure goal of less than 130/80, low-sodium, DASH diet, medication compliance, 150 minutes of moderate intensity exercise per week. Discussed medication compliance, adverse effects. - amLODipine (NORVASC) 10 MG tablet; Take 1 tablet (10 mg total) by mouth daily.  Dispense: 90 tablet; Refill: 1 - lisinopril-hydrochlorothiazide (ZESTORETIC) 20-25 MG tablet; TAKE 1 TABLET BY MOUTH DAILY.  Dispense: 90 tablet; Refill: 1  3. Callus of foot - Ambulatory referral to Podiatry  4. Hyperlipidemia associated with type 2 diabetes mellitus ([place]) Uncontrolled Will check lipid panel and adjust regimen accordingly Continue statin Low-cholesterol diet  5. Need for immunization against influenza - Flu Vaccine QUAD 68moIM (Fluarix, Fluzone & Alfiuria Quad PF)    Meds ordered this encounter  Medications   insulin NPH-regular Human (NOVOLIN 70/30) (70-30) 100 UNIT/ML injection    Sig: Inject 28 Units into the skin 2 (two) times daily with a meal.    Dispense:  30 mL    Refill:  6   Dulaglutide (TRULICITY) 4.5 MHE/0.3TCSOPN    Sig: Inject 4.5 mg as directed once a week.    Dispense:  2 mL    Refill:  6    Dose increase   amLODipine (NORVASC) 10 MG tablet    Sig: Take 1 tablet (10 mg total) by mouth daily.    Dispense:  90 tablet    Refill:  1   lisinopril-hydrochlorothiazide (ZESTORETIC) 20-25 MG tablet    Sig: TAKE 1 TABLET BY MOUTH DAILY.    Dispense:  90 tablet    Refill:  1   Dulaglutide (TRULICITY) 3 MYE/1.8HTSOPN    Sig: Inject 3 mg as directed once a week. Until 4.5 mg is available at the pharmacy then discontinue 3 mg    Dispense:  2 mL    Refill:  1  Dose increase     Follow-up: Return in about 3 months (around 04/07/2021) for Chronic medical conditions.       Charlott Rakes, MD, FAAFP. [facility] [place], [place]   01/05/2021, 12:45 PM

## 2021-01-06 ENCOUNTER — Other Ambulatory Visit: Payer: Self-pay

## 2021-01-06 LAB — CMP14+EGFR
ALT: 22 IU/L (ref 0–44)
AST: 20 IU/L (ref 0–40)
Albumin/Globulin Ratio: 1.4 (ref 1.2–2.2)
Albumin: 4.7 g/dL (ref 4.0–5.0)
Alkaline Phosphatase: 129 IU/L — ABNORMAL HIGH (ref 44–121)
BUN/Creatinine Ratio: 19 (ref 9–20)
BUN: 34 mg/dL — ABNORMAL HIGH (ref 6–20)
Bilirubin Total: 0.2 mg/dL (ref 0.0–1.2)
CO2: 22 mmol/L (ref 20–29)
Calcium: 9.4 mg/dL (ref 8.7–10.2)
Chloride: 103 mmol/L (ref 96–106)
Creatinine, Ser: 1.79 mg/dL — ABNORMAL HIGH (ref 0.76–1.27)
Globulin, Total: 3.4 g/dL (ref 1.5–4.5)
Glucose: 92 mg/dL (ref 70–99)
Potassium: 4.9 mmol/L (ref 3.5–5.2)
Sodium: 141 mmol/L (ref 134–144)
Total Protein: 8.1 g/dL (ref 6.0–8.5)
eGFR: 50 mL/min/{1.73_m2} — ABNORMAL LOW (ref >59)

## 2021-01-06 LAB — LP+NON-HDL CHOLESTEROL
Cholesterol, Total: 134 mg/dL (ref 100–199)
HDL: 37 mg/dL — ABNORMAL LOW (ref >39)
LDL Chol Calc (NIH): 77 mg/dL (ref 0–99)
Total Non-HDL-Chol (LDL+VLDL): 97 mg/dL (ref 0–129)
Triglycerides: 110 mg/dL (ref 0–149)
VLDL Cholesterol Cal: 20 mg/dL (ref 5–40)

## 2021-01-06 LAB — MICROALBUMIN / CREATININE URINE RATIO
Creatinine, Urine: 79.2 mg/dL
Microalb/Creat Ratio: 96 mg/g creat — ABNORMAL HIGH (ref 0–29)
Microalbumin, Urine: 76.1 ug/mL

## 2021-01-09 ENCOUNTER — Other Ambulatory Visit: Payer: Self-pay

## 2021-01-10 ENCOUNTER — Other Ambulatory Visit: Payer: Self-pay

## 2021-01-10 ENCOUNTER — Ambulatory Visit: Payer: Self-pay | Admitting: Podiatry

## 2021-02-08 ENCOUNTER — Other Ambulatory Visit: Payer: Self-pay

## 2021-02-08 ENCOUNTER — Other Ambulatory Visit: Payer: Self-pay | Admitting: Family Medicine

## 2021-02-08 DIAGNOSIS — E1169 Type 2 diabetes mellitus with other specified complication: Secondary | ICD-10-CM

## 2021-02-08 MED ORDER — ATORVASTATIN CALCIUM 20 MG PO TABS
20.0000 mg | ORAL_TABLET | Freq: Every day | ORAL | 2 refills | Status: DC
  Filled 2021-02-08 – 2021-03-07 (×2): qty 30, 30d supply, fill #0
  Filled 2021-04-24: qty 30, 30d supply, fill #1

## 2021-02-10 ENCOUNTER — Other Ambulatory Visit: Payer: Self-pay

## 2021-02-27 ENCOUNTER — Other Ambulatory Visit: Payer: Self-pay

## 2021-02-27 ENCOUNTER — Encounter (HOSPITAL_COMMUNITY): Payer: Self-pay

## 2021-02-27 ENCOUNTER — Emergency Department (HOSPITAL_COMMUNITY): Payer: Medicaid - Out of State

## 2021-02-27 ENCOUNTER — Emergency Department (HOSPITAL_COMMUNITY)
Admission: EM | Admit: 2021-02-27 | Discharge: 2021-02-28 | Disposition: A | Discharge status: Home / Self Care | Payer: Medicaid - Out of State | Attending: Emergency Medicine | Admitting: Emergency Medicine

## 2021-02-27 DIAGNOSIS — E11628 Type 2 diabetes mellitus with other skin complications: Secondary | ICD-10-CM

## 2021-02-27 DIAGNOSIS — L97519 Non-pressure chronic ulcer of other part of right foot with unspecified severity: Secondary | ICD-10-CM | POA: Diagnosis not present

## 2021-02-27 DIAGNOSIS — Z794 Long term (current) use of insulin: Secondary | ICD-10-CM | POA: Insufficient documentation

## 2021-02-27 DIAGNOSIS — Z79899 Other long term (current) drug therapy: Secondary | ICD-10-CM | POA: Diagnosis not present

## 2021-02-27 DIAGNOSIS — M25571 Pain in right ankle and joints of right foot: Secondary | ICD-10-CM | POA: Diagnosis present

## 2021-02-27 DIAGNOSIS — E11621 Type 2 diabetes mellitus with foot ulcer: Secondary | ICD-10-CM | POA: Diagnosis not present

## 2021-02-27 LAB — CBC WITH DIFFERENTIAL/PLATELET
Abs Immature Granulocytes: 0.02 10*3/uL (ref 0.00–0.07)
Basophils Absolute: 0.1 10*3/uL (ref 0.0–0.1)
Basophils Relative: 1 %
Eosinophils Absolute: 0.2 10*3/uL (ref 0.0–0.5)
Eosinophils Relative: 3 %
HCT: 46.6 % (ref 39.0–52.0)
Hemoglobin: 14.9 g/dL (ref 13.0–17.0)
Immature Granulocytes: 0 %
Lymphocytes Relative: 32 %
Lymphs Abs: 2 10*3/uL (ref 0.7–4.0)
MCH: 28.1 pg (ref 26.0–34.0)
MCHC: 32 g/dL (ref 30.0–36.0)
MCV: 87.9 fL (ref 80.0–100.0)
Monocytes Absolute: 0.9 10*3/uL (ref 0.1–1.0)
Monocytes Relative: 14 %
Neutro Abs: 3.2 10*3/uL (ref 1.7–7.7)
Neutrophils Relative %: 50 %
Platelets: 261 10*3/uL (ref 150–400)
RBC: 5.3 MIL/uL (ref 4.22–5.81)
RDW: 14.2 % (ref 11.5–15.5)
WBC: 6.4 10*3/uL (ref 4.0–10.5)
nRBC: 0 % (ref 0.0–0.2)

## 2021-02-27 LAB — BASIC METABOLIC PANEL
Anion gap: 6 (ref 5–15)
BUN: 35 mg/dL — ABNORMAL HIGH (ref 6–20)
CO2: 23 mmol/L (ref 22–32)
Calcium: 8.9 mg/dL (ref 8.9–10.3)
Chloride: 106 mmol/L (ref 98–111)
Creatinine, Ser: 2.02 mg/dL — ABNORMAL HIGH (ref 0.61–1.24)
GFR, Estimated: 43 mL/min — ABNORMAL LOW (ref >60)
Glucose, Bld: 155 mg/dL — ABNORMAL HIGH (ref 70–99)
Potassium: 5 mmol/L (ref 3.5–5.1)
Sodium: 135 mmol/L (ref 135–145)

## 2021-02-27 LAB — LACTIC ACID, PLASMA: Lactic Acid, Venous: 1.1 mmol/L (ref 0.5–1.9)

## 2021-02-27 NOTE — ED Triage Notes (Signed)
Pt reports with right big toe injury after filing it too low x 1 week ago. (Pt was trying to shave off dead skin). Pt reports having diabetes.

## 2021-02-27 NOTE — ED Provider Triage Note (Signed)
Emergency Medicine Provider Triage Evaluation Note  Jerry Arnold , a 36 y.o. male  was evaluated in triage.  Pt complains of injury to right great toe after filing last week.  Patient reports there was some excess skin when she was trying to file down and instead injured himself.  He denies fever, chills.  Similar injury to great toe on the left foot that occurred around the same time.  He has diabetic neuropathy.  History of diabetic foot wound requiring amputation of second toe right foot.  Review of Systems  Positive: As above Negative: As above  Physical Exam  BP (!) 133/93 (BP Location: Left Arm)    Pulse 100    Temp 98.3 F (36.8 C) (Oral)    Resp 18    Ht 6' (1.829 m)    Wt 124.7 kg    SpO2 93%    BMI 37.30 kg/m  Gen:   Awake, no distress   Resp:  Normal effort  MSK:   Moves extremities without difficulty Other:  Great toe on bilateral feet with wound.  DP pulse 2+ bilaterally.  Medical Decision Making  Medically screening exam initiated at 10:51 PM.  Appropriate orders placed.  Channin Lennartz was informed that the remainder of the evaluation will be completed by another provider, this initial triage assessment does not replace that evaluation, and the importance of remaining in the ED until their evaluation is complete.     Evlyn Courier, PA-C 02/27/21 2253

## 2021-02-27 NOTE — ED Notes (Signed)
Save blue tube in main lab °

## 2021-02-28 ENCOUNTER — Other Ambulatory Visit: Payer: Self-pay

## 2021-02-28 LAB — LACTIC ACID, PLASMA: Lactic Acid, Venous: 0.9 mmol/L (ref 0.5–1.9)

## 2021-02-28 MED ORDER — CEPHALEXIN 500 MG PO CAPS
500.0000 mg | ORAL_CAPSULE | Freq: Four times a day (QID) | ORAL | 0 refills | Status: AC
  Filled 2021-02-28: qty 28, 7d supply, fill #0

## 2021-02-28 MED ORDER — SODIUM CHLORIDE 0.9 % IV BOLUS
500.0000 mL | Freq: Once | INTRAVENOUS | Status: AC
  Administered 2021-02-28: 500 mL via INTRAVENOUS

## 2021-02-28 MED ORDER — DOXYCYCLINE HYCLATE 100 MG PO TABS
100.0000 mg | ORAL_TABLET | Freq: Two times a day (BID) | ORAL | 0 refills | Status: AC
  Filled 2021-02-28: qty 14, 7d supply, fill #0

## 2021-02-28 NOTE — Discharge Instructions (Addendum)
I have prescribed 2 separate antibiotics in order to treat your infection.  The first antibiotics is called Keflex, you will need to take 1 tablet 4 times a day for the next 7 days.  In addition, I have also prescribed doxycycline.  Please take 1 tablet twice a day for the next 7 days.  You will need to have your renal function rechecked by your primary care physician at the end of the month.  In addition a few begin to experience any fever, worsening pain, worsening symptoms you will need to return to the emergency department immediately.

## 2021-02-28 NOTE — ED Provider Notes (Signed)
[facility]-EMERGENCY DEPT Provider Note   CSN: [id number] Arrival date & time: 02/27/21  2100     History  Chief Complaint  Patient presents with   Toe Injury    Jerry Arnold is a 36 y.o. male.  36 y.o male with a PMH of DM2 presents to the ED with a chief complaint of right great toe injury x1 week.  Patient reports he was cutting this extra skin of his right great toe, when he noted a small wound, this has worsened over the last week.  He does report some active drainage to the area, and has placed on bacitracin without much improvement in symptoms.  Patient is concerned as he has had a prior history of second right toe amputation approximately 4 years ago.  He does report his blood sugars at home have been running within normal limits, he has been taking his medication as prescribed.  There has been no fever, no chills, no other complaints.   The history is provided by the patient and medical records.      Home Medications Prior to Admission medications   Medication Sig Start Date End Date Taking? Authorizing Provider  cephALEXin (KEFLEX) 500 MG capsule Take 1 capsule (500 mg total) by mouth 4 (four) times daily for 7 days. 02/28/21 03/07/21 Yes Keanu Frickey, Leonie Douglas, PA-C  doxycycline (VIBRAMYCIN) 100 MG capsule Take 1 capsule (100 mg total) by mouth 2 (two) times daily for 7 days. 02/28/21 03/07/21 Yes Triana Coover, Leonie Douglas, PA-C  acetaminophen (TYLENOL) 500 MG tablet Take 1 tablet (500 mg total) by mouth every 6 (six) hours as needed. Patient not taking: Reported on 01/05/2021 09/07/18   Fayrene Helper, PA-C  amLODipine (NORVASC) 10 MG tablet Take 1 tablet (10 mg total) by mouth daily. 01/05/21   Hoy Register, MD  atorvastatin (LIPITOR) 20 MG tablet Take 1 tablet (20 mg total) by mouth daily. 02/08/21   Hoy Register, MD  dapagliflozin propanediol (FARXIGA) 10 MG TABS tablet Take 1 tablet (10 mg total) by mouth daily before breakfast. 10/03/20   Hoy Register, MD   Dulaglutide (TRULICITY) 3 MG/0.5ML SOPN Inject 3 mg as directed once a week. Until 4.5 mg is available at the pharmacy then discontinue 3 mg 01/05/21   Hoy Register, MD  Dulaglutide (TRULICITY) 4.5 MG/0.5ML SOPN Inject 4.5 mg as directed once a week. 01/05/21   Hoy Register, MD  insulin NPH-regular Human (NOVOLIN 70/30) (70-30) 100 UNIT/ML injection Inject 28 Units into the skin 2 (two) times daily with a meal. 01/05/21 01/05/22  Hoy Register, MD  Insulin Pen Needle (TRUEPLUS PEN NEEDLES) 32G X 4 MM MISC Use as directed to inject Trulicity 08/12/19   Hoy Register, MD  Insulin Syringe-Needle U-100 (TRUEPLUS INSULIN SYRINGE) 31G X 5/16" 0.3 ML MISC Use as directed to administer insulin twice daily 02/05/19   Georgian Co M, PA-C  lisinopril-hydrochlorothiazide (ZESTORETIC) 20-25 MG tablet TAKE 1 TABLET BY MOUTH DAILY. 01/05/21 01/05/22  Hoy Register, MD  metoCLOPramide (REGLAN) 5 MG tablet Take 1 tablet (5 mg total) by mouth every 6 (six) hours as needed for nausea. Patient not taking: Reported on 11/27/2017 04/24/17 09/07/18  Vivianne Master, PA-C      Allergies    Patient has no known allergies.    Review of Systems   Review of Systems  Constitutional:  Negative for chills and fever.  Respiratory:  Negative for shortness of breath.   Cardiovascular:  Negative for chest pain.  Gastrointestinal:  Negative for anal bleeding.  Genitourinary:  Negative for flank pain.  Musculoskeletal:  Negative for back pain.  Skin:  Positive for wound.  Neurological:  Negative for light-headedness and numbness.  All other systems reviewed and are negative.  Physical Exam Updated Vital Signs BP 113/75    Pulse 95    Temp 98.9 F (37.2 C) (Oral)    Resp 16    Ht 6' (1.829 m)    Wt 124.7 kg    SpO2 98%    BMI 37.30 kg/m  Physical Exam Vitals and nursing note reviewed.  Constitutional:      Appearance: Normal appearance.  HENT:     Head: Normocephalic and atraumatic.     Nose: Nose normal.   Eyes:     Pupils: Pupils are equal, round, and reactive to light.  Cardiovascular:     Rate and Rhythm: Normal rate.     Pulses:          Dorsalis pedis pulses are 2+ on the right side.  Pulmonary:     Effort: Pulmonary effort is normal.  Abdominal:     General: Abdomen is flat.  Musculoskeletal:        General: Tenderness present.     Cervical back: Normal range of motion and neck supple.  Feet:     Right foot:     Skin integrity: Ulcer, erythema, callus and dry skin present. No warmth.     Toenail Condition: Right toenails are abnormally thick.  Skin:    General: Skin is warm and dry.  Neurological:     Mental Status: He is alert and oriented to person, place, and time.       ED Results / Procedures / Treatments   Labs (all labs ordered are listed, but only abnormal results are displayed) Labs Reviewed  BASIC METABOLIC PANEL - Abnormal; Notable for the following components:      Result Value   Glucose, Bld 155 (*)    BUN 35 (*)    Creatinine, Ser 2.02 (*)    GFR, Estimated 43 (*)    All other components within normal limits  AEROBIC CULTURE W GRAM STAIN (SUPERFICIAL SPECIMEN)  CBC WITH DIFFERENTIAL/PLATELET  LACTIC ACID, PLASMA  LACTIC ACID, PLASMA    EKG None  Radiology DG Foot Complete Left  Result Date: 02/27/2021 CLINICAL DATA:  diabetic foot wound EXAM: LEFT FOOT - COMPLETE 3+ VIEW COMPARISON:  None. FINDINGS: No cortical erosion or destruction. No evidence of fracture, dislocation, or joint effusion. No evidence of severe arthropathy. No aggressive appearing focal bone abnormality. Soft tissues are unremarkable. IMPRESSION: Negative. Electronically Signed   By: Tish FredericksonMorgane  Naveau M.D.   On: 02/27/2021 23:26   DG Foot Complete Right  Result Date: 02/27/2021 CLINICAL DATA:  Diabetic foot wound. EXAM: RIGHT FOOT COMPLETE - 3+ VIEW COMPARISON:  Rectal radiograph dated 04/11/2017. FINDINGS: Prior amputation of mid second metatarsal. There is no acute fracture or  dislocation. No periosteal elevation or bone erosion. Mild hallux valgus. There is soft tissue swelling of the great toe with probable skin ulceration of the medial distal great toe. Clinical correlation is recommended. No radiopaque foreign object or soft tissue gas. IMPRESSION: 1. No acute fracture or dislocation. 2. Soft tissue swelling of the great toe with probable skin ulceration. Electronically Signed   By: Elgie CollardArash  Radparvar M.D.   On: 02/27/2021 23:33    Procedures Procedures    Medications Ordered in ED Medications  sodium chloride 0.9 % bolus 500 mL (500 mLs Intravenous New Bag/Given  02/28/21 0902)    ED Course/ Medical Decision Making/ A&P                           Medical Decision Making   Patient with a past medical history of diabetes type 2 presents to the ED for worsening right great toe ulceration that appeared approximately 1 week ago after cutting off dead skin.  He has now systemic signs such as fever, chills, other complaints.  He is concerned as he has a prior history of second right toe amputation proximately 4 years ago.  He has been taking his insulin as prescribed, blood sugars have been running within normal limits at home.  During evaluation today there is a by 1 superficial ulceration at the tip of the right great toe, actively draining with surrounding erythema and some fluctuance present.  DP, PT pulses present, sensation is intact throughout, range of motion.  No pain along the base of the right great toe.  Wound appears to be a very superficial this time.  Labs were obtained while awaiting evaluation.  CBC with no leukocytosis, his lactic is negative.  BMP panel without any electrolyte derangement, however creatinine level is worse in since his visit about a month ago.  I discussed this with patient, he will receive a 500 bolus in order to help prove his levels.  He does have a reevaluation with his PCP at the end of the month.  Patient does not have any signs of  sepsis, 7 infection at this time.  I do feel it is appropriate for him to trial outpatient treatment at this time.  Will obtain wound culture at this time.  In addition, consultation placed to pharmacist in order to provide adequate therapy with the patients prior history.   Patient reassessed after after fluids.  We did discuss follow-up with PCP.  We will go home on dual antibiotic therapy after discussion with pharmacist.  Also cover for MRSA, wound culture has also been sent.  Patient hemodynamically stable for discharge.   Portions of this note were generated with Scientist, clinical (histocompatibility and immunogenetics). Dictation errors may occur despite best attempts at proofreading.  Final Clinical Impression(s) / ED Diagnoses Final diagnoses:  Diabetic foot infection (HCC)    Rx / DC Orders ED Discharge Orders          Ordered    cephALEXin (KEFLEX) 500 MG capsule  4 times daily        02/28/21 0858    doxycycline (VIBRAMYCIN) 100 MG capsule  2 times daily        02/28/21 0858              Claude Manges, PA-C 02/28/21 0086    Jacalyn Lefevre, MD 02/28/21 1057

## 2021-03-01 ENCOUNTER — Other Ambulatory Visit: Payer: Self-pay

## 2021-03-07 ENCOUNTER — Other Ambulatory Visit: Payer: Self-pay

## 2021-03-10 ENCOUNTER — Other Ambulatory Visit: Payer: Self-pay

## 2021-03-22 ENCOUNTER — Other Ambulatory Visit: Payer: Self-pay

## 2021-03-23 ENCOUNTER — Other Ambulatory Visit: Payer: Self-pay

## 2021-04-11 ENCOUNTER — Ambulatory Visit: Payer: Self-pay | Admitting: Family Medicine

## 2021-04-24 ENCOUNTER — Other Ambulatory Visit: Payer: Self-pay

## 2021-04-25 ENCOUNTER — Other Ambulatory Visit: Payer: Self-pay

## 2021-05-01 ENCOUNTER — Other Ambulatory Visit: Payer: Self-pay

## 2021-05-01 ENCOUNTER — Other Ambulatory Visit: Payer: Self-pay | Admitting: Pharmacist

## 2021-05-01 MED ORDER — EMPAGLIFLOZIN 25 MG PO TABS
25.0000 mg | ORAL_TABLET | Freq: Every day | ORAL | 2 refills | Status: DC
  Filled 2021-05-01: qty 30, 30d supply, fill #0

## 2021-05-04 ENCOUNTER — Encounter (HOSPITAL_COMMUNITY): Payer: Self-pay | Admitting: Emergency Medicine

## 2021-05-04 ENCOUNTER — Encounter: Payer: Self-pay | Admitting: Physician Assistant

## 2021-05-04 ENCOUNTER — Emergency Department (HOSPITAL_COMMUNITY)
Admission: EM | Admit: 2021-05-04 | Discharge: 2021-05-04 | Disposition: A | Discharge status: Home / Self Care | Payer: Medicaid - Out of State | Attending: Emergency Medicine | Admitting: Emergency Medicine

## 2021-05-04 ENCOUNTER — Other Ambulatory Visit: Payer: Self-pay

## 2021-05-04 ENCOUNTER — Ambulatory Visit: Payer: Self-pay | Admitting: *Deleted

## 2021-05-04 ENCOUNTER — Emergency Department (HOSPITAL_COMMUNITY): Payer: Medicaid - Out of State

## 2021-05-04 ENCOUNTER — Ambulatory Visit: Payer: Medicaid - Out of State | Attending: Family Medicine | Admitting: Physician Assistant

## 2021-05-04 VITALS — BP 120/78 | HR 93 | Resp 18 | Ht 72.0 in | Wt 270.2 lb

## 2021-05-04 DIAGNOSIS — E1169 Type 2 diabetes mellitus with other specified complication: Secondary | ICD-10-CM | POA: Diagnosis not present

## 2021-05-04 DIAGNOSIS — I129 Hypertensive chronic kidney disease with stage 1 through stage 4 chronic kidney disease, or unspecified chronic kidney disease: Secondary | ICD-10-CM | POA: Diagnosis not present

## 2021-05-04 DIAGNOSIS — Z794 Long term (current) use of insulin: Secondary | ICD-10-CM | POA: Insufficient documentation

## 2021-05-04 DIAGNOSIS — M79674 Pain in right toe(s): Secondary | ICD-10-CM | POA: Diagnosis present

## 2021-05-04 DIAGNOSIS — E1159 Type 2 diabetes mellitus with other circulatory complications: Secondary | ICD-10-CM | POA: Diagnosis not present

## 2021-05-04 DIAGNOSIS — E1122 Type 2 diabetes mellitus with diabetic chronic kidney disease: Secondary | ICD-10-CM | POA: Diagnosis not present

## 2021-05-04 DIAGNOSIS — L03031 Cellulitis of right toe: Secondary | ICD-10-CM | POA: Insufficient documentation

## 2021-05-04 DIAGNOSIS — Z7984 Long term (current) use of oral hypoglycemic drugs: Secondary | ICD-10-CM | POA: Insufficient documentation

## 2021-05-04 DIAGNOSIS — N189 Chronic kidney disease, unspecified: Secondary | ICD-10-CM | POA: Diagnosis not present

## 2021-05-04 DIAGNOSIS — R7989 Other specified abnormal findings of blood chemistry: Secondary | ICD-10-CM | POA: Insufficient documentation

## 2021-05-04 DIAGNOSIS — E1165 Type 2 diabetes mellitus with hyperglycemia: Secondary | ICD-10-CM | POA: Diagnosis not present

## 2021-05-04 DIAGNOSIS — Z79899 Other long term (current) drug therapy: Secondary | ICD-10-CM | POA: Insufficient documentation

## 2021-05-04 DIAGNOSIS — I152 Hypertension secondary to endocrine disorders: Secondary | ICD-10-CM | POA: Diagnosis not present

## 2021-05-04 LAB — CBC WITH DIFFERENTIAL/PLATELET
Abs Immature Granulocytes: 0.02 10*3/uL (ref 0.00–0.07)
Basophils Absolute: 0 10*3/uL (ref 0.0–0.1)
Basophils Relative: 1 %
Eosinophils Absolute: 0.2 10*3/uL (ref 0.0–0.5)
Eosinophils Relative: 3 %
HCT: 41.8 % (ref 39.0–52.0)
Hemoglobin: 13.6 g/dL (ref 13.0–17.0)
Immature Granulocytes: 0 %
Lymphocytes Relative: 28 %
Lymphs Abs: 1.9 10*3/uL (ref 0.7–4.0)
MCH: 28.6 pg (ref 26.0–34.0)
MCHC: 32.5 g/dL (ref 30.0–36.0)
MCV: 87.8 fL (ref 80.0–100.0)
Monocytes Absolute: 0.8 10*3/uL (ref 0.1–1.0)
Monocytes Relative: 13 %
Neutro Abs: 3.7 10*3/uL (ref 1.7–7.7)
Neutrophils Relative %: 55 %
Platelets: 242 10*3/uL (ref 150–400)
RBC: 4.76 MIL/uL (ref 4.22–5.81)
RDW: 13.8 % (ref 11.5–15.5)
WBC: 6.7 10*3/uL (ref 4.0–10.5)
nRBC: 0 % (ref 0.0–0.2)

## 2021-05-04 LAB — URINALYSIS, ROUTINE W REFLEX MICROSCOPIC
Bacteria, UA: NONE SEEN
Bilirubin Urine: NEGATIVE
Glucose, UA: >=500 mg/dL — AB
Hgb urine dipstick: NEGATIVE
Ketones, ur: NEGATIVE mg/dL
Leukocytes,Ua: NEGATIVE
Nitrite: NEGATIVE
Protein, ur: NEGATIVE mg/dL
Specific Gravity, Urine: 1.022 (ref 1.005–1.030)
pH: 5 (ref 5.0–8.0)

## 2021-05-04 LAB — POCT GLYCOSYLATED HEMOGLOBIN (HGB A1C): Hemoglobin A1C: 10.9 % — AB (ref 4.0–5.6)

## 2021-05-04 LAB — COMPREHENSIVE METABOLIC PANEL
ALT: 21 U/L (ref 0–44)
AST: 17 U/L (ref 15–41)
Albumin: 3.8 g/dL (ref 3.5–5.0)
Alkaline Phosphatase: 118 U/L (ref 38–126)
Anion gap: 11 (ref 5–15)
BUN: 32 mg/dL — ABNORMAL HIGH (ref 6–20)
CO2: 22 mmol/L (ref 22–32)
Calcium: 8.6 mg/dL — ABNORMAL LOW (ref 8.9–10.3)
Chloride: 101 mmol/L (ref 98–111)
Creatinine, Ser: 2.23 mg/dL — ABNORMAL HIGH (ref 0.61–1.24)
GFR, Estimated: 38 mL/min — ABNORMAL LOW (ref >60)
Glucose, Bld: 344 mg/dL — ABNORMAL HIGH (ref 70–99)
Potassium: 4.8 mmol/L (ref 3.5–5.1)
Sodium: 134 mmol/L — ABNORMAL LOW (ref 135–145)
Total Bilirubin: 0.4 mg/dL (ref 0.3–1.2)
Total Protein: 7.9 g/dL (ref 6.5–8.1)

## 2021-05-04 LAB — GLUCOSE, POCT (MANUAL RESULT ENTRY): POC Glucose: 233 mg/dl — AB (ref 70–99)

## 2021-05-04 MED ORDER — ATORVASTATIN CALCIUM 20 MG PO TABS
20.0000 mg | ORAL_TABLET | Freq: Every day | ORAL | 2 refills | Status: DC
  Filled 2021-05-04: qty 30, 30d supply, fill #0

## 2021-05-04 MED ORDER — DOXYCYCLINE HYCLATE 100 MG PO TABS
100.0000 mg | ORAL_TABLET | Freq: Two times a day (BID) | ORAL | 0 refills | Status: DC
Start: 2021-05-04 — End: 2021-05-04
  Filled 2021-05-04: qty 20, 10d supply, fill #0

## 2021-05-04 MED ORDER — CEPHALEXIN 500 MG PO CAPS: 500.0000 mg | ORAL_CAPSULE | Freq: Four times a day (QID) | ORAL | 0 refills | Status: AC

## 2021-05-04 MED ORDER — DOXYCYCLINE HYCLATE 100 MG PO TABS
100.0000 mg | ORAL_TABLET | Freq: Once | ORAL | Status: AC
  Administered 2021-05-04: 100 mg via ORAL
  Filled 2021-05-04: qty 1

## 2021-05-04 MED ORDER — CEPHALEXIN 500 MG PO CAPS
500.0000 mg | ORAL_CAPSULE | Freq: Four times a day (QID) | ORAL | 0 refills | Status: DC
  Filled 2021-05-04: qty 40, 10d supply, fill #0

## 2021-05-04 MED ORDER — DOXYCYCLINE HYCLATE 100 MG PO TABS: 100.0000 mg | ORAL_TABLET | Freq: Two times a day (BID) | ORAL | 0 refills | Status: AC

## 2021-05-04 MED ORDER — TRUEPLUS PEN NEEDLES 32G X 4 MM MISC
11 refills | Status: DC
  Filled 2021-05-04: qty 100, 30d supply, fill #0

## 2021-05-04 MED ORDER — TRULICITY 3 MG/0.5ML ~~LOC~~ SOAJ
3.0000 mg | SUBCUTANEOUS | 3 refills | Status: DC
  Filled 2021-05-04: qty 2, 28d supply, fill #0

## 2021-05-04 MED ORDER — "INSULIN SYRINGE-NEEDLE U-100 31G X 5/16"" 0.3 ML MISC"
3 refills | Status: DC
  Filled 2021-05-04: qty 100, 50d supply, fill #0

## 2021-05-04 MED ORDER — NOVOLIN 70/30 (70-30) 100 UNIT/ML ~~LOC~~ SUSP
28.0000 [IU] | Freq: Two times a day (BID) | SUBCUTANEOUS | 6 refills | Status: DC
  Filled 2021-05-04: qty 30, 54d supply, fill #0

## 2021-05-04 MED ORDER — AMLODIPINE BESYLATE 10 MG PO TABS
10.0000 mg | ORAL_TABLET | Freq: Every day | ORAL | 1 refills | Status: DC
  Filled 2021-05-04: qty 90, 90d supply, fill #0

## 2021-05-04 MED ORDER — LISINOPRIL-HYDROCHLOROTHIAZIDE 20-25 MG PO TABS
1.0000 | ORAL_TABLET | Freq: Every day | ORAL | 1 refills | Status: DC
Start: 2021-05-04 — End: 2021-06-12
  Filled 2021-05-04: qty 90, 90d supply, fill #0

## 2021-05-04 MED ORDER — TRULICITY 1.5 MG/0.5ML ~~LOC~~ SOPN
1.5000 mg | PEN_INJECTOR | SUBCUTANEOUS | 0 refills | Status: AC
Start: 2021-05-04 — End: 2021-06-02
  Filled 2021-05-04: qty 2, 28d supply, fill #0

## 2021-05-04 NOTE — Discharge Instructions (Signed)
Take the antibiotics as prescribed.  Follow-up with your doctor next week to be rechecked.  Return to the ER for fevers chills or worsening signs of infection in your toe. ? ?Follow-up with your primary care doctor to have your kidney function and blood sugars rechecked ?

## 2021-05-04 NOTE — ED Provider Triage Note (Signed)
Emergency Medicine Provider Triage Evaluation Note ? ?Jerry Arnold , a 36 y.o. male  was evaluated in triage.  Pt complains of right big toe wound that has been ongoing since Monday.  Patient reports he had similar episode of this toe back in January which was successfully treated with oral antibiotics.  He has been reports he had resolution of his symptoms until he wore heels that he wore back in January before the previous episode.  Denies fevers, chills.  Denies pain but he also does not have sensation in his foot.  States he was evaluated at PCP this morning for his 73-month diabetes checkup however his foot was not evaluated. ? ?Review of Systems  ?Positive: As above ?Negative: As above ? ?Physical Exam  ?BP 137/84 (BP Location: Right Arm)   Pulse 97   Temp 98.7 ?F (37.1 ?C) (Oral)   Resp 14   SpO2 97%  ?Gen:   Awake, no distress   ?Resp:  Normal effort  ?MSK:   Moves extremities without difficulty  ?Other:  Visible wound to right great toe.  Without apparent drainage.  2+ DP pulse present bilaterally.   ? ?Medical Decision Making  ?Medically screening exam initiated at 2:10 PM.  Appropriate orders placed.  Jerry Arnold was informed that the remainder of the evaluation will be completed by another provider, this initial triage assessment does not replace that evaluation, and the importance of remaining in the ED until their evaluation is complete. ? ? ?  ?Jerry Kansas, PA-C ?05/04/21 1411 ? ?

## 2021-05-04 NOTE — Telephone Encounter (Signed)
?  Chief Complaint: open wound- R great toe ?Symptoms: pain, redness ?Frequency: 1 week ?Pertinent Negatives: Patient denies fever ?Disposition: [] ED /[x] Urgent Care (no appt availability in office) / [] Appointment(In office/virtual)/ []  [place] [facility]/ [] Home Care/ [] Refused Recommended Disposition /[]  Mobile Bus/ []  Follow-up with PCP ?Additional Notes: Patient was seen today- but feet not checked- he reports he has concerns about this sore and ability to heal properly due to diabetes   ?

## 2021-05-04 NOTE — ED Provider Notes (Signed)
[facility] EMERGENCY DEPARTMENT Provider Note   CSN: [id number] Arrival date & time: 05/04/21  1321     History  Chief Complaint  Patient presents with   Wound Check    Jerry Arnold is a 36 y.o. male.   Wound Check  Patient has history of diabetes as well as osteomyelitis, hypertension and prior amputation of a toe on his right foot. Patient presented to the ED for evaluation of a possible toe infection.  Patient states he sustained a small cut to his toe several weeks ago after wearing ill fitting shoes.  Patient was started on antibiotics and felt like his symptoms were improving.  Patient states he wore those same shoes again and felt like it recently exacerbated his symptoms.  This morning he noticed increased odor to the wound.  There is also some drainage.  He states it is not painful but he has decreased sensation in that toe.  Outpatient records reviewed.  Patient was seen at his primary care doctor's office today.  He has not been compliant with his blood sugar medications and his blood sugars have recently been elevated.  There is no mention of his foot and infection in those notes  Home Medications Prior to Admission medications   Medication Sig Start Date End Date Taking? Authorizing Provider  cephALEXin (KEFLEX) 500 MG capsule Take 1 capsule (500 mg total) by mouth 4 (four) times daily for 10 days. 05/04/21 05/14/21 Yes Linwood Dibbles, MD  doxycycline (VIBRA-TABS) 100 MG tablet Take 1 tablet (100 mg total) by mouth 2 (two) times daily for 10 days. 05/04/21 05/14/21 Yes Linwood Dibbles, MD  acetaminophen (TYLENOL) 500 MG tablet Take 1 tablet (500 mg total) by mouth every 6 (six) hours as needed. 09/07/18   Fayrene Helper, PA-C  amLODipine (NORVASC) 10 MG tablet Take 1 tablet (10 mg total) by mouth daily. 05/04/21   Anders Simmonds, PA-C  atorvastatin (LIPITOR) 20 MG tablet Take 1 tablet (20 mg total) by mouth daily. 05/04/21   Anders Simmonds, PA-C  Dulaglutide  (TRULICITY) 1.5 MG/0.5ML SOPN Inject 1.5 mg into the skin once a week. For 1 month then increase dose 05/04/21 06/02/21  Anders Simmonds, PA-C  Dulaglutide (TRULICITY) 3 MG/0.5ML SOPN Inject 3 mg as directed once a week. 06/09/21   Anders Simmonds, PA-C  empagliflozin (JARDIANCE) 25 MG TABS tablet Take 1 tablet (25 mg total) by mouth daily before breakfast. 05/01/21   Hoy Register, MD  insulin NPH-regular Human (NOVOLIN 70/30) (70-30) 100 UNIT/ML injection Inject 28 Units into the skin 2 (two) times daily with a meal. 05/04/21 05/04/22  McClung, Marzella Schlein, PA-C  Insulin Pen Needle (TRUEPLUS PEN NEEDLES) 32G X 4 MM MISC Use as directed to inject Trulicity 05/04/21   McClung, Marzella Schlein, PA-C  Insulin Syringe-Needle U-100 (TRUEPLUS INSULIN SYRINGE) 31G X 5/16" 0.3 ML MISC Use as directed to administer insulin twice daily 05/04/21   Anders Simmonds, PA-C  lisinopril-hydrochlorothiazide (ZESTORETIC) 20-25 MG tablet TAKE 1 TABLET BY MOUTH DAILY. 05/04/21 05/04/22  Anders Simmonds, PA-C  metoCLOPramide (REGLAN) 5 MG tablet Take 1 tablet (5 mg total) by mouth every 6 (six) hours as needed for nausea. Patient not taking: Reported on 11/27/2017 04/24/17 09/07/18  Vivianne Master, PA-C      Allergies    Patient has no known allergies.    Review of Systems   Review of Systems  Constitutional:  Negative for fever.   Physical Exam Updated Vital Signs  BP 137/84 (BP Location: Right Arm)   Pulse 97   Temp 98.7 F (37.1 C) (Oral)   Resp 14   SpO2 97%  Physical Exam Vitals and nursing note reviewed.  Constitutional:      General: He is not in acute distress.    Appearance: He is well-developed.  HENT:     Head: Normocephalic and atraumatic.     Right Ear: External ear normal.     Left Ear: External ear normal.  Eyes:     General: No scleral icterus.       Right eye: No discharge.        Left eye: No discharge.     Conjunctiva/sclera: Conjunctivae normal.  Neck:     Trachea: No tracheal deviation.   Cardiovascular:     Rate and Rhythm: Normal rate and regular rhythm.  Pulmonary:     Effort: Pulmonary effort is normal. No respiratory distress.     Breath sounds: Normal breath sounds. No stridor.  Abdominal:     General: There is no distension.  Musculoskeletal:        General: No swelling or deformity.     Cervical back: Neck supple.     Comments: Callused wound noted right big toe, malodorous , no surrounding erythema  Skin:    General: Skin is warm and dry.     Findings: No rash.  Neurological:     Mental Status: He is alert.     Cranial Nerves: Cranial nerve deficit: no gross deficits.    ED Results / Procedures / Treatments   Labs (all labs ordered are listed, but only abnormal results are displayed) Labs Reviewed  COMPREHENSIVE METABOLIC PANEL - Abnormal; Notable for the following components:      Result Value   Sodium 134 (*)    Glucose, Bld 344 (*)    BUN 32 (*)    Creatinine, Ser 2.23 (*)    Calcium 8.6 (*)    GFR, Estimated 38 (*)    All other components within normal limits  URINALYSIS, ROUTINE W REFLEX MICROSCOPIC - Abnormal; Notable for the following components:   Color, Urine STRAW (*)    Glucose, UA >=500 (*)    All other components within normal limits  CBC WITH DIFFERENTIAL/PLATELET    EKG None  Radiology DG Foot Complete Right  Result Date: 05/04/2021 CLINICAL DATA:  Pain, nonhealing wound in the right big toe EXAM: RIGHT FOOT COMPLETE - 3+ VIEW COMPARISON:  02/27/2021 FINDINGS: No recent fracture or dislocation is seen. No focal lytic lesions are seen. There is previous removal of second toe and portions of second metatarsal. There are 2 mm smooth marginated calcifications adjacent to base and head of first metatarsal, possibly residual from previous injury. Small bony spurs seen in first metatarsophalangeal joint. IMPRESSION: No recent fracture or dislocation is seen. No focal lytic lesions are seen. If there is clinical suspicion for  osteomyelitis, follow-up MRI may be considered. Electronically Signed   By: Ernie Avena M.D.   On: 05/04/2021 14:39    Procedures Procedures    Medications Ordered in ED Medications  doxycycline (VIBRA-TABS) tablet 100 mg (has no administration in time range)    ED Course/ Medical Decision Making/ A&P Clinical Course as of 05/04/21 1540  Thu May 04, 2021  1501 CBC with Differential White blood cell count normal [JK]  1501 Comprehensive metabolic panel(!) Labs show hyperglycemia and elevated creatinine, worsening compared to previous values [JK]  1502 DG Foot Complete Right  Foot x-ray reviewed.  No obvious osteomyelitis [JK]    Clinical Course User Index [JK] Linwood Dibbles, MD                           Medical Decision Making Amount and/or Complexity of Data Reviewed External Data Reviewed: notes.    Details: Reviewed notes from Drs. Visit today.  No mention of his toe infection during the visit.  Patient states he did not tell the doctor about his toe and they did not examine his feet Labs: ordered. Decision-making details documented in ED Course. Radiology: ordered and independent interpretation performed. Decision-making details documented in ED Course.  Risk Prescription drug management. Decision regarding hospitalization.  Patient presented to the ED for evaluation of a persistent infection in his toe.  Patient was seen back in January for similar symptoms.  I reviewed the notes from that visit.  I also reviewed the images taken of his foot at that time.  Appearance is similar today.  Labs do not show any signs of leukocytosis.  He is not having any systemic signs of infection.  Plain films although limited did not show signs of osteomyelitis.  I am concerned in the past the patient's worsening renal function over time as well as his hyperglycemia.  These do appear chronic however and not acutely changed today.  He certainly is at risk for more severe infection.  He has  had a toe amputation in the past.  Discussed options of admission to the hospital for IV antibiotics versus oral antibiotics and close follow-up.  Patient feels like he did improve after oral antibiotics last time.  He would prefer to try oral antibiotics and does not want to be admitted at this time.  Strict warning signs and precautions discussed.        Final Clinical Impression(s) / ED Diagnoses Final diagnoses:  Cellulitis of toe of right foot  Type 2 diabetes mellitus with diabetic chronic kidney disease, unspecified CKD stage, unspecified whether long term insulin use (HCC)    Rx / DC Orders ED Discharge Orders          Ordered    cephALEXin (KEFLEX) 500 MG capsule  4 times daily        05/04/21 1536    doxycycline (VIBRA-TABS) 100 MG tablet  2 times daily        05/04/21 1536              Linwood Dibbles, MD 05/04/21 1540

## 2021-05-04 NOTE — Patient Instructions (Signed)
Take trulicity 1.5 mg weekly for 4 weeks;  then start new prescription of trulicty and inject 3mg  weekly thereafter ? ?Check blood sugars fasting and bedtime and record and bring to next visit and you will have labs that day when you see ?

## 2021-05-04 NOTE — Progress Notes (Signed)
Patient ID: Jerry Arnold, male   DOB: 1985-03-06, 36 y.o.   MRN: [id number] ? ? ?Jerry Arnold, is a 36 y.o. male ? ?[id number] ? ?[id number] ? ?DOB - 01-Jun-1985 ? ?Chief Complaint  ?Patient presents with  ? Diabetes  ?    ? ?Subjective:  ? ?Jerry Arnold is a 36 y.o. male here today for a diabetes and med check.  He took the titration of trulicity until the endo of February when he got to the 4.5mg  dosing and it made him very sick.  He stopped the medication all together at that time.  He was tolerating it ok up to the 3.0 mg dosing weekly ? ?So, for the last 3 weeks he has only been taking the 70/30 bid.  No other issues or concerns.  He checks his blood sugars about every other day and it's usu 120-230s ? ?No problems updated. ? ?ALLERGIES: ?No Known Allergies ? ?PAST MEDICAL HISTORY: ?Past Medical History:  ?Diagnosis Date  ? Hypertension   ? Osteomyelitis (HCC) 04/11/2017  ? with "second toe on right foot" injury x 2-3 weeks (04/11/2017)  ? Type II diabetes mellitus (HCC)   ? ? ?MEDICATIONS AT HOME: ?Prior to Admission medications   ?Medication Sig Start Date End Date Taking? Authorizing Provider  ?acetaminophen (TYLENOL) 500 MG tablet Take 1 tablet (500 mg total) by mouth every 6 (six) hours as needed. 09/07/18  Yes Fayrene Helper, PA-C  ?Dulaglutide (TRULICITY) 1.5 MG/0.5ML SOPN Inject 1.5 mg into the skin once a week. For 1 month then increase dose 05/04/21 06/02/21 Yes Clovis Mankins, Marzella Schlein, PA-C  ?amLODipine (NORVASC) 10 MG tablet Take 1 tablet (10 mg total) by mouth daily. 05/04/21   Anders Simmonds, PA-C  ?atorvastatin (LIPITOR) 20 MG tablet Take 1 tablet (20 mg total) by mouth daily. 05/04/21   Anders Simmonds, PA-C  ?Dulaglutide (TRULICITY) 3 MG/0.5ML SOPN Inject 3 mg as directed once a week. 06/09/21   Anders Simmonds, PA-C  ?empagliflozin (JARDIANCE) 25 MG TABS tablet Take 1 tablet (25 mg total) by mouth daily before breakfast. 05/01/21   Hoy Register, MD  ?insulin NPH-regular Human (NOVOLIN 70/30)  (70-30) 100 UNIT/ML injection Inject 28 Units into the skin 2 (two) times daily with a meal. 05/04/21 05/04/22  Anders Simmonds, PA-C  ?Insulin Pen Needle (TRUEPLUS PEN NEEDLES) 32G X 4 MM MISC Use as directed to inject Trulicity 05/04/21   Anders Simmonds, PA-C  ?Insulin Syringe-Needle U-100 (TRUEPLUS INSULIN SYRINGE) 31G X 5/16" 0.3 ML MISC Use as directed to administer insulin twice daily 05/04/21   Anders Simmonds, PA-C  ?lisinopril-hydrochlorothiazide (ZESTORETIC) 20-25 MG tablet TAKE 1 TABLET BY MOUTH DAILY. 05/04/21 05/04/22  Anders Simmonds, PA-C  ?metoCLOPramide (REGLAN) 5 MG tablet Take 1 tablet (5 mg total) by mouth every 6 (six) hours as needed for nausea. ?Patient not taking: Reported on 11/27/2017 04/24/17 09/07/18  Vivianne Master, PA-C  ? ? ?ROS: ?Neg HEENT ?Neg resp ?Neg cardiac ?Neg GI ?Neg GU ?Neg MS ?Neg psych ?Neg neuro ? ?Objective:  ? ?Vitals:  ? 05/04/21 1007  ?BP: 120/78  ?Pulse: 93  ?Resp: 18  ?SpO2: 98%  ?Weight: 270 lb 4 oz (122.6 kg)  ?Height: 6' (1.829 m)  ? ?Exam ?General appearance : Awake, alert, not in any distress. Speech Clear. Not toxic looking ?HEENT: Atraumatic and Normocephalic ?Neck: Supple, no JVD. No cervical lymphadenopathy.  ?Chest: Good air entry bilaterally, CTAB.  No rales/rhonchi/wheezing ?CVS: S1 S2 regular, no murmurs.  ?Extremities:  B/L Lower Ext shows no edema, both legs are warm to touch ?Neurology: Awake alert, and oriented X 3, CN II-XII intact, Non focal ?Skin: No Rash ? ?Data Review ?Lab Results  ?Component Value Date  ? HGBA1C 10.9 (A) 05/04/2021  ? HGBA1C 8.6 (A) 01/05/2021  ? HGBA1C 8.1 (A) 10/03/2020  ? ? ?Assessment & Plan  ? ?1. Type 2 diabetes mellitus with other specified complication, with long-term current use of insulin (HCC) ?Uncontrolled but not taking trulicity for almost a month now due to SE with 4.5mg  dosing.  He is agreeable to resume trulicity at 1 month of 1.5mg  weekly then titrate to 3mg  weekly and see in 6 weeks and bring in blood  sugar readings.   ?- Glucose (CBG) ?- HgB A1c ?- Dulaglutide (TRULICITY) 3 MG/0.5ML SOPN; Inject 3 mg as directed once a week.  Dispense: 2 mL; Refill: 3 ?- insulin NPH-regular Human (NOVOLIN 70/30) (70-30) 100 UNIT/ML injection; Inject 28 Units into the skin 2 (two) times daily with a meal.  Dispense: 30 mL; Refill: 6 ?- Insulin Pen Needle (TRUEPLUS PEN NEEDLES) 32G X 4 MM MISC; Use as directed to inject Trulicity  Dispense: 100 each; Refill: 11 ?- Insulin Syringe-Needle U-100 (TRUEPLUS INSULIN SYRINGE) 31G X 5/16" 0.3 ML MISC; Use as directed to administer insulin twice daily  Dispense: 100 each; Refill: 3 ?- Dulaglutide (TRULICITY) 1.5 MG/0.5ML SOPN; Inject 1.5 mg into the skin once a week. For 1 month then increase dose  Dispense: 2 mL; Refill: 0 ?- Comprehensive metabolic panel; Future ?- CBC with Differential/Platelet; Future ? ?2. Hypertension associated with diabetes (HCC) ?Controlled-continue ?- amLODipine (NORVASC) 10 MG tablet; Take 1 tablet (10 mg total) by mouth daily.  Dispense: 90 tablet; Refill: 1 ?- lisinopril-hydrochlorothiazide (ZESTORETIC) 20-25 MG tablet; TAKE 1 TABLET BY MOUTH DAILY.  Dispense: 90 tablet; Refill: 1 ? ?3. Hyperlipidemia associated with type 2 diabetes mellitus (HCC) ?- atorvastatin (LIPITOR) 20 MG tablet; Take 1 tablet (20 mg total) by mouth daily.  Dispense: 30 tablet; Refill: 2 ?- Lipid panel; Future ? ? ? ?Patient have been counseled extensively about nutrition and exercise. Other issues discussed during this visit include: low cholesterol diet, weight control and daily exercise, foot care, annual eye examinations at Ophthalmology, importance of adherence with medications and regular follow-up. We also discussed long term complications of uncontrolled diabetes and hypertension.  ? ?Return for 6 weeks with 6/16 and 3 months with PCP. ? ?The patient was given clear instructions to go to ER or return to medical center if symptoms don't improve, worsen or new problems develop.  The patient verbalized understanding. The patient was told to call to get lab results if they haven't heard anything in the next week.  ? ? ? ? ?Franky Macho, PA-C ?[facility] ?[place], [place] ?[phone]   ?05/04/2021, 12:03 PM  ?

## 2021-05-04 NOTE — ED Notes (Signed)
Pt in xray

## 2021-05-04 NOTE — Telephone Encounter (Signed)
Reason for Disposition ? [1] Using antibiotic ointment > 1 week AND [2] sore not completely healed ? ?Answer Assessment - Initial Assessment Questions ?1. APPEARANCE of SORES: "What do the sores look like?" ?    Open wound on side and top of toe ?2. NUMBER: "How many sores are there?" ?    1 ?3. SIZE: "How big is the largest sore?" ?    Tic tac ?4. LOCATION: "Where are the sores located?" ?    R great toe ?5. ONSET: "When did the sores begin?" ?    January ?6. CAUSE: "What do you think is causing the sores?" ?    Shoes may be causing friction and open sore ?7. OTHER SYMPTOMS: "Do you have any other symptoms?" (e.g., fever, new weakness) ?    no ? ?Protocols used: Sores-A-AH ? ?

## 2021-05-04 NOTE — ED Triage Notes (Signed)
Patient here for re-evaluation of wound on right great toe that he first noticed in January this year, was prescribed an antibiotic and it had mostly healed but then the wound has reappeared. 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 Patient states he will be disposing of these shoes. No drainage noted, patient reports some scant drainage when squeezed earlier today. Patient is alert, oriented, ambulatory, and in no apparent distress at this time. ?

## 2021-05-08 ENCOUNTER — Telehealth: Payer: Self-pay | Admitting: Family Medicine

## 2021-05-08 NOTE — Telephone Encounter (Signed)
Pt is calling to let Dr. Alvis Lemmings know that pt needs a PA for Dulaglutide (TRULICITY) 3 MG/0.5ML [facility] [[id number]]  pharmacy [address], [place] [phone] ?

## 2021-05-08 NOTE — Telephone Encounter (Signed)
Jerry Arnold,  ? ?Have you received anything concerning a PA for this patient's Trulicity?  ?

## 2021-05-09 ENCOUNTER — Other Ambulatory Visit: Payer: Self-pay

## 2021-06-12 ENCOUNTER — Encounter: Payer: Self-pay | Admitting: Pharmacist

## 2021-06-12 ENCOUNTER — Other Ambulatory Visit: Payer: Self-pay

## 2021-06-12 ENCOUNTER — Ambulatory Visit: Payer: Medicaid - Out of State

## 2021-06-12 ENCOUNTER — Ambulatory Visit: Payer: Medicaid - Out of State | Attending: Family Medicine | Admitting: Pharmacist

## 2021-06-12 DIAGNOSIS — Z794 Long term (current) use of insulin: Secondary | ICD-10-CM

## 2021-06-12 DIAGNOSIS — E1159 Type 2 diabetes mellitus with other circulatory complications: Secondary | ICD-10-CM

## 2021-06-12 DIAGNOSIS — I152 Hypertension secondary to endocrine disorders: Secondary | ICD-10-CM

## 2021-06-12 DIAGNOSIS — E1169 Type 2 diabetes mellitus with other specified complication: Secondary | ICD-10-CM | POA: Diagnosis not present

## 2021-06-12 DIAGNOSIS — I10 Essential (primary) hypertension: Secondary | ICD-10-CM

## 2021-06-12 DIAGNOSIS — E785 Hyperlipidemia, unspecified: Secondary | ICD-10-CM

## 2021-06-12 MED ORDER — ACCU-CHEK SOFTCLIX LANCETS MISC: 2 refills | Status: AC

## 2021-06-12 MED ORDER — EMPAGLIFLOZIN 25 MG PO TABS: 25.0000 mg | ORAL_TABLET | Freq: Every day | ORAL | 2 refills | Status: DC

## 2021-06-12 MED ORDER — ATORVASTATIN CALCIUM 20 MG PO TABS: 20.0000 mg | ORAL_TABLET | Freq: Every day | ORAL | 2 refills | Status: DC

## 2021-06-12 MED ORDER — ACCU-CHEK GUIDE W/DEVICE KIT: PACK | 0 refills | Status: AC

## 2021-06-12 MED ORDER — AMLODIPINE BESYLATE 10 MG PO TABS: 10.0000 mg | ORAL_TABLET | Freq: Every day | ORAL | 1 refills | Status: DC

## 2021-06-12 MED ORDER — TRULICITY 1.5 MG/0.5ML ~~LOC~~ SOAJ: 1.5000 mg | SUBCUTANEOUS | 1 refills | Status: DC

## 2021-06-12 MED ORDER — LISINOPRIL-HYDROCHLOROTHIAZIDE 20-25 MG PO TABS: 1.0000 | ORAL_TABLET | Freq: Every day | ORAL | 1 refills | Status: DC

## 2021-06-12 MED ORDER — ACCU-CHEK GUIDE VI STRP: ORAL_STRIP | 2 refills | Status: AC

## 2021-06-12 NOTE — Progress Notes (Signed)
? ? ?  S:    ?PCP: Dr. Alvis Lemmings  ? ?No chief complaint on file. ? ?Patient arrives in good spirits. Presents for  DM management at the request of Georgian Co on 05/04/2021. PMH significant for HTN, T2DM, obesity, HLD. A1c was found to be 10.9% at his visit with Marylene Land. Additionally, he has a hx of CKD, osteomyelitis, and diabetic foot infection. Of note, he stopped Trulicity d/t side effects before that appointment with Marylene Land and had only been taking 70/30 insulin. Trulicity was restarted but at a lower dose that the patient tolerated well in the past.  ? ?Family/Social History:  ?FHX: HTN (mother), DM, HTN ?Tobacco: never smoker ?Alcohol: drinks alcohol only seldomly ? ?Insurance coverage/medication affordability: Self-pay ? ?Patient denies adherence with medications. Never filled Trulicity or Jardiance after his last appt.  ?Current diabetes medications include: Jardiance 25mg  daily, Novolin 70/30 28 units BID (taking 29u BID), Trulicity 3mg  weekly  ? ?Patient denies hypoglycemic events. ? ?Patient reported dietary habits:  ?- Pt reports limiting carbs and sweet foods ?-"I adhere to a diabetic diet"; denies changes  ? ?Patient-reported exercise habits:  ?- Reports walking ~30-40 minutes daily ?  ?Patient denies nocturia.  ?Patient denies neuropathy. ?Patient denies visual changes. ?Patient reports self foot exams.  ? ?O:  ?Patient reports that his meter has been broken since December.  ? ?Lab Results  ?Component Value Date  ? HGBA1C 10.9 (A) 05/04/2021  ? ?There were no vitals filed for this visit. ? ?Lipid Panel  ?   ?Component Value Date/Time  ? CHOL 134 01/05/2021 1118  ? TRIG 110 01/05/2021 1118  ? HDL 37 (L) 01/05/2021 1118  ? CHOLHDL 5.9 (H) 07/04/2020 1108  ? CHOLHDL 3.8 04/12/2017 0036  ? VLDL 18 04/12/2017 0036  ? LDLCALC 77 01/05/2021 1118  ? ? ?Clinical ASCVD: No  ?The ASCVD Risk score (Arnett DK, et al., 2019) failed to calculate for the following reasons: ?  The 2019 ASCVD risk score is only valid for  ages 75 to 49  ? ?A/P: ?Diabetes longstanding currently uncontrolled based on A1c of 10.9%. Patient is able to verbalize appropriate hypoglycemia management plan. Patient is not adherent with medication. I called his pharmacy and this is an issue with his Medicaid. He has [place] Medicaid. I called and send his rxs to a [facility] that will accept [facility] Medicaid in [facility] [place]. Pt will contact me with any continued issues with his insurance/pharmacy. He has been instructed to increase Novolin 70/30 to 34 units BID if his home sugars are above goal and he cannot get the Trulicity or Jardiance. ?-Continued Novolin 70/30 29 units BID for now. ?-Pick-up and restart Trulicity and Jardiance.  ?-Extensively discussed pathophysiology of DM, recommended lifestyle interventions, dietary effects on glycemic control ?-Counseled on s/sx of and management of hypoglycemia ?-Next A1C anticipated 07/2021. ?-Labs today per 04-09-1971,   ? ?Written patient instructions provided.  Total time in face to face counseling 30 minutes.   ?Follow up Pharmacist Clinic Visit in 1 month.   ? ?08/2021, PharmD, BCACP, CPP ?Clinical Pharmacist ?[facility] ?[phone] ? ? ? ? ?

## 2021-06-13 ENCOUNTER — Telehealth: Payer: Self-pay | Admitting: Family Medicine

## 2021-06-13 LAB — CBC WITH DIFFERENTIAL/PLATELET
Basophils Absolute: 0.1 10*3/uL (ref 0.0–0.2)
Basos: 1 %
EOS (ABSOLUTE): 0.3 10*3/uL (ref 0.0–0.4)
Eos: 5 %
Hematocrit: 45.1 % (ref 37.5–51.0)
Hemoglobin: 14.8 g/dL (ref 13.0–17.7)
Immature Grans (Abs): 0 10*3/uL (ref 0.0–0.1)
Immature Granulocytes: 0 %
Lymphocytes Absolute: 2.7 10*3/uL (ref 0.7–3.1)
Lymphs: 47 %
MCH: 28.1 pg (ref 26.6–33.0)
MCHC: 32.8 g/dL (ref 31.5–35.7)
MCV: 86 fL (ref 79–97)
Monocytes Absolute: 0.6 10*3/uL (ref 0.1–0.9)
Monocytes: 11 %
Neutrophils Absolute: 2 10*3/uL (ref 1.4–7.0)
Neutrophils: 36 %
Platelets: 241 10*3/uL (ref 150–450)
RBC: 5.27 x10E6/uL (ref 4.14–5.80)
RDW: 14.2 % (ref 11.6–15.4)
WBC: 5.6 10*3/uL (ref 3.4–10.8)

## 2021-06-13 LAB — COMPREHENSIVE METABOLIC PANEL
ALT: 43 IU/L (ref 0–44)
AST: 26 IU/L (ref 0–40)
Albumin/Globulin Ratio: 1.5 (ref 1.2–2.2)
Albumin: 4.6 g/dL (ref 4.0–5.0)
Alkaline Phosphatase: 130 IU/L — ABNORMAL HIGH (ref 44–121)
BUN/Creatinine Ratio: 15 (ref 9–20)
BUN: 24 mg/dL — ABNORMAL HIGH (ref 6–20)
Bilirubin Total: 0.3 mg/dL (ref 0.0–1.2)
CO2: 25 mmol/L (ref 20–29)
Calcium: 10 mg/dL (ref 8.7–10.2)
Chloride: 103 mmol/L (ref 96–106)
Creatinine, Ser: 1.61 mg/dL — ABNORMAL HIGH (ref 0.76–1.27)
Globulin, Total: 3.1 g/dL (ref 1.5–4.5)
Glucose: 173 mg/dL — ABNORMAL HIGH (ref 70–99)
Potassium: 4.9 mmol/L (ref 3.5–5.2)
Sodium: 141 mmol/L (ref 134–144)
Total Protein: 7.7 g/dL (ref 6.0–8.5)
eGFR: 56 mL/min/{1.73_m2} — ABNORMAL LOW (ref >59)

## 2021-06-13 LAB — LIPID PANEL
Chol/HDL Ratio: 3.1 ratio (ref 0.0–5.0)
Cholesterol, Total: 165 mg/dL (ref 100–199)
HDL: 54 mg/dL (ref >39)
LDL Chol Calc (NIH): 94 mg/dL (ref 0–99)
Triglycerides: 95 mg/dL (ref 0–149)
VLDL Cholesterol Cal: 17 mg/dL (ref 5–40)

## 2021-06-13 NOTE — Telephone Encounter (Signed)
Copied from [place] [id number]. Topic: General - Other ?>> Jun 13, 2021  9:21 AM McGill, Nelva Bush wrote: ?Reason for CRM: Pt requesting a call back to discuss recent labs. ?

## 2021-06-13 NOTE — Telephone Encounter (Signed)
Pt was called and informed that he will be called once results are placed by provider. ?

## 2021-06-14 NOTE — Telephone Encounter (Signed)
Pt given lab results per notes of Georgian Co, PA-C on 06/13/21. Pt verbalized understanding. ? ? ? ?Anders Simmonds, PA-C  ?06/13/2021  3:12 PM EDT Back to Top  ?  ?Please call patient. Kidney function is slightly abnormal. Better blood sugar controls and drinking water will help. Electrolytes, blood count, and cholesterol are normal/stable. Follow up as planned. Thank you! Georgian Co, PA-C  ? ?

## 2021-06-15 ENCOUNTER — Telehealth: Payer: Self-pay

## 2021-06-15 NOTE — Telephone Encounter (Signed)
-----   Message from Anders Simmonds, [place] sent at 06/13/2021  3:12 PM EDT ----- ?Please call patient. Kidney function is slightly abnormal. Better blood sugar controls and drinking water will help. Electrolytes, blood count, and cholesterol are normal/stable. Follow up as planned. Thank you! Georgian Co, PA-C ?

## 2021-06-15 NOTE — Telephone Encounter (Signed)
Spoke with patient he verified date of birth. He is aware that kidney function is slightly abnormal but with better control of blood sugars and increasing water intake could improve. All other labs normal. He verbalized understanding. Maryjean Morn, CMA ?

## 2021-07-11 ENCOUNTER — Ambulatory Visit: Payer: Medicaid - Out of State | Admitting: Pharmacist

## 2021-07-12 ENCOUNTER — Other Ambulatory Visit: Payer: Self-pay | Admitting: Pharmacist

## 2021-07-12 ENCOUNTER — Telehealth: Payer: Self-pay | Admitting: Family Medicine

## 2021-07-12 DIAGNOSIS — Z794 Long term (current) use of insulin: Secondary | ICD-10-CM

## 2021-07-12 MED ORDER — NOVOLIN 70/30 (70-30) 100 UNIT/ML ~~LOC~~ SUSP: 28.0000 [IU] | Freq: Two times a day (BID) | SUBCUTANEOUS | 6 refills | Status: DC

## 2021-07-12 NOTE — Telephone Encounter (Signed)
Patient called to request a refill on his Novolin 70/30 to his [facility] in [facility]. Rx sent.

## 2021-07-12 NOTE — Telephone Encounter (Signed)
Pt requested to speak directly with [facility], please advise.

## 2021-08-01 ENCOUNTER — Other Ambulatory Visit: Payer: Self-pay | Admitting: Family Medicine

## 2021-08-01 DIAGNOSIS — E1169 Type 2 diabetes mellitus with other specified complication: Secondary | ICD-10-CM

## 2021-08-02 NOTE — Telephone Encounter (Signed)
Requested Prescriptions  Pending Prescriptions Disp Refills  . TRULICITY 1.5 MG/0.5ML SOPN [Pharmacy Med Name: Trulicity 1.5 MG/0.5ML Subcutaneous Solution Pen-injector] 4 mL 0    Sig: INJECT 1.5MG  INTO THE SKIN ONCE A WEEK     Endocrinology:  Diabetes - GLP-1 Receptor Agonists Failed - 08/01/2021  3:01 PM      Failed - HBA1C is between 0 and 7.9 and within 180 days    Hemoglobin A1C  Date Value Ref Range Status  05/04/2021 10.9 (A) 4.0 - 5.6 % Final   HbA1c, POC (controlled diabetic range)  Date Value Ref Range Status  01/05/2021 8.6 (A) 0.0 - 7.0 % Final         Passed - Valid encounter within last 6 months    Recent Outpatient Visits          1 month ago Type 2 diabetes mellitus with other specified complication, with long-term current use of insulin (HCC)   [facility] [place], Jeannett Senior L, RPH-CPP   3 months ago Type 2 diabetes mellitus with other specified complication, with long-term current use of insulin [facility])   [facility] [place], [place], [place]   6 months ago Type 2 diabetes mellitus with other specified complication, with long-term current use of insulin (HCC)   [facility] [place], [place], MD   10 months ago Type 2 diabetes mellitus with other specified complication, with long-term current use of insulin (HCC)   [facility] [place], [place], MD   1 year ago Type 2 diabetes mellitus with other specified complication, with long-term current use of insulin [facility])   [facility] Hoy Register, MD      Future Appointments            In 1 week Hoy Register, MD [facility]   In 1 week Lois Huxley, Cornelius Moras, RPH-CPP [facility]

## 2021-08-09 ENCOUNTER — Ambulatory Visit: Payer: Medicaid - Out of State | Admitting: Family Medicine

## 2021-08-15 ENCOUNTER — Ambulatory Visit: Payer: Medicaid - Out of State | Admitting: Pharmacist

## 2021-09-07 ENCOUNTER — Ambulatory Visit: Payer: Medicaid - Out of State | Admitting: Physician Assistant

## 2021-09-27 ENCOUNTER — Ambulatory Visit: Payer: Medicaid - Out of State | Attending: Family Medicine | Admitting: Physician Assistant

## 2021-09-27 VITALS — BP 117/82 | HR 99 | Temp 98.5°F | Ht 73.0 in | Wt 268.0 lb

## 2021-09-27 DIAGNOSIS — E1169 Type 2 diabetes mellitus with other specified complication: Secondary | ICD-10-CM | POA: Diagnosis not present

## 2021-09-27 DIAGNOSIS — I152 Hypertension secondary to endocrine disorders: Secondary | ICD-10-CM

## 2021-09-27 DIAGNOSIS — E1159 Type 2 diabetes mellitus with other circulatory complications: Secondary | ICD-10-CM

## 2021-09-27 DIAGNOSIS — E785 Hyperlipidemia, unspecified: Secondary | ICD-10-CM

## 2021-09-27 DIAGNOSIS — Z794 Long term (current) use of insulin: Secondary | ICD-10-CM | POA: Diagnosis not present

## 2021-09-27 LAB — GLUCOSE, POCT (MANUAL RESULT ENTRY): POC Glucose: 168 mg/dl — AB (ref 70–99)

## 2021-09-27 LAB — POCT GLYCOSYLATED HEMOGLOBIN (HGB A1C): HbA1c POC (<> result, manual entry): 8.5 % (ref 4.0–5.6)

## 2021-09-27 MED ORDER — AMLODIPINE BESYLATE 10 MG PO TABS: 10.0000 mg | ORAL_TABLET | Freq: Every day | ORAL | 1 refills | Status: DC

## 2021-09-27 MED ORDER — TRUEPLUS PEN NEEDLES 32G X 4 MM MISC: 11 refills | Status: DC

## 2021-09-27 MED ORDER — EMPAGLIFLOZIN 25 MG PO TABS: 25.0000 mg | ORAL_TABLET | Freq: Every day | ORAL | 1 refills | Status: DC

## 2021-09-27 MED ORDER — TRULICITY 3 MG/0.5ML ~~LOC~~ SOAJ: 3.0000 mg | SUBCUTANEOUS | 5 refills | Status: DC

## 2021-09-27 MED ORDER — "INSULIN SYRINGE-NEEDLE U-100 31G X 5/16"" 0.3 ML MISC": 3 refills | Status: DC

## 2021-09-27 MED ORDER — ATORVASTATIN CALCIUM 20 MG PO TABS: 20.0000 mg | ORAL_TABLET | Freq: Every day | ORAL | 1 refills | Status: DC

## 2021-09-27 MED ORDER — LISINOPRIL-HYDROCHLOROTHIAZIDE 20-25 MG PO TABS: 1.0000 | ORAL_TABLET | Freq: Every day | ORAL | 1 refills | Status: DC

## 2021-09-27 MED ORDER — NOVOLIN 70/30 (70-30) 100 UNIT/ML ~~LOC~~ SUSP: 28.0000 [IU] | Freq: Two times a day (BID) | SUBCUTANEOUS | 6 refills | Status: DC

## 2021-09-27 NOTE — Progress Notes (Signed)
Patient ID: Jerry Arnold, male   DOB: Jun 23, 1985, 36 y.o.   MRN: [id number]   Jerry Arnold, is a 36 y.o. male  [id number]  [id number]  DOB - 1985-11-06  Chief Complaint  Patient presents with   Diabetes    Feels it is controlled well, eating healthy, exercise, taking meds Had YAG procedure to help with cataract 2 weeks ago Lipitor refill       Subjective:   Jerry Arnold is a 36 y.o. male here today for diabetes check.  Restarted trulicity in April.  He could not tolerate 38m.  He's been on 1.5 since the spring and tolerated the 368mpretty well in the past.  Blood sugars usu around 200s.  Compliant with meds  No problems updated.  ALLERGIES: No Known Allergies  PAST MEDICAL HISTORY: Past Medical History:  Diagnosis Date   Hypertension    Osteomyelitis ([place]02/21/2019   with "second toe on right foot" injury x 2-3 weeks (04/11/2017)   Type II diabetes mellitus ([facility]    MEDICATIONS AT HOME: Prior to Admission medications   Medication Sig Start Date End Date Taking? Authorizing Provider  Accu-Chek Softclix Lancets lancets Check blood sugar once daily. 06/12/21  Yes NeCharlott RakesMD  acetaminophen (TYLENOL) 500 MG tablet Take 1 tablet (500 mg total) by mouth every 6 (six) hours as needed. 09/07/18  Yes TrDomenic MorasPA-C  Blood Glucose Monitoring Suppl (ACCU-CHEK GUIDE) w/Device KIT Check blood sugar once daily. 06/12/21  Yes [place], EnCharlane FerrettiMD  Dulaglutide (TRULICITY) 3 MGTD/4.2AJOPN Inject 3 mg as directed once a week. 09/27/21  Yes McFreeman Caldron, PA-C  glucose blood (ACCU-CHEK GUIDE) test strip Check blood sugar once daily. 06/12/21  Yes NeCharlott RakesMD  amLODipine (NORVASC) 10 MG tablet Take 1 tablet (10 mg total) by mouth daily. 09/27/21   McArgentina DonovanPA-C  atorvastatin (LIPITOR) 20 MG tablet Take 1 tablet (20 mg total) by mouth daily. 09/27/21   McArgentina DonovanPA-C  empagliflozin (JARDIANCE) 25 MG TABS tablet Take 1 tablet (25 mg total) by mouth  daily before breakfast. 09/27/21   McThereasa SoloAnDionne BucyPA-C  insulin NPH-regular Human (NOVOLIN 70/30) (70-30) 100 UNIT/ML injection Inject 28 Units into the skin 2 (two) times daily with a meal. 09/27/21 09/27/22  Zenia Guest, AnDionne BucyPA-C  Insulin Pen Needle (TRUEPLUS PEN NEEDLES) 32G X 4 MM MISC Use as directed to inject Trulicity 8/07/27/09 Kimmerly Lora, AnDionne BucyPA-C  Insulin Syringe-Needle U-100 (TRUEPLUS INSULIN SYRINGE) 31G X 5/16" 0.3 ML MISC Use as directed to administer insulin twice daily 09/27/21   McArgentina DonovanPA-C  lisinopril-hydrochlorothiazide (ZESTORETIC) 20-25 MG tablet TAKE 1 TABLET BY MOUTH DAILY. 09/27/21 09/27/22  McArgentina DonovanPA-C  metoCLOPramide (REGLAN) 5 MG tablet Take 1 tablet (5 mg total) by mouth every 6 (six) hours as needed for nausea. Patient not taking: Reported on 11/27/2017 04/24/17 09/07/18  NoBrayton CavesPA-C    ROS: Neg HEENT Neg resp Neg cardiac Neg GI Neg GU Neg MS Neg psych Neg neuro  Objective:   Vitals:   09/27/21 1028  BP: 117/82  Pulse: 99  Temp: 98.5 F (36.9 C)  SpO2: 99%  Weight: 268 lb (121.6 kg)  Height: _0  (1.854 m)   Exam General appearance : Awake, alert, not in any distress. Speech Clear. Not toxic looking HEENT: Atraumatic and Normocephalic Neck: Supple, no JVD. No cervical lymphadenopathy.  Chest: Good air entry bilaterally, CTAB.  No rales/rhonchi/wheezing CVS: S1  S2 regular, no murmurs.  Extremities: B/L Lower Ext shows no edema, both legs are warm to touch Neurology: Awake alert, and oriented X 3, CN II-XII intact, Non focal Skin: No Rash  Data Review Lab Results  Component Value Date   HGBA1C 8.5 09/27/2021   HGBA1C 10.9 (A) 05/04/2021   HGBA1C 8.6 (A) 01/05/2021    Assessment & Plan   1. Type 2 diabetes mellitus with other specified complication, with long-term current use of insulin (HCC) Uncontrolled but very improved from previous A1C of 10.9.  increase trulicity to 3.0 weekly - Glucose (CBG) - HgB A1c -  Dulaglutide (TRULICITY) 3 VU/1.3HY SOPN; Inject 3 mg as directed once a week.  Dispense: 2 mL; Refill: 5 - Insulin Pen Needle (TRUEPLUS PEN NEEDLES) 32G X 4 MM MISC; Use as directed to inject Trulicity  Dispense: 388 each; Refill: 11 - insulin NPH-regular Human (NOVOLIN 70/30) (70-30) 100 UNIT/ML injection; Inject 28 Units into the skin 2 (two) times daily with a meal.  Dispense: 30 mL; Refill: 6 - empagliflozin (JARDIANCE) 25 MG TABS tablet; Take 1 tablet (25 mg total) by mouth daily before breakfast.  Dispense: 90 tablet; Refill: 1 - Insulin Syringe-Needle U-100 (TRUEPLUS INSULIN SYRINGE) 31G X 5/16" 0.3 ML MISC; Use as directed to administer insulin twice daily  Dispense: 100 each; Refill: 3  2. Hyperlipidemia associated with type 2 diabetes mellitus (HCC) - atorvastatin (LIPITOR) 20 MG tablet; Take 1 tablet (20 mg total) by mouth daily.  Dispense: 90 tablet; Refill: 1  3. Hypertension associated with diabetes ([place]) controlled - lisinopril-hydrochlorothiazide (ZESTORETIC) 20-25 MG tablet; TAKE 1 TABLET BY MOUTH DAILY.  Dispense: 90 tablet; Refill: 1 - amLODipine (NORVASC) 10 MG tablet; Take 1 tablet (10 mg total) by mouth daily.  Dispense: 90 tablet; Refill: 1    Return in about 3 months (around 12/28/2021) for PCP for chronic conditions.  The patient was given clear instructions to go to ER or return to medical center if symptoms don't improve, worsen or new problems develop. The patient verbalized understanding. The patient was told to call to get lab results if they haven't heard anything in the next week.      Freeman Caldron, PA-C [facility] [place], [place]   09/27/2021, 10:53 AM

## 2021-09-27 NOTE — Patient Instructions (Signed)
Drink 80-100 ounces water daily 

## 2021-10-06 ENCOUNTER — Other Ambulatory Visit: Payer: Self-pay

## 2021-10-06 ENCOUNTER — Emergency Department (HOSPITAL_COMMUNITY): Payer: Medicaid - Out of State

## 2021-10-06 ENCOUNTER — Emergency Department (HOSPITAL_COMMUNITY)
Admission: EM | Admit: 2021-10-06 | Discharge: 2021-10-06 | Disposition: A | Discharge status: Home / Self Care | Payer: Medicaid - Out of State | Attending: Emergency Medicine | Admitting: Emergency Medicine

## 2021-10-06 DIAGNOSIS — M25561 Pain in right knee: Secondary | ICD-10-CM | POA: Insufficient documentation

## 2021-10-06 DIAGNOSIS — Z7984 Long term (current) use of oral hypoglycemic drugs: Secondary | ICD-10-CM | POA: Insufficient documentation

## 2021-10-06 DIAGNOSIS — E119 Type 2 diabetes mellitus without complications: Secondary | ICD-10-CM | POA: Insufficient documentation

## 2021-10-06 DIAGNOSIS — Z79899 Other long term (current) drug therapy: Secondary | ICD-10-CM | POA: Insufficient documentation

## 2021-10-06 DIAGNOSIS — I10 Essential (primary) hypertension: Secondary | ICD-10-CM | POA: Insufficient documentation

## 2021-10-06 DIAGNOSIS — Z794 Long term (current) use of insulin: Secondary | ICD-10-CM | POA: Insufficient documentation

## 2021-10-06 MED ORDER — PREDNISONE 10 MG PO TABS
20.0000 mg | ORAL_TABLET | Freq: Every day | ORAL | 0 refills | Status: DC
  Filled 2021-10-06: qty 10, 5d supply, fill #0

## 2021-10-06 MED ORDER — PREDNISONE 10 MG PO TABS: 20.0000 mg | ORAL_TABLET | Freq: Every day | ORAL | 0 refills | Status: AC

## 2021-10-06 MED ORDER — CYCLOBENZAPRINE HCL 10 MG PO TABS: 10.0000 mg | ORAL_TABLET | Freq: Two times a day (BID) | ORAL | 0 refills | Status: AC | PRN

## 2021-10-06 MED ORDER — CYCLOBENZAPRINE HCL 10 MG PO TABS
10.0000 mg | ORAL_TABLET | Freq: Two times a day (BID) | ORAL | 0 refills | Status: DC | PRN
  Filled 2021-10-06: qty 20, 10d supply, fill #0

## 2021-10-06 NOTE — ED Provider Notes (Signed)
[facility] DEPT Provider Note   CSN: [id number] Arrival date & time: 10/06/21  0229     History  Chief Complaint  Patient presents with   Knee Pain    Jerry Arnold is a 36 y.o. male.  HPI   Patient with medical history including diabetes, hypertension presents  with complaints of right knee pain, knee pain started yesterday, came on suddenly, is on the medial aspect of his knee, does not radiate, is worse with movement previous rest, denies any trauma to the area, he has never had this in the past, no history of autoimmune diseases, gout, pseudogout, denies history of IV drug use.  He has been taking Tylenol without much relief.  Home Medications Prior to Admission medications   Medication Sig Start Date End Date Taking? Authorizing Provider  cyclobenzaprine (FLEXERIL) 10 MG tablet Take 1 tablet (10 mg total) by mouth 2 (two) times daily as needed for muscle spasms. 10/06/21  Yes Marcello Fennel, PA-C  predniSONE (DELTASONE) 10 MG tablet Take 2 tablets (20 mg total) by mouth daily for 5 days. 10/06/21 10/11/21 Yes Marcello Fennel, PA-C  Accu-Chek Softclix Lancets lancets Check blood sugar once daily. 06/12/21   Charlott Rakes, MD  acetaminophen (TYLENOL) 500 MG tablet Take 1 tablet (500 mg total) by mouth every 6 (six) hours as needed. 09/07/18   Domenic Moras, PA-C  amLODipine (NORVASC) 10 MG tablet Take 1 tablet (10 mg total) by mouth daily. 09/27/21   Argentina Donovan, PA-C  atorvastatin (LIPITOR) 20 MG tablet Take 1 tablet (20 mg total) by mouth daily. 09/27/21   Argentina Donovan, PA-C  Blood Glucose Monitoring Suppl (ACCU-CHEK GUIDE) w/Device KIT Check blood sugar once daily. 06/12/21   Charlott Rakes, MD  Dulaglutide (TRULICITY) 3 FB/5.1WC SOPN Inject 3 mg as directed once a week. 09/27/21   Argentina Donovan, PA-C  empagliflozin (JARDIANCE) 25 MG TABS tablet Take 1 tablet (25 mg total) by mouth daily before breakfast. 09/27/21   Argentina Donovan,  PA-C  glucose blood (ACCU-CHEK GUIDE) test strip Check blood sugar once daily. 06/12/21   Charlott Rakes, MD  insulin NPH-regular Human (NOVOLIN 70/30) (70-30) 100 UNIT/ML injection Inject 28 Units into the skin 2 (two) times daily with a meal. 09/27/21 09/27/22  McClung, Dionne Bucy, PA-C  Insulin Pen Needle (TRUEPLUS PEN NEEDLES) 32G X 4 MM MISC Use as directed to inject Trulicity 06/27/50   McClung, Dionne Bucy, PA-C  Insulin Syringe-Needle U-100 (TRUEPLUS INSULIN SYRINGE) 31G X 5/16" 0.3 ML MISC Use as directed to administer insulin twice daily 09/27/21   Argentina Donovan, PA-C  lisinopril-hydrochlorothiazide (ZESTORETIC) 20-25 MG tablet TAKE 1 TABLET BY MOUTH DAILY. 09/27/21 09/27/22  Argentina Donovan, PA-C  metoCLOPramide (REGLAN) 5 MG tablet Take 1 tablet (5 mg total) by mouth every 6 (six) hours as needed for nausea. Patient not taking: Reported on 11/27/2017 04/24/17 09/07/18  Brayton Caves, PA-C      Allergies    Patient has no known allergies.    Review of Systems   Review of Systems  Constitutional:  Negative for chills and fever.  Respiratory:  Negative for shortness of breath.   Cardiovascular:  Negative for chest pain.  Gastrointestinal:  Negative for abdominal pain.  Musculoskeletal:        Right knee pain  Neurological:  Negative for headaches.    Physical Exam Updated Vital Signs BP 132/77 (BP Location: Right Arm)   Pulse (!) 106   Temp  98.4 F (36.9 C) (Oral)   Resp 18   SpO2 96%  Physical Exam Vitals and nursing note reviewed.  Constitutional:      General: He is not in acute distress.    Appearance: He is not ill-appearing.  HENT:     Head: Normocephalic and atraumatic.     Nose: No congestion.  Eyes:     Conjunctiva/sclera: Conjunctivae normal.  Cardiovascular:     Rate and Rhythm: Normal rate and regular rhythm.     Pulses: Normal pulses.  Pulmonary:     Effort: Pulmonary effort is normal.  Musculoskeletal:     Comments: Focused exam of the right leg was performed  no erythema no edema no deformities present, he has full range of motion at his knee ankle and toes, 2+ dorsal pedal pulses neurovascular fully intact, he has point tenderness on the medial aspect of his tibial plateau no crepitus deformities noted, no joint laxity present.  Calf is nontender, no palpable cords.  Skin:    General: Skin is warm and dry.  Neurological:     Mental Status: He is alert.  Psychiatric:        Mood and Affect: Mood normal.     ED Results / Procedures / Treatments   Labs (all labs ordered are listed, but only abnormal results are displayed) Labs Reviewed - No data to display  EKG None  Radiology DG Knee 1-2 Views Right  Result Date: 10/06/2021 CLINICAL DATA:  Knee pain and swelling for 2 days, initial encounter EXAM: RIGHT KNEE - 2 VIEW COMPARISON:  None Available. FINDINGS: No evidence of fracture, dislocation, or joint effusion. No evidence of arthropathy or other focal bone abnormality. Soft tissues are unremarkable. IMPRESSION: No acute abnormality noted. Electronically Signed   By: [name] M.D.   On: 10/06/2021 03:40    Procedures Procedures    Medications Ordered in ED Medications - No data to display  ED Course/ Medical Decision Making/ A&P                           Medical Decision Making Amount and/or Complexity of Data Reviewed Radiology: ordered.   This patient presents to the ED for concern of knee pain, this involves an extensive number of treatment options, and is a complaint that carries with it a high risk of complications and morbidity.  The differential diagnosis includes fracture, dislocation, compartment syndrome, septic joint    Additional history obtained:  Additional history obtained from N/A External records from outside source obtained and reviewed including N/A   Co morbidities that complicate the patient evaluation  Diabetes  Social Determinants of Health:  N/A    Lab Tests:  I Ordered, and personally  interpreted labs.  The pertinent results include: N/A   Imaging Studies ordered:  I ordered imaging studies including DG knee I independently visualized and interpreted imaging which showed negative for acute findings I agree with the radiologist interpretation   Cardiac Monitoring:  The patient was maintained on a cardiac monitor.  I personally viewed and interpreted the cardiac monitored which showed an underlying rhythm of: N/A   Medicines ordered and prescription drug management:  I ordered medication including N/A I have reviewed the patients home medicines and have made adjustments as needed  Critical Interventions:  N/A   Reevaluation:  Presents with knee pain triage obtained imaging which were unremarkable, he had a benign physical exam, he is agreement plan discharge at  this time.  Consultations Obtained:  N/A   Test Considered:  N/A    Rule out I have low suspicion for septic arthritis as patient denies IV drug use, skin exam was performed no erythematous, edematous, warm joints noted on exam, no new heart murmur heard on exam.  Low suspicion for fracture or dislocation as x-ray does not feel any significant findings. low suspicion for ligament or tendon damage as area was palpated no gross defects noted, they had full range of motion as well as 5/5 strength.  Low suspicion for compartment syndrome as area was palpated it was soft to the touch, neurovascular fully intact.  Low suspicion for DVT as presentation atypical, pain is located on the medial aspect of his tibial plateau he has no calf tenderness or palpable cords.     Dispostion and problem list  After consideration of the diagnostic results and the patients response to treatment, I feel that the patent would benefit from discharge.  Knee pain-suspect muscular, will recommend continued over-the-counter pain medication, given short course of steroids, follow-up with orthopedics for further evaluation  and strict return precautions.            Final Clinical Impression(s) / ED Diagnoses Final diagnoses:  Acute pain of right knee    Rx / DC Orders ED Discharge Orders          Ordered    predniSONE (DELTASONE) 10 MG tablet  Daily        10/06/21 0426    cyclobenzaprine (FLEXERIL) 10 MG tablet  2 times daily PRN        10/06/21 0426              Marcello Fennel, PA-C 10/06/21 0427    Palumbo, April, MD 10/06/21 [phone]

## 2021-10-06 NOTE — Discharge Instructions (Signed)
Imaging was reassuring, I have started you on steroids as well as a muscle relaxer please take as prescribed.  You may also use Tylenol, recommend placing ice to the area help with decrease inflammation.  Please follow-up with orthopedics if symptoms not improved after 5 days time.  Come back to the emergency department if you develop chest pain, shortness of breath, severe abdominal pain, uncontrolled nausea, vomiting, diarrhea.

## 2021-10-06 NOTE — ED Notes (Signed)
I provided reinforced discharge education based off of after visit summary/care provided. Pt acknowledged and understood my education. Pt had no further questions/concerns for provider/myself. After visit summary provided to pt. 

## 2021-10-06 NOTE — ED Triage Notes (Signed)
Patient coming to ED for evaluation of pain to R knee.  No reports of injury.  No recent falls.  Swelling noted to medial aspect

## 2022-01-02 ENCOUNTER — Other Ambulatory Visit: Payer: Self-pay

## 2022-01-02 ENCOUNTER — Ambulatory Visit: Payer: Medicaid - Out of State | Attending: Family Medicine | Admitting: Family Medicine

## 2022-01-02 ENCOUNTER — Encounter: Payer: Self-pay | Admitting: Family Medicine

## 2022-01-02 VITALS — BP 108/74 | HR 90 | Temp 98.7°F | Ht 73.0 in | Wt 271.8 lb

## 2022-01-02 DIAGNOSIS — N1831 Chronic kidney disease, stage 3a: Secondary | ICD-10-CM

## 2022-01-02 DIAGNOSIS — I129 Hypertensive chronic kidney disease with stage 1 through stage 4 chronic kidney disease, or unspecified chronic kidney disease: Secondary | ICD-10-CM

## 2022-01-02 DIAGNOSIS — E1169 Type 2 diabetes mellitus with other specified complication: Secondary | ICD-10-CM | POA: Diagnosis not present

## 2022-01-02 DIAGNOSIS — E785 Hyperlipidemia, unspecified: Secondary | ICD-10-CM

## 2022-01-02 DIAGNOSIS — Z23 Encounter for immunization: Secondary | ICD-10-CM

## 2022-01-02 DIAGNOSIS — Z794 Long term (current) use of insulin: Secondary | ICD-10-CM | POA: Diagnosis not present

## 2022-01-02 DIAGNOSIS — E1159 Type 2 diabetes mellitus with other circulatory complications: Secondary | ICD-10-CM | POA: Diagnosis not present

## 2022-01-02 DIAGNOSIS — I152 Hypertension secondary to endocrine disorders: Secondary | ICD-10-CM

## 2022-01-02 DIAGNOSIS — E1122 Type 2 diabetes mellitus with diabetic chronic kidney disease: Secondary | ICD-10-CM | POA: Diagnosis not present

## 2022-01-02 LAB — POCT GLYCOSYLATED HEMOGLOBIN (HGB A1C): HbA1c, POC (controlled diabetic range): 9 % — AB (ref 0.0–7.0)

## 2022-01-02 LAB — GLUCOSE, POCT (MANUAL RESULT ENTRY): POC Glucose: 111 mg/dl — AB (ref 70–99)

## 2022-01-02 MED ORDER — TRULICITY 0.75 MG/0.5ML ~~LOC~~ SOAJ: 0.7500 mg | SUBCUTANEOUS | 0 refills | Status: DC

## 2022-01-02 MED ORDER — TRULICITY 1.5 MG/0.5ML ~~LOC~~ SOAJ
1.5000 mg | SUBCUTANEOUS | 6 refills | Status: DC
  Filled 2022-01-02: qty 2, 28d supply, fill #0

## 2022-01-02 MED ORDER — TRULICITY 0.75 MG/0.5ML ~~LOC~~ SOAJ
0.7500 mg | SUBCUTANEOUS | 0 refills | Status: DC
  Filled 2022-01-02: qty 2, 28d supply, fill #0

## 2022-01-02 MED ORDER — NOVOLIN 70/30 (70-30) 100 UNIT/ML ~~LOC~~ SUSP: 32.0000 [IU] | Freq: Two times a day (BID) | SUBCUTANEOUS | 6 refills | Status: DC

## 2022-01-02 MED ORDER — TRULICITY 1.5 MG/0.5ML ~~LOC~~ SOAJ: 1.5000 mg | SUBCUTANEOUS | 6 refills | Status: DC

## 2022-01-02 MED ORDER — NOVOLIN 70/30 (70-30) 100 UNIT/ML ~~LOC~~ SUSP
32.0000 [IU] | Freq: Two times a day (BID) | SUBCUTANEOUS | 6 refills | Status: DC
  Filled 2022-01-02: qty 30, 47d supply, fill #0

## 2022-01-02 NOTE — Patient Instructions (Signed)

## 2022-01-02 NOTE — Progress Notes (Signed)
Discuss trulicity medication. °

## 2022-01-02 NOTE — Progress Notes (Signed)
Subjective:  Patient ID: Jerry Arnold, male    DOB: 10-23-1985  Age: 36 y.o. MRN: [id number]  CC: Diabetes   HPI Brendon Christoffel is a 36 y.o. year old male with a history of Type 2 DM (A1c 9.0), HTN, hyperlipidemia, status post right second toe amputation in 03/2017 seen for follow-up visit   Interval History:  He Complains of Trulicity makes him so nauseous at a higher dose and would be unable to keep anything down over the days following administration. At a lower dose of 1.5 he did okay with no nausea and he stopped Trulicity for 1 month. Fasting sugars have been 115 -130 and random sugars have been under 180. A1c is 9.0 up from 8.6 previously.  He has had no hypoglycemic episodes, no neuropathy symptoms or visual concerns. He had an Ophthalmology appointment in [place] in 08/2021 and he had the Yag procedure then.  Follow-up appointment comes up after Thanksgiving. He walks around his apartment complex once or twice/day which takes about 30 minutes.   Endorses adherence with his antihypertensive and statin. Denies additional concerns. Past Medical History:  Diagnosis Date   Hypertension    Osteomyelitis ([place]) 04/11/2017   with "second toe on right foot" injury x 2-3 weeks (04/11/2017)   Type II diabetes mellitus ([place])     Past Surgical History:  Procedure Laterality Date   AMPUTATION Right 04/13/2017   Procedure: SECOND FOOT RAY AMPUTATION;  Surgeon: Newt Minion, MD;  Location: [facility];  Service: Orthopedics;  Laterality: Right;   NO PAST SURGERIES      Family History  Problem Relation Age of Onset   Hypertension Mother    Diabetes Mother    Hypertension Maternal Grandmother    Hypertension Paternal Grandmother     Social History   Socioeconomic History   Marital status: Single    Spouse name: Not on file   Number of children: Not on file   Years of education: Not on file   Highest education level: Not on file  Occupational History   Not on file  Tobacco  Use   Smoking status: Never   Smokeless tobacco: Never  Vaping Use   Vaping Use: Never used  Substance and Sexual Activity   Alcohol use: Yes    Comment: 04/11/2017 "might have a few drinks/year"   Drug use: No   Sexual activity: Yes  Other Topics Concern   Not on file  Social History Narrative   Not on file   Social Determinants of Health   Financial Resource Strain: Not on file  Food Insecurity: Not on file  Transportation Needs: Not on file  Physical Activity: Not on file  Stress: Not on file  Social Connections: Not on file    No Known Allergies  Outpatient Medications Prior to Visit  Medication Sig Dispense Refill   Accu-Chek Softclix Lancets lancets Check blood sugar once daily. 100 each 2   acetaminophen (TYLENOL) 500 MG tablet Take 1 tablet (500 mg total) by mouth every 6 (six) hours as needed. 30 tablet 0   amLODipine (NORVASC) 10 MG tablet Take 1 tablet (10 mg total) by mouth daily. 90 tablet 1   atorvastatin (LIPITOR) 20 MG tablet Take 1 tablet (20 mg total) by mouth daily. 90 tablet 1   Blood Glucose Monitoring Suppl (ACCU-CHEK GUIDE) w/Device KIT Check blood sugar once daily. 1 kit 0   cyclobenzaprine (FLEXERIL) 10 MG tablet Take 1 tablet (10 mg total) by mouth 2 (two) times  daily as needed for muscle spasms. 20 tablet 0   empagliflozin (JARDIANCE) 25 MG TABS tablet Take 1 tablet (25 mg total) by mouth daily before breakfast. 90 tablet 1   glucose blood (ACCU-CHEK GUIDE) test strip Check blood sugar once daily. 100 each 2   Insulin Pen Needle (TRUEPLUS PEN NEEDLES) 32G X 4 MM MISC Use as directed to inject Trulicity 546 each 11   Insulin Syringe-Needle U-100 (TRUEPLUS INSULIN SYRINGE) 31G X 5/16" 0.3 ML MISC Use as directed to administer insulin twice daily 100 each 3   lisinopril-hydrochlorothiazide (ZESTORETIC) 20-25 MG tablet TAKE 1 TABLET BY MOUTH DAILY. 90 tablet 1   insulin NPH-regular Human (NOVOLIN 70/30) (70-30) 100 UNIT/ML injection Inject 28 Units into  the skin 2 (two) times daily with a meal. 30 mL 6   Dulaglutide (TRULICITY) 3 TK/3.5WS SOPN Inject 3 mg as directed once a week. (Patient not taking: Reported on 01/02/2022) 2 mL 5   No facility-administered medications prior to visit.     ROS Review of Systems  Constitutional:  Negative for activity change and appetite change.  HENT:  Negative for sinus pressure and sore throat.   Respiratory:  Negative for chest tightness, shortness of breath and wheezing.   Cardiovascular:  Negative for chest pain and palpitations.  Gastrointestinal:  Negative for abdominal distention, abdominal pain and constipation.  Genitourinary: Negative.   Musculoskeletal: Negative.   Psychiatric/Behavioral:  Negative for behavioral problems and dysphoric mood.     Objective:  BP 108/74   Pulse 90   Temp 98.7 F (37.1 C) (Oral)   Ht _0  (1.854 m)   Wt 271 lb 12.8 oz (123.3 kg)   SpO2 99%   BMI 35.86 kg/m      01/02/2022   10:25 AM 10/06/2021    2:54 AM 09/27/2021   10:28 AM  BP/Weight  Systolic BP 568 127 517  Diastolic BP 74 77 82  Wt. (Lbs) 271.8  268  BMI 35.86 kg/m2  35.36 kg/m2      Physical Exam Constitutional:      Appearance: He is well-developed. He is obese.  Cardiovascular:     Rate and Rhythm: Normal rate.     Heart sounds: Normal heart sounds. No murmur heard. Pulmonary:     Effort: Pulmonary effort is normal.     Breath sounds: Normal breath sounds. No wheezing or rales.  Chest:     Chest wall: No tenderness.  Abdominal:     General: Bowel sounds are normal. There is no distension.     Palpations: Abdomen is soft. There is no mass.     Tenderness: There is no abdominal tenderness.  Musculoskeletal:        General: Normal range of motion.     Right lower leg: No edema.     Left lower leg: No edema.  Neurological:     Mental Status: He is alert and oriented to person, place, and time.  Psychiatric:        Mood and Affect: Mood normal.        Latest Ref Rng &  Units 06/12/2021    9:15 AM 05/04/2021    1:54 PM 02/27/2021   11:07 PM  CMP  Glucose 70 - 99 mg/dL 173  344  155   BUN 6 - 20 mg/dL 24  32  35   Creatinine 0.76 - 1.27 mg/dL 1.61  2.23  2.02   Sodium 134 - 144 mmol/L 141  134  135  Potassium 3.5 - 5.2 mmol/L 4.9  4.8  5.0   Chloride 96 - 106 mmol/L 103  101  106   CO2 20 - 29 mmol/L _0 Calcium 8.7 - 10.2 mg/dL 10.0  8.6  8.9   Total Protein 6.0 - 8.5 g/dL 7.7  7.9    Total Bilirubin 0.0 - 1.2 mg/dL 0.3  0.4    Alkaline Phos 44 - 121 IU/L 130  118    AST 0 - 40 IU/L 26  17    ALT 0 - 44 IU/L 43  21      Lipid Panel     Component Value Date/Time   CHOL 165 06/12/2021 0915   TRIG 95 06/12/2021 0915   HDL 54 06/12/2021 0915   CHOLHDL 3.1 06/12/2021 0915   CHOLHDL 3.8 04/12/2017 0036   VLDL 18 04/12/2017 0036   LDLCALC 94 06/12/2021 0915    CBC    Component Value Date/Time   WBC 5.6 06/12/2021 0915   WBC 6.7 05/04/2021 1354   RBC 5.27 06/12/2021 0915   RBC 4.76 05/04/2021 1354   HGB 14.8 06/12/2021 0915   HCT 45.1 06/12/2021 0915   PLT 241 06/12/2021 0915   MCV 86 06/12/2021 0915   MCH 28.1 06/12/2021 0915   MCH 28.6 05/04/2021 1354   MCHC 32.8 06/12/2021 0915   MCHC 32.5 05/04/2021 1354   RDW 14.2 06/12/2021 0915   LYMPHSABS 2.7 06/12/2021 0915   MONOABS 0.8 05/04/2021 1354   EOSABS 0.3 06/12/2021 0915   BASOSABS 0.1 06/12/2021 0915    Lab Results  Component Value Date   HGBA1C 9.0 (A) 01/02/2022    Assessment & Plan:  1. Type 2 diabetes mellitus with other specified complication, with long-term current use of insulin (HCC) Uncontrolled with A1c of 9.0; goal is less than 7.0 He has been unable to tolerate high doses of Trulicity hence I will cut back on his dose. Due to the fact that he has been without his GLP-1 RA for 1 month we will need to start with the lowest dose and titrate up to 1.5 mg to prevent GI side effects. Increased dose of Novolin 70/30 from 28 units twice daily to 32 units twice  daily.  Advised that if hypoglycemia occurs when he uptitrate his Trulicity dose he can decrease Novolin 70/30 x 2 units. - POCT glucose (manual entry) - POCT glycosylated hemoglobin (Hb A1C) - Microalbumin/Creatinine Ratio, Urine - Dulaglutide (TRULICITY) 0.25 EN/2.7PO SOPN; Inject 0.75 mg into the skin once a week. For 4 weeks then increase to 1.5 mg thereafter  Dispense: 2 mL; Refill: 0 - Dulaglutide (TRULICITY) 1.5 EU/2.3NT SOPN; Inject 1.5 mg into the skin once a week.  Dispense: 2 mL; Refill: 6 - insulin NPH-regular Human (NOVOLIN 70/30) (70-30) 100 UNIT/ML injection; Inject 32 Units into the skin 2 (two) times daily with a meal.  Dispense: 30 mL; Refill: 6 - CMP14+EGFR  2. Hyperlipidemia associated with type 2 diabetes mellitus (HCC) Controlled Continue Lipitor Low-cholesterol diet  3. Hypertension associated with diabetes ([place]) Controlled Continue lisinopril/HCTZ, amlodipine Counseled on blood pressure goal of less than 130/80, low-sodium, DASH diet, medication compliance, 150 minutes of moderate intensity exercise per week. Discussed medication compliance, adverse effects.  4.  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 For 4 weeks then increase to 1.5 mg thereafter    Dispense:  2 mL    Refill:  0    Discontinue Trulicity 3 mg   Dulaglutide (TRULICITY) 1.5 IX/1.8ZB SOPN    Sig: Inject 1.5 mg into the skin once a week.    Dispense:  2 mL    Refill:  6   insulin NPH-regular Human (NOVOLIN 70/30) (70-30) 100 UNIT/ML injection    Sig: Inject 32 Units into the skin 2 (two) times daily with a meal.    Dispense:  30 mL    Refill:  6    Dose increase     Follow-up: Return in about 3 months (around 04/04/2022) for Chronic medical conditions.       Charlott Rakes, MD, FAAFP. [facility] [place], [place]   01/02/2022, 10:52 AM

## 2022-01-02 NOTE — Addendum Note (Signed)
Addended by: Ronette Deter on: 01/02/2022 10:59 AM   Modules accepted: Orders

## 2022-01-03 LAB — CMP14+EGFR
ALT: 18 IU/L (ref 0–44)
AST: 17 IU/L (ref 0–40)
Albumin/Globulin Ratio: 1.2 (ref 1.2–2.2)
Albumin: 4.6 g/dL (ref 4.1–5.1)
Alkaline Phosphatase: 138 IU/L — ABNORMAL HIGH (ref 44–121)
BUN/Creatinine Ratio: 18 (ref 9–20)
BUN: 38 mg/dL — ABNORMAL HIGH (ref 6–20)
Bilirubin Total: 0.5 mg/dL (ref 0.0–1.2)
CO2: 20 mmol/L (ref 20–29)
Calcium: 9.4 mg/dL (ref 8.7–10.2)
Chloride: 99 mmol/L (ref 96–106)
Creatinine, Ser: 2.1 mg/dL — ABNORMAL HIGH (ref 0.76–1.27)
Globulin, Total: 3.8 g/dL (ref 1.5–4.5)
Glucose: 105 mg/dL — ABNORMAL HIGH (ref 70–99)
Potassium: 4.7 mmol/L (ref 3.5–5.2)
Sodium: 137 mmol/L (ref 134–144)
Total Protein: 8.4 g/dL (ref 6.0–8.5)
eGFR: 41 mL/min/{1.73_m2} — ABNORMAL LOW (ref >59)

## 2022-01-03 LAB — MICROALBUMIN / CREATININE URINE RATIO
Creatinine, Urine: 124.3 mg/dL
Microalb/Creat Ratio: 40 mg/g creat — ABNORMAL HIGH (ref 0–29)
Microalbumin, Urine: 49.6 ug/mL

## 2022-03-19 ENCOUNTER — Other Ambulatory Visit: Payer: Self-pay | Admitting: Physician Assistant

## 2022-03-19 DIAGNOSIS — E1169 Type 2 diabetes mellitus with other specified complication: Secondary | ICD-10-CM

## 2022-03-20 NOTE — Telephone Encounter (Signed)
Requested Prescriptions  Pending Prescriptions Disp Refills   atorvastatin (LIPITOR) 20 MG tablet [Pharmacy Med Name: Atorvastatin Calcium 20 MG Oral Tablet] 90 tablet 0    Sig: Take 1 tablet by mouth once daily     Cardiovascular:  Antilipid - Statins Failed - 03/19/2022 11:26 AM      Failed - Lipid Panel in normal range within the last 12 months    Cholesterol, Total  Date Value Ref Range Status  06/12/2021 165 100 - 199 mg/dL Final   LDL Chol Calc (NIH)  Date Value Ref Range Status  06/12/2021 94 0 - 99 mg/dL Final   HDL  Date Value Ref Range Status  06/12/2021 54 >39 mg/dL Final   Triglycerides  Date Value Ref Range Status  06/12/2021 95 0 - 149 mg/dL Final         Passed - Patient is not pregnant      Passed - Valid encounter within last 12 months    Recent Outpatient Visits           2 months ago Type 2 diabetes mellitus with other specified complication, with long-term current use of insulin ([place])   [place], [place], MD   5 months ago Type 2 diabetes mellitus with other specified complication, with long-term current use of insulin [facility])   [place], [place], [place]   9 months ago Type 2 diabetes mellitus with other specified complication, with long-term current use of insulin [facility])   [place], Child[place] L, RPH-CPP   10 months ago Type 2 diabetes mellitus with other specified complication, with long-term current use of insulin [facility])   [place], [place], [place]   1 year ago Type 2 diabetes mellitus with other specified complication, with long-term current use of insulin [facility])   [place] Charlott Rakes, MD       Future Appointments             In 3 weeks [place], [place]

## 2022-04-06 ENCOUNTER — Other Ambulatory Visit: Payer: Self-pay

## 2022-04-11 ENCOUNTER — Encounter: Payer: Self-pay | Admitting: Physician Assistant

## 2022-04-11 ENCOUNTER — Ambulatory Visit: Payer: Medicaid - Out of State | Attending: Family Medicine | Admitting: Physician Assistant

## 2022-04-11 VITALS — BP 137/67 | HR 93 | Wt 261.8 lb

## 2022-04-11 DIAGNOSIS — I152 Hypertension secondary to endocrine disorders: Secondary | ICD-10-CM | POA: Diagnosis not present

## 2022-04-11 DIAGNOSIS — E1159 Type 2 diabetes mellitus with other circulatory complications: Secondary | ICD-10-CM | POA: Diagnosis not present

## 2022-04-11 DIAGNOSIS — Z794 Long term (current) use of insulin: Secondary | ICD-10-CM

## 2022-04-11 DIAGNOSIS — E1169 Type 2 diabetes mellitus with other specified complication: Secondary | ICD-10-CM | POA: Diagnosis not present

## 2022-04-11 DIAGNOSIS — E785 Hyperlipidemia, unspecified: Secondary | ICD-10-CM

## 2022-04-11 LAB — POCT GLYCOSYLATED HEMOGLOBIN (HGB A1C): HbA1c, POC (controlled diabetic range): 9.9 % — AB (ref 0.0–7.0)

## 2022-04-11 LAB — GLUCOSE, POCT (MANUAL RESULT ENTRY): POC Glucose: 147 mg/dl — AB (ref 70–99)

## 2022-04-11 MED ORDER — TIRZEPATIDE 2.5 MG/0.5ML ~~LOC~~ SOAJ
2.5000 mg | SUBCUTANEOUS | 0 refills | Status: DC
Start: 2022-04-11 — End: 2022-05-10

## 2022-04-11 MED ORDER — ATORVASTATIN CALCIUM 20 MG PO TABS: 20.0000 mg | ORAL_TABLET | Freq: Every day | ORAL | 1 refills | Status: DC

## 2022-04-11 MED ORDER — EMPAGLIFLOZIN 25 MG PO TABS: 25.0000 mg | ORAL_TABLET | Freq: Every day | ORAL | 1 refills | Status: DC

## 2022-04-11 MED ORDER — AMLODIPINE BESYLATE 10 MG PO TABS: 10.0000 mg | ORAL_TABLET | Freq: Every day | ORAL | 1 refills | Status: DC

## 2022-04-11 MED ORDER — TIRZEPATIDE 5 MG/0.5ML ~~LOC~~ SOAJ: 5.0000 mg | SUBCUTANEOUS | 2 refills | Status: DC

## 2022-04-11 MED ORDER — LISINOPRIL-HYDROCHLOROTHIAZIDE 20-25 MG PO TABS: 1.0000 | ORAL_TABLET | Freq: Every day | ORAL | 1 refills | Status: DC

## 2022-04-11 MED ORDER — NOVOLIN 70/30 (70-30) 100 UNIT/ML ~~LOC~~ SUSP: 32.0000 [IU] | Freq: Two times a day (BID) | SUBCUTANEOUS | 6 refills | Status: DC

## 2022-04-11 MED ORDER — "INSULIN SYRINGE-NEEDLE U-100 31G X 5/16"" 0.3 ML MISC": 3 refills | Status: DC

## 2022-04-11 NOTE — Progress Notes (Signed)
Patient ID: Jerry Arnold, male   DOB: 04/23/1985, 37 y.o.   MRN: [id number]   Jerry Arnold, is a 37 y.o. male  [id number]  [id number]  DOB - [birth year]  Chief Complaint  Patient presents with   Diabetes   Medication Refill   Med Change Request       Subjective:   Jerry Arnold is a 37 y.o. male here today for med RF.  He has no longer been able to tolerate trulicity.  He tried decreasing the dose dose but was still unable to tolerate it and it caused him N/V then dry heaves.  He says he wants to try something different.  He has been taking 30u of 70/30 daily.  No new issues or concerns.  Denies hypoglycemia.    No problems updated.  ALLERGIES: No Known Allergies  PAST MEDICAL HISTORY: Past Medical History:  Diagnosis Date   Hypertension    Osteomyelitis ([place]) 04/11/2017   with "second toe on right foot" injury x 2-3 weeks (04/11/2017)   Type II diabetes mellitus ([place])     MEDICATIONS AT HOME: Prior to Admission medications   Medication Sig Start Date End Date Taking? Authorizing Provider  amLODipine (NORVASC) 10 MG tablet Take 1 tablet (10 mg total) by mouth daily. 09/27/21  Yes Lorik Guo, Dionne Bucy, PA-C  tirzepatide [facility]) 2.5 MG/0.5ML Pen Inject 2.5 mg into the skin once a week. X 1 month. 04/11/22  Yes Shyla Gayheart, Dionne Bucy, PA-C  tirzepatide [facility]) 5 MG/0.5ML Pen Inject 5 mg into the skin once a week. 05/10/22  Yes Argentina Donovan, PA-C  Accu-Chek Softclix Lancets lancets Check blood sugar once daily. 06/12/21   Charlott Rakes, MD  acetaminophen (TYLENOL) 500 MG tablet Take 1 tablet (500 mg total) by mouth every 6 (six) hours as needed. 09/07/18   Domenic Moras, PA-C  atorvastatin (LIPITOR) 20 MG tablet Take 1 tablet (20 mg total) by mouth daily. 04/11/22   Argentina Donovan, PA-C  Blood Glucose Monitoring Suppl (ACCU-CHEK GUIDE) w/Device KIT Check blood sugar once daily. 06/12/21   Charlott Rakes, MD  cyclobenzaprine (FLEXERIL) 10 MG tablet Take 1 tablet (10 mg  total) by mouth 2 (two) times daily as needed for muscle spasms. 10/06/21   Marcello Fennel, PA-C  empagliflozin (JARDIANCE) 25 MG TABS tablet Take 1 tablet (25 mg total) by mouth daily before breakfast. 04/11/22   Argentina Donovan, PA-C  glucose blood (ACCU-CHEK GUIDE) test strip Check blood sugar once daily. 06/12/21   Charlott Rakes, MD  insulin NPH-regular Human (NOVOLIN 70/30) (70-30) 100 UNIT/ML injection Inject 32 Units into the skin 2 (two) times daily with a meal. 04/11/22 04/11/23  Argentina Donovan, PA-C  Insulin Pen Needle (TRUEPLUS PEN NEEDLES) 32G X 4 MM MISC Use as directed to inject Trulicity [id number]   Thereasa Solo, Dionne Bucy, PA-C  Insulin Syringe-Needle U-100 (TRUEPLUS INSULIN SYRINGE) 31G X 5/16" 0.3 ML MISC Use as directed to administer insulin twice daily 04/11/22   Freeman Caldron M, PA-C  lisinopril-hydrochlorothiazide (ZESTORETIC) 20-25 MG tablet TAKE 1 TABLET BY MOUTH DAILY. 04/11/22 04/11/23  Argentina Donovan, PA-C  metoCLOPramide (REGLAN) 5 MG tablet Take 1 tablet (5 mg total) by mouth every 6 (six) hours as needed for nausea. Patient not taking: Reported on 11/27/2017 04/24/17 09/07/18  Brayton Caves, PA-C    ROS: Neg HEENT Neg resp Neg cardiac Neg GI Neg GU Neg MS Neg psych Neg neuro  Objective:   Vitals:   04/11/22 1033  BP: 137/67  Pulse: 93  SpO2: 97%  Weight: 261 lb 12.8 oz (118.8 kg)   Exam General appearance : Awake, alert, not in any distress. Speech Clear. Not toxic looking HEENT: Atraumatic and Normocephalic Neck: Supple, no JVD. No cervical lymphadenopathy.  Chest: Good air entry bilaterally, CTAB.  No rales/rhonchi/wheezing CVS: S1 S2 regular, no murmurs.  Extremities: B/L Lower Ext shows no edema, both legs are warm to touch Neurology: Awake alert, and oriented X 3, CN II-XII intact, Non focal Skin: No Rash  Data Review Lab Results  Component Value Date   HGBA1C 9.9 (A) 04/11/2022   HGBA1C 9.0 (A) 01/02/2022   HGBA1C 8.5 09/27/2021     Assessment & Plan   1. Type 2 diabetes mellitus with other specified complication, with long-term current use of insulin (HCC) Uncontrolled-he wants to try mounjaro which will require PA( I discussed with him) - Glucose (CBG) - HgB A1c - empagliflozin (JARDIANCE) 25 MG TABS tablet; Take 1 tablet (25 mg total) by mouth daily before breakfast.  Dispense: 90 tablet; Refill: 1 - insulin NPH-regular Human (NOVOLIN 70/30) (70-30) 100 UNIT/ML injection; Inject 32 Units into the skin 2 (two) times daily with a meal.  Dispense: 30 mL; Refill: 6 - Insulin Syringe-Needle U-100 (TRUEPLUS INSULIN SYRINGE) 31G X 5/16" 0.3 ML MISC; Use as directed to administer insulin twice daily  Dispense: 100 each; Refill: 3  2. Hypertension associated with diabetes ([place]) - lisinopril-hydrochlorothiazide (ZESTORETIC) 20-25 MG tablet; TAKE 1 TABLET BY MOUTH DAILY.  Dispense: 90 tablet; Refill: 1 And continue amlodipine mg daily  3. Hyperlipidemia associated with type 2 diabetes mellitus (HCC) - atorvastatin (LIPITOR) 20 MG tablet; Take 1 tablet (20 mg total) by mouth daily.  Dispense: 90 tablet; Refill: 1    Return for [facility] in 1 month for DM and 3 months with Dr Margarita Rana.  The patient was given clear instructions to go to ER or return to medical center if symptoms don't improve, worsen or new problems develop. The patient verbalized understanding. The patient was told to call to get lab results if they haven't heard anything in the next week.      Freeman Caldron, PA-C [facility] and [facility] [place], [place]   04/11/2022, 10:54 AM

## 2022-04-17 ENCOUNTER — Ambulatory Visit: Payer: Medicaid - Out of State | Admitting: Family Medicine

## 2022-05-09 ENCOUNTER — Other Ambulatory Visit: Payer: Self-pay

## 2022-05-09 ENCOUNTER — Telehealth: Payer: Self-pay | Admitting: Family Medicine

## 2022-05-09 NOTE — Telephone Encounter (Signed)
Pt called to report that he needs a prior authorization for his mounjaro. He just took his last injection yesterday. He says he was instructed to have his PCP call this line below.   Provider line: [phone]

## 2022-05-10 ENCOUNTER — Other Ambulatory Visit: Payer: Self-pay | Admitting: Family Medicine

## 2022-05-10 ENCOUNTER — Other Ambulatory Visit: Payer: Self-pay | Admitting: Pharmacist

## 2022-05-10 ENCOUNTER — Other Ambulatory Visit: Payer: Self-pay

## 2022-05-10 MED ORDER — OZEMPIC (0.25 OR 0.5 MG/DOSE) 2 MG/3ML ~~LOC~~ SOPN
0.2500 mg | PEN_INJECTOR | SUBCUTANEOUS | 0 refills | Status: DC
  Filled 2022-05-10: qty 3, 42d supply, fill #0

## 2022-05-10 MED ORDER — OZEMPIC (0.25 OR 0.5 MG/DOSE) 2 MG/1.5ML ~~LOC~~ SOPN
0.5000 mg | PEN_INJECTOR | SUBCUTANEOUS | 3 refills | Status: DC
  Filled 2022-05-10: qty 2, 35d supply, fill #0

## 2022-05-10 MED ORDER — OZEMPIC (0.25 OR 0.5 MG/DOSE) 2 MG/3ML ~~LOC~~ SOPN: 0.2500 mg | PEN_INJECTOR | SUBCUTANEOUS | 6 refills | Status: DC

## 2022-05-10 NOTE — Telephone Encounter (Signed)
Rx for Ozempic sent to Pharmacy

## 2022-05-10 NOTE — Telephone Encounter (Signed)
Already sent in today, duplicate request.

## 2022-05-10 NOTE — Telephone Encounter (Signed)
Pt requests that the Rx for Semaglutide,0.25 or 0.5MG /DOS, (OZEMPIC, 0.25 OR 0.5 MG/DOSE,) 2 MG/1.5ML SOPN be sent to  [place], [place] [address] Phone: [phone]  Fax: [phone]

## 2022-05-11 ENCOUNTER — Other Ambulatory Visit: Payer: Self-pay

## 2022-05-16 NOTE — Progress Notes (Deleted)
S:     PCP: Dr. Margarita Rana  37 y.o. male who presents for diabetes evaluation, education, and management. PMH is significant for HTN, T2DM, obesity, and toe amputation.   Patient was referred and last seen by Primary Care Provider, Freeman Caldron, on 04/11/2022.   At last visit, he reported GI intolerance to Trulicity and was switched to [facility]. However, the PA for this was denied as the patient must first trial both Trulicity and Ozempic.  Today, patient arrives in *** good spirits and presents without *** any assistance. ***  Patient reports Diabetes was diagnosed in ***.   Family/Social History:  -Fhx: HTN, DM -Tobacco: denies -Alcohol: ***  Current diabetes medications include: Jardiance 25 mg once daily, Novolin 70/30 32 units BID, and Ozempic 0.25 mg once weekly  Patient reports adherence to taking all medications as prescribed.  *** Patient denies adherence with medications, reports missing *** medications *** times per week, on average.  Insurance coverage: [place] Medicaid ***  Patient {Actions; denies-reports:120008} hypoglycemic events.  Reported home fasting blood sugars: ***  Reported 2 hour post-meal/random blood sugars: ***.  Patient {Actions; denies-reports:120008} nocturia (nighttime urination).  Patient {Actions; denies-reports:120008} neuropathy (nerve pain). Patient {Actions; denies-reports:120008} visual changes. Patient {Actions; denies-reports:120008} self foot exams.   Patient reported dietary habits: Eats *** meals/day Breakfast: *** Lunch: *** Dinner: *** Snacks: *** Drinks: ***  Patient-reported exercise habits: ***   O:   ROS  Physical Exam  7 day average blood glucose: ***  *** CGM Download:  % Time CGM is active: ***% Average Glucose: *** mg/dL Glucose Management Indicator: ***  Glucose Variability: *** (goal <36%) Time in Goal:  - Time in range 70-180: ***% - Time above range: ***% - Time below range: ***% Observed  patterns:   Lab Results  Component Value Date   HGBA1C 9.9 (A) 04/11/2022   There were no vitals filed for this visit.  Lipid Panel     Component Value Date/Time   CHOL 165 06/12/2021 0915   TRIG 95 06/12/2021 0915   HDL 54 06/12/2021 0915   CHOLHDL 3.1 06/12/2021 0915   CHOLHDL 3.8 04/12/2017 0036   VLDL 18 04/12/2017 0036   LDLCALC 94 06/12/2021 0915    Clinical Atherosclerotic Cardiovascular Disease (ASCVD): {YES/NO:21197} The ASCVD Risk score (Arnett DK, et al., 2019) failed to calculate for the following reasons:   The 2019 ASCVD risk score is only valid for ages 21 to 47    A/P: Diabetes longstanding *** currently ***. Patient is *** able to verbalize appropriate hypoglycemia management plan. Medication adherence appears ***. Control is suboptimal due to ***. -{Meds adjust:18428} basal insulin *** Lantus/Basaglar/Semglee (insulin glargine) *** Tresiba (insulin degludec) from *** units to *** units daily in the morning. Patient will continue to titrate 1 unit every *** days if fasting blood sugar > 100mg /dl until fasting blood sugars reach goal or next visit.  -{Meds adjust:18428} rapid insulin *** Novolog (insulin aspart) *** Humalog (insulin lispro) from *** to ***.  -{Meds XX123456 GLP-1 *** Trulicity (dulaglutide) *** Ozempic (semaglutide) *** Mounjaro (tirzepatide) from *** mg to *** mg .  -{Meds adjust:18428} SGLT2-I *** Farxiga (dapagliflozin) *** Jardiance (empagliflozin) 10 mg. Counseled on sick day rules. -{Meds adjust:18428} metformin ***.  -Patient educated on purpose, proper use, and potential adverse effects of ***.  -Extensively discussed pathophysiology of diabetes, recommended lifestyle interventions, dietary effects on blood sugar control.  -Counseled on s/sx of and management of hypoglycemia.  -Next A1c anticipated 06/2022.   Written patient  instructions provided. Patient verbalized understanding of treatment plan.  Total time in face to face  counseling *** minutes.    Follow-up:  Pharmacist ***. PCP clinic visit in   Maryan Puls, PharmD PGY-1 [facility] Pharmacy Resident

## 2022-05-17 ENCOUNTER — Ambulatory Visit: Payer: Medicaid - Out of State | Admitting: Pharmacist

## 2022-05-28 ENCOUNTER — Emergency Department (HOSPITAL_COMMUNITY): Payer: Medicaid - Out of State

## 2022-05-28 ENCOUNTER — Other Ambulatory Visit: Payer: Self-pay

## 2022-05-28 ENCOUNTER — Emergency Department (HOSPITAL_COMMUNITY)
Admission: EM | Admit: 2022-05-28 | Discharge: 2022-05-28 | Disposition: A | Discharge status: Home / Self Care | Payer: Medicaid - Out of State | Attending: Emergency Medicine | Admitting: Emergency Medicine

## 2022-05-28 ENCOUNTER — Encounter (HOSPITAL_COMMUNITY): Payer: Self-pay

## 2022-05-28 DIAGNOSIS — S99921A Unspecified injury of right foot, initial encounter: Secondary | ICD-10-CM

## 2022-05-28 DIAGNOSIS — Z7984 Long term (current) use of oral hypoglycemic drugs: Secondary | ICD-10-CM | POA: Diagnosis not present

## 2022-05-28 DIAGNOSIS — Z794 Long term (current) use of insulin: Secondary | ICD-10-CM | POA: Insufficient documentation

## 2022-05-28 DIAGNOSIS — S90931A Unspecified superficial injury of right great toe, initial encounter: Secondary | ICD-10-CM | POA: Diagnosis present

## 2022-05-28 DIAGNOSIS — E119 Type 2 diabetes mellitus without complications: Secondary | ICD-10-CM | POA: Insufficient documentation

## 2022-05-28 DIAGNOSIS — X58XXXA Exposure to other specified factors, initial encounter: Secondary | ICD-10-CM | POA: Diagnosis not present

## 2022-05-28 MED ORDER — DOXYCYCLINE HYCLATE 100 MG PO CAPS: 100.0000 mg | ORAL_CAPSULE | Freq: Two times a day (BID) | ORAL | 0 refills | Status: AC

## 2022-05-28 MED ORDER — DOXYCYCLINE HYCLATE 100 MG PO TABS
100.0000 mg | ORAL_TABLET | Freq: Once | ORAL | Status: AC
Start: 2022-05-28 — End: 2022-05-28
  Administered 2022-05-28: 100 mg via ORAL
  Filled 2022-05-28: qty 1

## 2022-05-28 NOTE — ED Provider Notes (Signed)
[place] EMERGENCY DEPARTMENT AT [facility] Provider Note   CSN: [id number] Arrival date & time: 05/28/22  2213     History  Chief Complaint  Patient presents with   Toe Injury    Jerry Arnold is a 37 y.o. male presenting today with a right great toe injury.  Reports he injured it multiple months ago but yesterday he started to have some drainage and odor.  Reports a history of diabetes so decreased sensation in his feet.  No fevers or chills.  Does say that he thought part of his toenail was infected so he removed it.  HPI     Home Medications Prior to Admission medications   Medication Sig Start Date End Date Taking? Authorizing Provider  Accu-Chek Softclix Lancets lancets Check blood sugar once daily. 06/12/21   Hoy Register, MD  acetaminophen (TYLENOL) 500 MG tablet Take 1 tablet (500 mg total) by mouth every 6 (six) hours as needed. 09/07/18   Fayrene Helper, PA-C  amLODipine (NORVASC) 10 MG tablet Take 1 tablet (10 mg total) by mouth daily. 04/11/22   Anders Simmonds, PA-C  atorvastatin (LIPITOR) 20 MG tablet Take 1 tablet (20 mg total) by mouth daily. 04/11/22   Anders Simmonds, PA-C  Blood Glucose Monitoring Suppl (ACCU-CHEK GUIDE) w/Device KIT Check blood sugar once daily. 06/12/21   Hoy Register, MD  cyclobenzaprine (FLEXERIL) 10 MG tablet Take 1 tablet (10 mg total) by mouth 2 (two) times daily as needed for muscle spasms. 10/06/21   Carroll Sage, PA-C  empagliflozin (JARDIANCE) 25 MG TABS tablet Take 1 tablet (25 mg total) by mouth daily before breakfast. 04/11/22   Anders Simmonds, PA-C  glucose blood (ACCU-CHEK GUIDE) test strip Check blood sugar once daily. 06/12/21   Hoy Register, MD  insulin NPH-regular Human (NOVOLIN 70/30) (70-30) 100 UNIT/ML injection Inject 32 Units into the skin 2 (two) times daily with a meal. 04/11/22 04/11/23  Anders Simmonds, PA-C  Insulin Pen Needle (TRUEPLUS PEN NEEDLES) 32G X 4 MM MISC Use as directed to inject  Trulicity 09/27/21   Sharon Seller, Marzella Schlein, PA-C  Insulin Syringe-Needle U-100 (TRUEPLUS INSULIN SYRINGE) 31G X 5/16" 0.3 ML MISC Use as directed to administer insulin twice daily 04/11/22   Georgian Co M, PA-C  lisinopril-hydrochlorothiazide (ZESTORETIC) 20-25 MG tablet TAKE 1 TABLET BY MOUTH DAILY. 04/11/22 04/11/23  Anders Simmonds, PA-C  Semaglutide,0.25 or 0.5MG /DOS, (OZEMPIC, 0.25 OR 0.5 MG/DOSE,) 2 MG/3ML SOPN Inject 0.25 mg into the skin once a week. For 4 weeks then increase to 0.5mg  thereafter 05/10/22   Hoy Register, MD  metoCLOPramide (REGLAN) 5 MG tablet Take 1 tablet (5 mg total) by mouth every 6 (six) hours as needed for nausea. Patient not taking: Reported on 11/27/2017 04/24/17 09/07/18  Vivianne Master, PA-C      Allergies    Patient has no known allergies.    Review of Systems   Review of Systems  Physical Exam Updated Vital Signs BP (!) 147/88 (BP Location: Left Arm)   Pulse (!) 101   Temp 98.5 F (36.9 C) (Oral)   Resp 18   Ht 6\' 1"  (1.854 m)   Wt 122 kg   SpO2 100%   BMI 35.49 kg/m  Physical Exam Vitals and nursing note reviewed.  Constitutional:      Appearance: Normal appearance.  HENT:     Head: Normocephalic and atraumatic.  Eyes:     General: No scleral icterus.    Conjunctiva/sclera: Conjunctivae  normal.  Pulmonary:     Effort: Pulmonary effort is normal. No respiratory distress.  Musculoskeletal:     Comments: Full range of motion of the MTP and interphalangeal joint.  Some purulent drainage from a callused area on the pad of the first great toe.  Multiple calluses to the plantar surfaces of the feet.  DP pulse present and intact  Skin:    Findings: No rash.  Neurological:     Mental Status: He is alert.  Psychiatric:        Mood and Affect: Mood normal.     ED Results / Procedures / Treatments   Labs (all labs ordered are listed, but only abnormal results are displayed) Labs Reviewed - No data to display  EKG None  Radiology DG Toe  Great Right  Result Date: 05/28/2022 CLINICAL DATA:  Soft tissue wound with odor, initial encounter EXAM: RIGHT GREAT TOE COMPARISON:  05/04/2021 FINDINGS: Soft tissue irregularity is noted in the distal aspect of the first toe consistent with the known history. No discrete erosive changes are identified to suggest osteomyelitis. No other focal abnormality is seen. Absence of the second toe is seen similar to that on the prior exam. IMPRESSION: Soft tissue wound without definitive bony erosive changes. Electronically Signed   By: Alcide Clever M.D.   On: 05/28/2022 23:03    Procedures Procedures   Medications Ordered in ED Medications - No data to display  ED Course/ Medical Decision Making/ A&P                             Medical Decision Making Amount and/or Complexity of Data Reviewed Radiology: ordered.   X-ray ordered, viewed and interpreted by me.  Agree with radiology that this is negative.  No signs of osteomyelitis despite this being the weaker imaging modality for this diagnosis.  37 year old male presenting with toe injury.  Has been having issues with this toe for the past couple of months.  Does still appear to be neurovascularly intact.  No signs of necrosis.  Purulent drainage noted.  Will be started on doxycycline and wound dressed in the department.  Given follow-up with PCP as well as a referral to the foot center.  Patient will be discharged at this time   Final Clinical Impression(s) / ED Diagnoses Final diagnoses:  Injury of toe on right foot, initial encounter    Rx / DC Orders ED Discharge Orders          Ordered    doxycycline (VIBRAMYCIN) 100 MG capsule  2 times daily        05/28/22 2313           Results and diagnoses were explained to the patient. Return precautions discussed in full. Patient had no additional questions and expressed complete understanding.   This chart was dictated using voice recognition software.  Despite best efforts to  proofread,  errors can occur which can change the documentation meaning.    Woodroe Chen 05/28/22 2314    Benjiman Core, MD 05/28/22 2340

## 2022-05-28 NOTE — ED Triage Notes (Addendum)
Patient reports he injured big toe on right foot back in January and also diabetic, has been taking a while to heal and noticed foul smell to it yesterday. Nail has been removed. Denies any drainage or fever.

## 2022-05-28 NOTE — ED Provider Triage Note (Signed)
Emergency Medicine Provider Triage Evaluation Note  Jerry Arnold , a 37 y.o. male  was evaluated in triage.  Pt complains of strong odor from the great toe for the past day.  Also reports that he noted some infection of the toenail so he subsequently removed the nail.  History of diabetes with poor sensation in his feet  Review of Systems  Positive:  Negative:   Physical Exam  BP (!) 147/88 (BP Location: Left Arm)   Pulse (!) 101   Temp 98.5 F (36.9 C) (Oral)   Resp 18   Ht 6\' 1"  (1.854 m)   Wt 122 kg   SpO2 100%   BMI 35.49 kg/m  Gen:   Awake, no distress   Resp:  Normal effort  MSK:   Moves extremities without difficulty  Other:  Severe callusing of the plantar surface of the right foot.  Lateral nail absent from the nailbed.  Area of callused skin to the pad of the great toe with some purulent drainage  Medical Decision Making  Medically screening exam initiated at 10:38 PM.  Appropriate orders placed.  Jerry Arnold was informed that the remainder of the evaluation will be completed by another provider, this initial triage assessment does not replace that evaluation, and the importance of remaining in the ED until their evaluation is complete.    Originally was going to fast track this patient however he does have purulence coming from the toe and a broken nail.  History of osteomyelitis.  Will go ahead and get imaging   Saddie Benders, PA-C 05/28/22 2240

## 2022-05-28 NOTE — Discharge Instructions (Signed)
You came to the emergency department with a wound to the right great toe.  There are no fractures or deep infections.  You do need to follow-up with your PCP.  I am also listing the podiatry center on these discharge papers so that you can take better care of your feet.  I am starting you on doxycycline.  This is an antibiotic, take it with food as it may upset your stomach.  It also increases the likelihood for sunburn so stay out of the sun for the next week.  It was a pleasure to meet you and we hope you feel better!

## 2022-07-10 ENCOUNTER — Encounter: Payer: Self-pay | Admitting: Family Medicine

## 2022-07-10 ENCOUNTER — Ambulatory Visit: Payer: Medicaid - Out of State | Attending: Family Medicine | Admitting: Family Medicine

## 2022-07-10 VITALS — BP 104/71 | HR 98 | Temp 98.2°F | Ht 73.0 in | Wt 250.8 lb

## 2022-07-10 DIAGNOSIS — E785 Hyperlipidemia, unspecified: Secondary | ICD-10-CM

## 2022-07-10 DIAGNOSIS — E1169 Type 2 diabetes mellitus with other specified complication: Secondary | ICD-10-CM | POA: Diagnosis not present

## 2022-07-10 DIAGNOSIS — K5903 Drug induced constipation: Secondary | ICD-10-CM | POA: Diagnosis not present

## 2022-07-10 DIAGNOSIS — E1159 Type 2 diabetes mellitus with other circulatory complications: Secondary | ICD-10-CM

## 2022-07-10 DIAGNOSIS — I152 Hypertension secondary to endocrine disorders: Secondary | ICD-10-CM

## 2022-07-10 DIAGNOSIS — Z794 Long term (current) use of insulin: Secondary | ICD-10-CM | POA: Diagnosis not present

## 2022-07-10 LAB — POCT GLYCOSYLATED HEMOGLOBIN (HGB A1C): HbA1c, POC (controlled diabetic range): 8.1 % — AB (ref 0.0–7.0)

## 2022-07-10 MED ORDER — SEMAGLUTIDE (1 MG/DOSE) 4 MG/3ML ~~LOC~~ SOPN: 1.0000 mg | PEN_INJECTOR | SUBCUTANEOUS | 3 refills | Status: DC

## 2022-07-10 MED ORDER — LUBIPROSTONE 8 MCG PO CAPS: 8.0000 ug | ORAL_CAPSULE | Freq: Two times a day (BID) | ORAL | 3 refills | Status: DC

## 2022-07-10 MED ORDER — OZEMPIC (0.25 OR 0.5 MG/DOSE) 2 MG/3ML ~~LOC~~ SOPN: 0.5000 mg | PEN_INJECTOR | SUBCUTANEOUS | 0 refills | Status: DC

## 2022-07-10 NOTE — Progress Notes (Signed)
Subjective:  Patient ID: Jerry Arnold, male    DOB: 07-Aug-1985  Age: 37 y.o. MRN: [id number]  CC: Diabetes   HPI Jerry Arnold is a 37 y.o. year old male with a history of Type 2 DM (A1c 8.1), HTN, hyperlipidemia, status post right second toe amputation in 03/2017 seen for follow-up visit   Interval History:  He has noticed presence of constipation since he started Ozempic. He does not feel as nauseous with Trulicity.  Bowel movements occur daily but are sometimes hard. A1c is 8.1 down from 9.9. He has had no hypoglycemia, denies presence of blurry vision and has no neuropathy.  Last eye exam was in 01/2022. He does have a slight bruise on his left third toe which he sustained while wearing heels.  He has not had a recent foot exam because he states it cost him $300 to see a podiatrist in [place]. He is walking more with a goal of losing weight. Endorses adherence with his antihypertensive and his statin. Denies planes of additional concerns today.  Past Medical History:  Diagnosis Date   Hypertension    Osteomyelitis (HCC) 04/11/2017   with "second toe on right foot" injury x 2-3 weeks (04/11/2017)   Type II diabetes mellitus (HCC)     Past Surgical History:  Procedure Laterality Date   AMPUTATION Right 04/13/2017   Procedure: SECOND FOOT RAY AMPUTATION;  Surgeon: Nadara Mustard, MD;  Location: [facility] OR;  Service: Orthopedics;  Laterality: Right;   NO PAST SURGERIES      Family History  Problem Relation Age of Onset   Hypertension Mother    Diabetes Mother    Hypertension Maternal Grandmother    Hypertension Paternal Grandmother     Social History   Socioeconomic History   Marital status: Single    Spouse name: Not on file   Number of children: Not on file   Years of education: Not on file   Highest education level: Not on file  Occupational History   Not on file  Tobacco Use   Smoking status: Never   Smokeless tobacco: Never  Vaping Use   Vaping Use: Never  used  Substance and Sexual Activity   Alcohol use: Yes    Comment: 04/11/2017 "might have a few drinks/year"   Drug use: No   Sexual activity: Yes  Other Topics Concern   Not on file  Social History Narrative   Not on file   Social Determinants of Health   Financial Resource Strain: Not on file  Food Insecurity: Not on file  Transportation Needs: Not on file  Physical Activity: Not on file  Stress: Not on file  Social Connections: Not on file    No Known Allergies  Outpatient Medications Prior to Visit  Medication Sig Dispense Refill   Accu-Chek Softclix Lancets lancets Check blood sugar once daily. 100 each 2   acetaminophen (TYLENOL) 500 MG tablet Take 1 tablet (500 mg total) by mouth every 6 (six) hours as needed. 30 tablet 0   amLODipine (NORVASC) 10 MG tablet Take 1 tablet (10 mg total) by mouth daily. 90 tablet 1   atorvastatin (LIPITOR) 20 MG tablet Take 1 tablet (20 mg total) by mouth daily. 90 tablet 1   Blood Glucose Monitoring Suppl (ACCU-CHEK GUIDE) w/Device KIT Check blood sugar once daily. 1 kit 0   cyclobenzaprine (FLEXERIL) 10 MG tablet Take 1 tablet (10 mg total) by mouth 2 (two) times daily as needed for muscle spasms. 20 tablet 0  empagliflozin (JARDIANCE) 25 MG TABS tablet Take 1 tablet (25 mg total) by mouth daily before breakfast. 90 tablet 1   glucose blood (ACCU-CHEK GUIDE) test strip Check blood sugar once daily. 100 each 2   insulin NPH-regular Human (NOVOLIN 70/30) (70-30) 100 UNIT/ML injection Inject 32 Units into the skin 2 (two) times daily with a meal. 30 mL 6   Insulin Pen Needle (TRUEPLUS PEN NEEDLES) 32G X 4 MM MISC Use as directed to inject Trulicity 100 each 11   Insulin Syringe-Needle U-100 (TRUEPLUS INSULIN SYRINGE) 31G X 5/16" 0.3 ML MISC Use as directed to administer insulin twice daily 100 each 3   lisinopril-hydrochlorothiazide (ZESTORETIC) 20-25 MG tablet TAKE 1 TABLET BY MOUTH DAILY. 90 tablet 1   Semaglutide,0.25 or 0.5MG /DOS,  (OZEMPIC, 0.25 OR 0.5 MG/DOSE,) 2 MG/3ML SOPN Inject 0.25 mg into the skin once a week. For 4 weeks then increase to 0.5mg  thereafter 3 mL 6   No facility-administered medications prior to visit.     ROS Review of Systems  Constitutional:  Negative for activity change and appetite change.  HENT:  Negative for sinus pressure and sore throat.   Respiratory:  Negative for chest tightness, shortness of breath and wheezing.   Cardiovascular:  Negative for chest pain and palpitations.  Gastrointestinal:  Negative for abdominal distention, abdominal pain and constipation.  Genitourinary: Negative.   Musculoskeletal: Negative.   Psychiatric/Behavioral:  Negative for behavioral problems and dysphoric mood.     Objective:  BP 104/71   Pulse 98   Temp 98.2 F (36.8 C) (Oral)   Ht 6\' 1"  (1.854 m)   Wt 250 lb 12.8 oz (113.8 kg)   SpO2 98%   BMI 33.09 kg/m      07/10/2022    9:43 AM 05/28/2022   10:22 PM 04/11/2022   10:33 AM  BP/Weight  Systolic BP 104 147 137  Diastolic BP 71 88 67  Wt. (Lbs) 250.8 269 261.8  BMI 33.09 kg/m2 35.49 kg/m2 34.54 kg/m2      Physical Exam Constitutional:      Appearance: He is well-developed.  Cardiovascular:     Rate and Rhythm: Normal rate.     Heart sounds: Normal heart sounds. No murmur heard. Pulmonary:     Effort: Pulmonary effort is normal.     Breath sounds: Normal breath sounds. No wheezing or rales.  Chest:     Chest wall: No tenderness.  Abdominal:     General: Bowel sounds are normal. There is no distension.     Palpations: Abdomen is soft. There is no mass.     Tenderness: There is no abdominal tenderness.  Musculoskeletal:        General: Normal range of motion.     Right lower leg: No edema.     Left lower leg: No edema.  Neurological:     Mental Status: He is alert and oriented to person, place, and time.  Psychiatric:        Mood and Affect: Mood normal.    Diabetic Foot Exam - Simple   Simple Foot Form Diabetic Foot  exam was performed with the following findings: Yes 07/10/2022 10:01 AM  Visual Inspection See comments: Yes Sensation Testing Intact to touch and monofilament testing bilaterally: Yes Pulse Check Posterior Tibialis and Dorsalis pulse intact bilaterally: Yes Comments Hammertoes bilaterally Right foot with amputation of second toe Left foot with superficial skin changes/scab limited to skin of third toe  Latest Ref Rng & Units 01/02/2022   10:58 AM 06/12/2021    9:15 AM 05/04/2021    1:54 PM  CMP  Glucose 70 - 99 mg/dL 161  096  045   BUN 6 - 20 mg/dL 38  24  32   Creatinine 0.76 - 1.27 mg/dL 4.09  8.11  9.14   Sodium 134 - 144 mmol/L 137  141  134   Potassium 3.5 - 5.2 mmol/L 4.7  4.9  4.8   Chloride 96 - 106 mmol/L 99  103  101   CO2 20 - 29 mmol/L 20  25  22    Calcium 8.7 - 10.2 mg/dL 9.4  78.2  8.6   Total Protein 6.0 - 8.5 g/dL 8.4  7.7  7.9   Total Bilirubin 0.0 - 1.2 mg/dL 0.5  0.3  0.4   Alkaline Phos 44 - 121 IU/L 138  130  118   AST 0 - 40 IU/L 17  26  17    ALT 0 - 44 IU/L 18  43  21     Lipid Panel     Component Value Date/Time   CHOL 165 06/12/2021 0915   TRIG 95 06/12/2021 0915   HDL 54 06/12/2021 0915   CHOLHDL 3.1 06/12/2021 0915   CHOLHDL 3.8 04/12/2017 0036   VLDL 18 04/12/2017 0036   LDLCALC 94 06/12/2021 0915    CBC    Component Value Date/Time   WBC 5.6 06/12/2021 0915   WBC 6.7 05/04/2021 1354   RBC 5.27 06/12/2021 0915   RBC 4.76 05/04/2021 1354   HGB 14.8 06/12/2021 0915   HCT 45.1 06/12/2021 0915   PLT 241 06/12/2021 0915   MCV 86 06/12/2021 0915   MCH 28.1 06/12/2021 0915   MCH 28.6 05/04/2021 1354   MCHC 32.8 06/12/2021 0915   MCHC 32.5 05/04/2021 1354   RDW 14.2 06/12/2021 0915   LYMPHSABS 2.7 06/12/2021 0915   MONOABS 0.8 05/04/2021 1354   EOSABS 0.3 06/12/2021 0915   BASOSABS 0.1 06/12/2021 0915    Lab Results  Component Value Date   HGBA1C 9.9 (A) 04/11/2022    Assessment & Plan:  1. Drug-induced  constipation Ozempic induced We have discussed the option of discontinue Ozempic however he would like to remain on it and be treated for constipation instead Counseled on increasing fiber intake, fruits and vegetable, limit intake of foods like cheese, white bread, white rice - lubiprostone (AMITIZA) 8 MCG capsule; Take 1 capsule (8 mcg total) by mouth 2 (two) times daily with a meal.  Dispense: 30 capsule; Refill: 3  2. Type 2 diabetes mellitus with other specified complication, with long-term current use of insulin (HCC) Uncontrolled with A1c of 8.1 but has improved compared to previous labs Goal A1c is less than 7.0 Will uptitrate Ozempic dose on we have discussed that if he develops intolerance with the higher doses we can revert back to the lower dose Currently advised to see a podiatrist and he plans on seeing one in [place] where his Medicaid will be accepted Adviced to wear wide toed shoes and diabetic shoes Counseled on Diabetic diet, my plate method, 956 minutes of moderate intensity exercise/week Blood sugar logs with fasting goals of 80-120 mg/dl, random of less than 213 and in the event of sugars less than 60 mg/dl or greater than 086 mg/dl encouraged to notify the clinic. Advised on the need for annual eye exams, annual foot exams, Pneumonia vaccine.  - Semaglutide,0.25 or 0.5MG /DOS, (OZEMPIC,  0.25 OR 0.5 MG/DOSE,) 2 MG/3ML SOPN; Inject 0.5 mg into the skin once a week. For 4 weeks then increase to 1mg   Dispense: 3 mL; Refill: 0 - Semaglutide, 1 MG/DOSE, 4 MG/3ML SOPN; Inject 1 mg as directed once a week.  Dispense: 3 mL; Refill: 3 - Microalbumin / creatinine urine ratio - LP+Non-HDL Cholesterol - CMP14+EGFR  3. Hypertension associated with diabetes (HCC) Controlled Continue amlodipine, lisinopril/HCTZ Counseled on blood pressure goal of less than 130/80, low-sodium, DASH diet, medication compliance, 150 minutes of moderate intensity exercise per week. Discussed  medication compliance, adverse effects.  4. Hyperlipidemia associated with type 2 diabetes mellitus (HCC) Controlled Continue atorvastatin Low-cholesterol diet    Meds ordered this encounter  Medications   lubiprostone (AMITIZA) 8 MCG capsule    Sig: Take 1 capsule (8 mcg total) by mouth 2 (two) times daily with a meal.    Dispense:  30 capsule    Refill:  3   Semaglutide,0.25 or 0.5MG /DOS, (OZEMPIC, 0.25 OR 0.5 MG/DOSE,) 2 MG/3ML SOPN    Sig: Inject 0.5 mg into the skin once a week. For 4 weeks then increase to 1mg     Dispense:  3 mL    Refill:  0    Discontinue 0.25mg    Semaglutide, 1 MG/DOSE, 4 MG/3ML SOPN    Sig: Inject 1 mg as directed once a week.    Dispense:  3 mL    Refill:  3    Follow-up: Return in about 3 months (around 10/10/2022) for Chronic medical conditions.       Hoy Register, MD, FAAFP. [facility] [place], [place] [phone]   07/10/2022, 10:05 AM

## 2022-07-10 NOTE — Patient Instructions (Signed)

## 2022-07-10 NOTE — Progress Notes (Signed)
Constipation 

## 2022-07-11 LAB — CMP14+EGFR
ALT: 20 IU/L (ref 0–44)
AST: 18 IU/L (ref 0–40)
Albumin/Globulin Ratio: 1.3 (ref 1.2–2.2)
Albumin: 4.5 g/dL (ref 4.1–5.1)
Alkaline Phosphatase: 154 IU/L — ABNORMAL HIGH (ref 44–121)
BUN/Creatinine Ratio: 16 (ref 9–20)
BUN: 29 mg/dL — ABNORMAL HIGH (ref 6–20)
Bilirubin Total: 0.3 mg/dL (ref 0.0–1.2)
CO2: 22 mmol/L (ref 20–29)
Calcium: 9 mg/dL (ref 8.7–10.2)
Chloride: 104 mmol/L (ref 96–106)
Creatinine, Ser: 1.76 mg/dL — ABNORMAL HIGH (ref 0.76–1.27)
Globulin, Total: 3.4 g/dL (ref 1.5–4.5)
Glucose: 127 mg/dL — ABNORMAL HIGH (ref 70–99)
Potassium: 4.8 mmol/L (ref 3.5–5.2)
Sodium: 141 mmol/L (ref 134–144)
Total Protein: 7.9 g/dL (ref 6.0–8.5)
eGFR: 50 mL/min/{1.73_m2} — ABNORMAL LOW (ref >59)

## 2022-07-11 LAB — MICROALBUMIN / CREATININE URINE RATIO
Creatinine, Urine: 130.6 mg/dL
Microalb/Creat Ratio: 24 mg/g creat (ref 0–29)
Microalbumin, Urine: 31.9 ug/mL

## 2022-07-11 LAB — LP+NON-HDL CHOLESTEROL
Cholesterol, Total: 140 mg/dL (ref 100–199)
HDL: 40 mg/dL (ref >39)
LDL Chol Calc (NIH): 79 mg/dL (ref 0–99)
Total Non-HDL-Chol (LDL+VLDL): 100 mg/dL (ref 0–129)
Triglycerides: 114 mg/dL (ref 0–149)
VLDL Cholesterol Cal: 21 mg/dL (ref 5–40)

## 2022-07-13 ENCOUNTER — Telehealth: Payer: Self-pay | Admitting: Family Medicine

## 2022-07-13 ENCOUNTER — Other Ambulatory Visit: Payer: Self-pay

## 2022-07-13 NOTE — Telephone Encounter (Signed)
CoverMyMeds/Insurance rejecting prior authorization claim with reject 'coverage termed'. Spoke to [facility] pharmacy to confirm billing information is the same as what is being submitted and this was verified. Pharmacy stated that their rejection is not indicating that patient is termed and that Amitiza is just needing a prior authorization. Pharmacy did not have the 800# I needed to call insurance. I left a message for patient to return my call.

## 2022-07-13 NOTE — Telephone Encounter (Signed)
Pt is calling to report that lubiprostone (AMITIZA) 8 MCG capsule [[id number]]  is needing PA from the insurance company. Please advise Cb- [phone]

## 2022-07-13 NOTE — Telephone Encounter (Signed)
fyi

## 2022-07-17 ENCOUNTER — Other Ambulatory Visit: Payer: Self-pay

## 2022-07-17 NOTE — Telephone Encounter (Signed)
Noted  

## 2022-07-18 ENCOUNTER — Other Ambulatory Visit: Payer: Self-pay | Admitting: Physician Assistant

## 2022-07-18 ENCOUNTER — Other Ambulatory Visit: Payer: Self-pay | Admitting: Family Medicine

## 2022-07-18 DIAGNOSIS — E1169 Type 2 diabetes mellitus with other specified complication: Secondary | ICD-10-CM

## 2022-07-18 DIAGNOSIS — Z794 Long term (current) use of insulin: Secondary | ICD-10-CM

## 2022-07-18 DIAGNOSIS — E1159 Type 2 diabetes mellitus with other circulatory complications: Secondary | ICD-10-CM

## 2022-07-20 ENCOUNTER — Telehealth: Payer: Self-pay

## 2022-07-20 ENCOUNTER — Other Ambulatory Visit: Payer: Self-pay

## 2022-07-20 NOTE — Telephone Encounter (Signed)
Called [facility] Medicaid today [phone] and spoke to Dexter. Due to the fact that provider is not enrolled/participating in [facility] Medicaid they are not able to process a prior authorization for Amitiza at this time. Patient will have to have a [facility] provider process the prior auth.

## 2022-07-27 ENCOUNTER — Other Ambulatory Visit: Payer: Self-pay | Admitting: Pharmacist

## 2022-07-27 ENCOUNTER — Other Ambulatory Visit: Payer: Self-pay

## 2022-07-27 ENCOUNTER — Ambulatory Visit: Payer: Self-pay

## 2022-07-27 DIAGNOSIS — Z794 Long term (current) use of insulin: Secondary | ICD-10-CM

## 2022-07-27 MED ORDER — NOVOLIN 70/30 FLEXPEN (70-30) 100 UNIT/ML ~~LOC~~ SUPN
32.0000 [IU] | PEN_INJECTOR | Freq: Two times a day (BID) | SUBCUTANEOUS | 2 refills | Status: DC
  Filled 2022-07-27: qty 9, 14d supply, fill #0

## 2022-07-27 MED ORDER — NOVOLIN 70/30 (70-30) 100 UNIT/ML ~~LOC~~ SUSP
32.0000 [IU] | Freq: Two times a day (BID) | SUBCUTANEOUS | 6 refills | Status: DC
  Filled 2022-07-27: qty 30, 47d supply, fill #0

## 2022-07-27 MED ORDER — "INSULIN SYRINGE-NEEDLE U-100 31G X 5/16"" 0.3 ML MISC"
3 refills | Status: AC
  Filled 2022-07-27: qty 100, 50d supply, fill #0

## 2022-07-27 MED ORDER — TRUEPLUS PEN NEEDLES 32G X 4 MM MISC
11 refills | Status: DC
  Filled 2022-07-27: qty 100, 25d supply, fill #0

## 2022-07-27 NOTE — Telephone Encounter (Signed)
  Chief Complaint: pharmacy out of vials of 70/30 insulin can they have order for 70/30 pens and pen needles Disposition: [] ED /[] Urgent Care (no appt availability in office) / [] Appointment(In office/virtual)/ []  [facility]/ [] Home Care/ [] Refused Recommended Disposition /[] [facility]/ [x]  Follow-up with PCP Additional Notes: - Reason for Disposition  [1] Pharmacy calling with prescription question AND [2] triager unable to answer question  Answer Assessment - Initial Assessment Questions 1. NAME of MEDICINE: "What medicine(s) are you calling about?"     70/30 Novolin  2. QUESTION: "What is your question?" (e.g., double dose of medicine, side effect)     out of vials May pt have pen 70/30 and needles called to [facility] in [place].  Protocols used: Medication Question Call-A-AH

## 2022-08-09 ENCOUNTER — Other Ambulatory Visit: Payer: Self-pay | Admitting: Family Medicine

## 2022-08-09 ENCOUNTER — Other Ambulatory Visit: Payer: Self-pay

## 2022-08-09 DIAGNOSIS — Z794 Long term (current) use of insulin: Secondary | ICD-10-CM

## 2022-08-09 NOTE — Telephone Encounter (Signed)
Requested Prescriptions  Pending Prescriptions Disp Refills   OZEMPIC, 0.25 OR 0.5 MG/DOSE, 2 MG/3ML SOPN [Pharmacy Med Name: Ozempic (0.25 or 0.5 MG/DOSE) 2 MG/3ML Subcutaneous Solution Pen-injector] 3 mL 0    Sig: INJECT 0.5 MG INTO THE SKIN ONCE A WEEK FOR 4 WEEKS THEN INCREASE TO 1 MG     Endocrinology:  Diabetes - GLP-1 Receptor Agonists - semaglutide Failed - 08/09/2022  9:15 AM      Failed - HBA1C in normal range and within 180 days    HbA1c, POC (controlled diabetic range)  Date Value Ref Range Status  07/10/2022 8.1 (A) 0.0 - 7.0 % Final         Failed - Cr in normal range and within 360 days    Creatinine, Ser  Date Value Ref Range Status  07/10/2022 1.76 (H) 0.76 - 1.27 mg/dL Final         Passed - Valid encounter within last 6 months    Recent Outpatient Visits           1 month ago Drug-induced constipation   [facility] [place], Odette Horns, MD   4 months ago Type 2 diabetes mellitus with other specified complication, with long-term current use of insulin [facility])   [facility] [place], [place], [place]   7 months ago Type 2 diabetes mellitus with other specified complication, with long-term current use of insulin (HCC)   [facility] [place], [place], MD   10 months ago Type 2 diabetes mellitus with other specified complication, with long-term current use of insulin [facility])   [facility] [place], [place], [place]   1 year ago Type 2 diabetes mellitus with other specified complication, with long-term current use of insulin [facility])   [place] [facility] [place], Cornelius Moras, RPH-CPP       Future Appointments             In 2 months Hoy Register, MD [facility]

## 2022-08-25 ENCOUNTER — Other Ambulatory Visit: Payer: Self-pay

## 2022-08-25 ENCOUNTER — Emergency Department (HOSPITAL_COMMUNITY): Payer: Medicaid - Out of State

## 2022-08-25 ENCOUNTER — Emergency Department (HOSPITAL_COMMUNITY)
Admission: EM | Admit: 2022-08-25 | Discharge: 2022-08-25 | Disposition: A | Discharge status: Home / Self Care | Payer: Medicaid - Out of State | Attending: Emergency Medicine | Admitting: Emergency Medicine

## 2022-08-25 ENCOUNTER — Encounter (HOSPITAL_COMMUNITY): Payer: Self-pay

## 2022-08-25 DIAGNOSIS — Z794 Long term (current) use of insulin: Secondary | ICD-10-CM | POA: Diagnosis not present

## 2022-08-25 DIAGNOSIS — E11628 Type 2 diabetes mellitus with other skin complications: Secondary | ICD-10-CM

## 2022-08-25 DIAGNOSIS — E119 Type 2 diabetes mellitus without complications: Secondary | ICD-10-CM | POA: Diagnosis not present

## 2022-08-25 DIAGNOSIS — L03116 Cellulitis of left lower limb: Secondary | ICD-10-CM | POA: Diagnosis not present

## 2022-08-25 DIAGNOSIS — S99922A Unspecified injury of left foot, initial encounter: Secondary | ICD-10-CM | POA: Diagnosis present

## 2022-08-25 DIAGNOSIS — Z79899 Other long term (current) drug therapy: Secondary | ICD-10-CM | POA: Diagnosis not present

## 2022-08-25 DIAGNOSIS — W269XXA Contact with unspecified sharp object(s), initial encounter: Secondary | ICD-10-CM | POA: Diagnosis not present

## 2022-08-25 DIAGNOSIS — I10 Essential (primary) hypertension: Secondary | ICD-10-CM | POA: Insufficient documentation

## 2022-08-25 DIAGNOSIS — Z7984 Long term (current) use of oral hypoglycemic drugs: Secondary | ICD-10-CM | POA: Insufficient documentation

## 2022-08-25 LAB — CBG MONITORING, ED: Glucose-Capillary: 151 mg/dL — ABNORMAL HIGH (ref 70–99)

## 2022-08-25 MED ORDER — DOXYCYCLINE HYCLATE 100 MG PO CAPS: 100.0000 mg | ORAL_CAPSULE | Freq: Two times a day (BID) | ORAL | 0 refills | Status: DC

## 2022-08-25 NOTE — Discharge Instructions (Addendum)
You were seen in the emergency department today for foot injury with possible infection.  As we discussed I like to put you on some antibiotics for possible infection of the wound on your foot.  Your x-ray did not show any evidence of infection in the bone.  Because you are at increased risk for deep infection due to your diabetes, it is incredibly important that you are keeping a close eye on your wound.  If you notice any worsening including redness, drainage, or pain, or if you develop fever/chills, return to the ER immediately for reevaluation.

## 2022-08-25 NOTE — ED Triage Notes (Signed)
"  Was shaving dead skin off of my left foot about a week ago and cut the ball of my foot. It hasn't healed up and seems to have a bit of an odor now. I am diabetic and have lost a toe before so I'm concerned" per pt\

## 2022-08-25 NOTE — ED Provider Notes (Signed)
[place] EMERGENCY DEPARTMENT AT [facility] Provider Note   CSN: [id number] Arrival date & time: 08/25/22  1557     History  Chief Complaint  Patient presents with   Foot Injury    Jerry Arnold is a 37 y.o. male with history of anemia, insulin-dependent diabetes, hypertension, who presents to the emergency department complaining of wound on the left foot.  Patient states that he was shaving some dead skin off his left foot about a week ago, and actually cut the ball of his foot.  It was starting to heal, when he noticed a bit of an odor.  He was concerned with his history of diabetes and prior right toe amputation prompting his visit to the ER.   Foot Injury Associated symptoms: no fever        Home Medications Prior to Admission medications   Medication Sig Start Date End Date Taking? Authorizing Provider  insulin isophane & regular human KwikPen (NOVOLIN 70/30 KWIKPEN) (70-30) 100 UNIT/ML KwikPen Inject 32 Units into the skin 2 (two) times daily. 07/27/22   Hoy Register, MD  Accu-Chek Softclix Lancets lancets Check blood sugar once daily. 06/12/21   Hoy Register, MD  acetaminophen (TYLENOL) 500 MG tablet Take 1 tablet (500 mg total) by mouth every 6 (six) hours as needed. 09/07/18   Fayrene Helper, PA-C  amLODipine (NORVASC) 10 MG tablet Take 1 tablet (10 mg total) by mouth daily. 04/11/22   Anders Simmonds, PA-C  atorvastatin (LIPITOR) 20 MG tablet Take 1 tablet by mouth once daily 07/18/22   Hoy Register, MD  Blood Glucose Monitoring Suppl (ACCU-CHEK GUIDE) w/Device KIT Check blood sugar once daily. 06/12/21   Hoy Register, MD  cyclobenzaprine (FLEXERIL) 10 MG tablet Take 1 tablet (10 mg total) by mouth 2 (two) times daily as needed for muscle spasms. 10/06/21   Carroll Sage, PA-C  doxycycline (VIBRAMYCIN) 100 MG capsule Take 1 capsule (100 mg total) by mouth 2 (two) times daily. 08/25/22  Yes Blaize Epple T, PA-C  glucose blood (ACCU-CHEK GUIDE) test  strip Check blood sugar once daily. 06/12/21   Hoy Register, MD  Insulin Pen Needle (TRUEPLUS PEN NEEDLES) 32G X 4 MM MISC Use as directed to inject Trulicity 07/27/22   Hoy Register, MD  Insulin Syringe-Needle U-100 (TRUEPLUS INSULIN SYRINGE) 31G X 5/16" 0.3 ML MISC Use as directed to administer insulin twice daily 07/27/22   Hoy Register, MD  JARDIANCE 25 MG TABS tablet TAKE 1 TABLET BY MOUTH ONCE DAILY BEFORE BREAKFAST 07/18/22   Hoy Register, MD  lisinopril-hydrochlorothiazide (ZESTORETIC) 20-25 MG tablet Take 1 tablet by mouth once daily 07/18/22   Hoy Register, MD  lubiprostone (AMITIZA) 8 MCG capsule Take 1 capsule (8 mcg total) by mouth 2 (two) times daily with a meal. 07/10/22   Newlin, Enobong, MD  OZEMPIC, 0.25 OR 0.5 MG/DOSE, 2 MG/3ML SOPN INJECT 0.5 MG INTO THE SKIN ONCE A WEEK FOR 4 WEEKS THEN INCREASE TO 1 MG 08/09/22   Newlin, Enobong, MD  Semaglutide, 1 MG/DOSE, 4 MG/3ML SOPN Inject 1 mg as directed once a week. 07/10/22   Hoy Register, MD  metoCLOPramide (REGLAN) 5 MG tablet Take 1 tablet (5 mg total) by mouth every 6 (six) hours as needed for nausea. Patient not taking: Reported on 11/27/2017 04/24/17 09/07/18  Vivianne Master, PA-C      Allergies    Patient has no known allergies.    Review of Systems   Review of Systems  Constitutional:  Negative for chills and fever.  Skin:  Positive for wound.  All other systems reviewed and are negative.   Physical Exam Updated Vital Signs BP 103/71 (BP Location: Right Arm)   Pulse 98   Temp 98.3 F (36.8 C) (Oral)   Resp 18   Ht 6\' 1"  (1.854 m)   Wt 99.8 kg   SpO2 97%   BMI 29.03 kg/m  Physical Exam Vitals and nursing note reviewed.  Constitutional:      Appearance: Normal appearance.  HENT:     Head: Normocephalic and atraumatic.  Eyes:     Conjunctiva/sclera: Conjunctivae normal.  Pulmonary:     Effort: Pulmonary effort is normal. No respiratory distress.  Feet:     Comments: Wound to the ball of the left  foot under the great toe, no fluctuance Skin:    General: Skin is warm and dry.  Neurological:     Mental Status: He is alert.  Psychiatric:        Mood and Affect: Mood normal.        Behavior: Behavior normal.       ED Results / Procedures / Treatments   Labs (all labs ordered are listed, but only abnormal results are displayed) Labs Reviewed  CBG MONITORING, ED - Abnormal; Notable for the following components:      Result Value   Glucose-Capillary 151 (*)    All other components within normal limits    EKG None  Radiology DG Foot Complete Left  Result Date: 08/25/2022 CLINICAL DATA:  Left foot wound. Evaluate for osteomyelitis. Large wound on the base of the second metatarsal EXAM: LEFT FOOT - COMPLETE 3+ VIEW COMPARISON:  02/27/2021. FINDINGS: No evidence for an acute fracture or dislocation. No bony lucency or destruction in the region of the second metatarsal to suggest osteomyelitis IMPRESSION: No radiographic evidence for osteomyelitis. MRI would be more sensitive to evaluate, as clinically warranted. Electronically Signed   By: Kennith Center M.D.   On: 08/25/2022 17:25    Procedures Procedures    Medications Ordered in ED Medications - No data to display  ED Course/ Medical Decision Making/ A&P                             Medical Decision Making  This patient is a 37 y.o. male  who presents to the ED for concern of foot wound.   Differential diagnoses prior to evaluation: The emergent differential diagnosis includes, but is not limited to,  cellulitis, osteomyelitis, diabetic foot wound. This is not an exhaustive differential.   Past Medical History / Co-morbidities / Social History: anemia, insulin-dependent diabetes, hypertension  Physical Exam: Physical exam performed. The pertinent findings include: Wound to the ball of the left foot under the great toe, no fluctuance  Lab Tests/Imaging studies: I personally interpreted labs/imaging and the  pertinent results include:  CBG 151. XR of left foot without radiographic evidence of osteomyelitis. I agree with the radiologist interpretation.  Disposition: After consideration of the diagnostic results and the patients response to treatment, I feel that emergency department workup does not suggest an emergent condition requiring admission or immediate intervention beyond what has been performed at this time. The plan is: discharge to home with antibiotics for diabetic foot wound. Clinically appears very well with only minimal signs of infection. Does not meet SIRS, no evidence of deep space infection. Will treat with MRSA coverage and give very strict return precautions.  Patient agreeable to this. Given podiatry follow up information. The patient is safe for discharge and has been instructed to return immediately for worsening symptoms, change in symptoms or any other concerns.  I discussed this case with my attending physician Dr. Theresia Lo who cosigned this note including patient's presenting symptoms, physical exam, and planned diagnostics and interventions. Attending physician stated agreement with plan or made changes to plan which were implemented.    Final Clinical Impression(s) / ED Diagnoses Final diagnoses:  Cellulitis in diabetic foot (HCC)    Rx / DC Orders ED Discharge Orders          Ordered    doxycycline (VIBRAMYCIN) 100 MG capsule  2 times daily        08/25/22 1739           Portions of this report may have been transcribed using voice recognition software. Every effort was made to ensure accuracy; however, inadvertent computerized transcription errors may be present.    Su Monks, PA-C 08/25/22 1744    Elayne Snare K, DO 08/25/22 1757

## 2022-10-11 ENCOUNTER — Ambulatory Visit: Payer: Medicaid - Out of State | Admitting: Physician Assistant

## 2022-10-16 ENCOUNTER — Ambulatory Visit: Payer: Medicaid - Out of State | Admitting: Family Medicine

## 2022-10-24 ENCOUNTER — Ambulatory Visit: Payer: Medicaid - Out of State | Attending: Family Medicine | Admitting: Physician Assistant

## 2022-10-24 ENCOUNTER — Encounter: Payer: Self-pay | Admitting: Physician Assistant

## 2022-10-24 VITALS — BP 103/70 | HR 104 | Wt 240.4 lb

## 2022-10-24 DIAGNOSIS — E1159 Type 2 diabetes mellitus with other circulatory complications: Secondary | ICD-10-CM

## 2022-10-24 DIAGNOSIS — E785 Hyperlipidemia, unspecified: Secondary | ICD-10-CM | POA: Diagnosis not present

## 2022-10-24 DIAGNOSIS — K5903 Drug induced constipation: Secondary | ICD-10-CM

## 2022-10-24 DIAGNOSIS — Z7985 Long-term (current) use of injectable non-insulin antidiabetic drugs: Secondary | ICD-10-CM

## 2022-10-24 DIAGNOSIS — Z23 Encounter for immunization: Secondary | ICD-10-CM

## 2022-10-24 DIAGNOSIS — R748 Abnormal levels of other serum enzymes: Secondary | ICD-10-CM

## 2022-10-24 DIAGNOSIS — Z7984 Long term (current) use of oral hypoglycemic drugs: Secondary | ICD-10-CM

## 2022-10-24 DIAGNOSIS — I152 Hypertension secondary to endocrine disorders: Secondary | ICD-10-CM

## 2022-10-24 DIAGNOSIS — E1169 Type 2 diabetes mellitus with other specified complication: Secondary | ICD-10-CM | POA: Diagnosis not present

## 2022-10-24 DIAGNOSIS — E1165 Type 2 diabetes mellitus with hyperglycemia: Secondary | ICD-10-CM | POA: Diagnosis not present

## 2022-10-24 DIAGNOSIS — Z794 Long term (current) use of insulin: Secondary | ICD-10-CM

## 2022-10-24 LAB — POCT GLYCOSYLATED HEMOGLOBIN (HGB A1C): HbA1c, POC (controlled diabetic range): 7.6 % — AB (ref 0.0–7.0)

## 2022-10-24 LAB — GLUCOSE, POCT (MANUAL RESULT ENTRY): POC Glucose: 93 mg/dL (ref 70–99)

## 2022-10-24 MED ORDER — TRUEPLUS PEN NEEDLES 32G X 4 MM MISC
11 refills | Status: AC
Start: 2022-10-24 — End: ?

## 2022-10-24 MED ORDER — EMPAGLIFLOZIN 25 MG PO TABS
25.0000 mg | ORAL_TABLET | Freq: Every day | ORAL | 1 refills | Status: DC
Start: 2022-10-24 — End: 2023-02-26

## 2022-10-24 MED ORDER — AMLODIPINE BESYLATE 10 MG PO TABS
10.0000 mg | ORAL_TABLET | Freq: Every day | ORAL | 1 refills | Status: DC
Start: 2022-10-24 — End: 2023-02-26

## 2022-10-24 MED ORDER — ATORVASTATIN CALCIUM 20 MG PO TABS
20.0000 mg | ORAL_TABLET | Freq: Every day | ORAL | 1 refills | Status: DC
Start: 2022-10-24 — End: 2023-02-26

## 2022-10-24 MED ORDER — LUBIPROSTONE 8 MCG PO CAPS: 8.0000 ug | ORAL_CAPSULE | Freq: Two times a day (BID) | ORAL | 3 refills | Status: DC

## 2022-10-24 MED ORDER — NOVOLIN 70/30 FLEXPEN (70-30) 100 UNIT/ML ~~LOC~~ SUPN: 32.0000 [IU] | PEN_INJECTOR | Freq: Two times a day (BID) | SUBCUTANEOUS | 2 refills | Status: DC

## 2022-10-24 MED ORDER — SEMAGLUTIDE (1 MG/DOSE) 4 MG/3ML ~~LOC~~ SOPN
1.0000 mg | PEN_INJECTOR | SUBCUTANEOUS | 3 refills | Status: DC
Start: 2022-10-24 — End: 2023-02-26

## 2022-10-24 MED ORDER — LISINOPRIL-HYDROCHLOROTHIAZIDE 20-25 MG PO TABS: 1.0000 | ORAL_TABLET | Freq: Every day | ORAL | 1 refills | Status: DC

## 2022-10-24 NOTE — Progress Notes (Signed)
Patient ID: Jerry Arnold, male   DOB: 1986/01/27, 37 y.o.   MRN: [id number]   Jerry Arnold, is a 37 y.o. male  AOZ:[id number]  NGE:[id number]  DOB - Feb 11, 1986  Chief Complaint  Patient presents with   Medication Refill   Diabetes       Subjective:   Jerry Arnold is a 37 y.o. male here today for a diabetes check.  He is supposed to be taking 1mg  ozempic weekly but his pharmacy has only been giving him 0.5mg  weekly.  He denies issues or concerns.  Blood sugars <180 when he checks them.  Needs RF on everything.  He would like to get a flu shot today.   No problems updated.  ALLERGIES: No Known Allergies  PAST MEDICAL HISTORY: Past Medical History:  Diagnosis Date   Hypertension    Osteomyelitis (HCC) 04/11/2017   with "second toe on right foot" injury x 2-3 weeks (04/11/2017)   Type II diabetes mellitus (HCC)     MEDICATIONS AT HOME: Prior to Admission medications   Medication Sig Start Date End Date Taking? Authorizing Provider  Accu-Chek Softclix Lancets lancets Check blood sugar once daily. 06/12/21  Yes Hoy Register, MD  acetaminophen (TYLENOL) 500 MG tablet Take 1 tablet (500 mg total) by mouth every 6 (six) hours as needed. 09/07/18  Yes Fayrene Helper, PA-C  Blood Glucose Monitoring Suppl (ACCU-CHEK GUIDE) w/Device KIT Check blood sugar once daily. 06/12/21  Yes Hoy Register, MD  cyclobenzaprine (FLEXERIL) 10 MG tablet Take 1 tablet (10 mg total) by mouth 2 (two) times daily as needed for muscle spasms. 10/06/21  Yes Carroll Sage, PA-C  glucose blood (ACCU-CHEK GUIDE) test strip Check blood sugar once daily. 06/12/21  Yes Hoy Register, MD  Insulin Syringe-Needle U-100 (TRUEPLUS INSULIN SYRINGE) 31G X 5/16" 0.3 ML MISC Use as directed to administer insulin twice daily 07/27/22  Yes Newlin, Enobong, MD  amLODipine (NORVASC) 10 MG tablet Take 1 tablet (10 mg total) by mouth daily. 10/24/22   Anders Simmonds, PA-C  atorvastatin (LIPITOR) 20 MG tablet Take 1 tablet  (20 mg total) by mouth daily. 10/24/22   Anders Simmonds, PA-C  empagliflozin (JARDIANCE) 25 MG TABS tablet Take 1 tablet (25 mg total) by mouth daily before breakfast. 10/24/22   Sharon Seller, Marzella Schlein, PA-C  insulin isophane & regular human KwikPen (NOVOLIN 70/30 KWIKPEN) (70-30) 100 UNIT/ML KwikPen Inject 32 Units into the skin 2 (two) times daily. 10/24/22   Anders Simmonds, PA-C  Insulin Pen Needle (TRUEPLUS PEN NEEDLES) 32G X 4 MM MISC Use as directed to inject Trulicity 10/24/22   Kawana Hegel, Marzella Schlein, PA-C  lisinopril-hydrochlorothiazide (ZESTORETIC) 20-25 MG tablet Take 1 tablet by mouth daily. 10/24/22   Anders Simmonds, PA-C  lubiprostone (AMITIZA) 8 MCG capsule Take 1 capsule (8 mcg total) by mouth 2 (two) times daily with a meal. 10/24/22   Mary-Ann Pennella, Marzella Schlein, PA-C  Semaglutide, 1 MG/DOSE, 4 MG/3ML SOPN Inject 1 mg as directed once a week. 10/24/22   Anders Simmonds, PA-C  metoCLOPramide (REGLAN) 5 MG tablet Take 1 tablet (5 mg total) by mouth every 6 (six) hours as needed for nausea. Patient not taking: Reported on 11/27/2017 04/24/17 09/07/18  Vivianne Master, PA-C    ROS: Neg HEENT Neg resp Neg cardiac Neg GI Neg GU Neg MS Neg psych Neg neuro  Objective:   Vitals:   10/24/22 0958  BP: 103/70  Pulse: (!) 104  SpO2: 100%  Weight: 240 lb 6.4 oz (  109 kg)   Exam General appearance : Awake, alert, not in any distress. Speech Clear. Not toxic looking HEENT: Atraumatic and Normocephalic Neck: Supple, no JVD. No cervical lymphadenopathy.  Chest: Good air entry bilaterally, CTAB.  No rales/rhonchi/wheezing CVS: S1 S2 regular, no murmurs.  Extremities: B/L Lower Ext shows no edema, both legs are warm to touch Neurology: Awake alert, and oriented X 3, CN II-XII intact, Non focal Skin: No Rash  Data Review Lab Results  Component Value Date   HGBA1C 8.1 (A) 07/10/2022   HGBA1C 9.9 (A) 04/11/2022   HGBA1C 9.0 (A) 01/02/2022    Assessment & Plan   1. Type 2 diabetes mellitus with  hyperglycemia, unspecified whether long term insulin use (HCC)  A1C-7.6 today  32 Units into the skin 2 (two) times daily.  Dispense: 15 mL; Refill: 2 - Insulin Pen Needle (TRUEPLUS PEN NEEDLES) 32G X 4 MM MISC; Use as directed to inject Trulicity  Dispense: 100 each; Refill: 11 Continue jardiance and increase ozempic to 1mg   2. Hypertension associated with diabetes (HCC) controlled - Comprehensive metabolic panel - lisinopril-hydrochlorothiazide (ZESTORETIC) 20-25 MG tablet; Take 1 tablet by mouth daily.  Dispense: 90 tablet; Refill: 1 - amLODipine (NORVASC) 10 MG tablet; Take 1 tablet (10 mg total) by mouth daily.  Dispense: 90 tablet; Refill: 1  3. Hyperlipidemia associated with type 2 diabetes mellitus (HCC) Was perfect in 06/2022 - atorvastatin (LIPITOR) 20 MG tablet; Take 1 tablet (20 mg total) by mouth daily.  Dispense: 90 tablet; Refill: 1  4. Drug-induced constipation - lubiprostone (AMITIZA) 8 MCG capsule; Take 1 capsule (8 mcg total) by mouth 2 (two) times daily with a meal.  Dispense: 30 capsule; Refill: 3  5. Abnormal creatine kinase level Drink 80 ounces water daily - Comprehensive metabolic panel  6. Needs flu shot Flu shot given    Return in about 4 months (around 02/23/2023) for PCP for chronic conditions-Newlin.  The patient was given clear instructions to go to ER or return to medical center if symptoms don't improve, worsen or new problems develop. The patient verbalized understanding. The patient was told to call to get lab results if they haven't heard anything in the next week.      [place]ian Co, PA-C [facility] [place], [place] [phone]   10/24/2022, 10:15 AM

## 2022-10-24 NOTE — Patient Instructions (Signed)
Drink at least 80 ounces water daily

## 2022-10-25 ENCOUNTER — Other Ambulatory Visit: Payer: Self-pay | Admitting: Physician Assistant

## 2022-10-25 DIAGNOSIS — I129 Hypertensive chronic kidney disease with stage 1 through stage 4 chronic kidney disease, or unspecified chronic kidney disease: Secondary | ICD-10-CM

## 2022-10-25 LAB — COMPREHENSIVE METABOLIC PANEL
ALT: 13 IU/L (ref 0–44)
AST: 13 IU/L (ref 0–40)
Albumin: 4.5 g/dL (ref 4.1–5.1)
Alkaline Phosphatase: 145 IU/L — ABNORMAL HIGH (ref 44–121)
BUN/Creatinine Ratio: 21 — ABNORMAL HIGH (ref 9–20)
BUN: 39 mg/dL — ABNORMAL HIGH (ref 6–20)
Bilirubin Total: 0.4 mg/dL (ref 0.0–1.2)
CO2: 23 mmol/L (ref 20–29)
Calcium: 9.5 mg/dL (ref 8.7–10.2)
Chloride: 103 mmol/L (ref 96–106)
Creatinine, Ser: 1.83 mg/dL — ABNORMAL HIGH (ref 0.76–1.27)
Globulin, Total: 3.5 g/dL (ref 1.5–4.5)
Glucose: 98 mg/dL (ref 70–99)
Potassium: 4.9 mmol/L (ref 3.5–5.2)
Sodium: 141 mmol/L (ref 134–144)
Total Protein: 8 g/dL (ref 6.0–8.5)
eGFR: 48 mL/min/{1.73_m2} — ABNORMAL LOW (ref >59)

## 2022-10-26 ENCOUNTER — Telehealth: Payer: Self-pay

## 2022-10-26 NOTE — Telephone Encounter (Signed)
Pt was called and vm was left, Information has been sent to nurse pool.   Results with doctors comment is also available in [place]

## 2022-10-26 NOTE — Telephone Encounter (Signed)
This encounter was created in error - please disregard.

## 2022-10-26 NOTE — Telephone Encounter (Signed)
Pt given lab results per notes of A. McClung PA on 10/26/22. Pt verbalized understanding.

## 2022-10-26 NOTE — Telephone Encounter (Signed)
-----   Message from Georgian Co sent at 10/25/2022  1:19 PM EDT ----- Your kidney function is still high and I am referring you to nephrology.  Better blood sugar control and adequate water intake helps kidney function.  Drink 80 ounces water daily and keep taking meds as directed.  Thanks, Georgian Co, PA-C

## 2022-12-31 ENCOUNTER — Telehealth: Payer: Self-pay | Admitting: Family Medicine

## 2022-12-31 NOTE — Telephone Encounter (Signed)
Noted  

## 2022-12-31 NOTE — Telephone Encounter (Signed)
Patient called back, advised him of previous message. Patient verbal understanding and states he will contact insurance company.

## 2022-12-31 NOTE — Telephone Encounter (Signed)
Medication Refill -  Most Recent Primary Care Visit:  Provider: Anders Simmonds  Department: [facility]  Visit Type: OFFICE VISIT  Date: 10/24/2022  Medication: Semaglutide, 1 MG/DOSE, 4 MG/3ML SOPN [[id number]]    (lost medication during travel)    Has the patient contacted their pharmacy? No  Is this the correct pharmacy for this prescription?  [facility] [address],  - [address]  [address] [place] [address]  Phone: [phone] Fax: [phone]  Hours: Not open 24 hours     I Has the prescription been filled recently? Yes  Is the patient out of the medication? No  Has the patient been seen for an appointment in the last year OR does the patient have an upcoming appointment? Yes  Can we respond through MyChart? No  Agent: Please be advised that Rx refills may take up to 3 business days. We ask that you follow-up with your pharmacy.

## 2022-12-31 NOTE — Telephone Encounter (Signed)
[facility] called and spoke to [place], Pensions consultant about the refill(s) semaglutide requested. Advised it was sent on 10/24/22 3 refill(s). She says the patient has 1 refill left, but it's showing insurance will not pay for it until 01/10/23. She says the patient can try calling insurance company explaining the lost of medication to see if they will override that decision, but no guarantees. Patient called, no answer, mailbox is full. Will try patient again later on today.

## 2023-01-06 ENCOUNTER — Other Ambulatory Visit: Payer: Self-pay | Admitting: Physician Assistant

## 2023-01-06 DIAGNOSIS — E1165 Type 2 diabetes mellitus with hyperglycemia: Secondary | ICD-10-CM

## 2023-01-07 NOTE — Telephone Encounter (Signed)
Requested by interface surescripts. Future visit in 1 month.  Requested Prescriptions  Pending Prescriptions Disp Refills   HUMULIN 70/30 KWIKPEN (70-30) 100 UNIT/ML KwikPen [Pharmacy Med Name: HumuLIN 70/30 KwikPen (70-30) 100 UNIT/ML Subcutaneous Suspension Pen-injector] 45 mL 0    Sig: INJECT 32 UNITS SUBCUTANEOUSLY TWICE DAILY     Endocrinology:  Diabetes - Insulins Passed - 01/06/2023 11:10 AM      Passed - HBA1C is between 0 and 7.9 and within 180 days    HbA1c, POC (controlled diabetic range)  Date Value Ref Range Status  10/24/2022 7.6 (A) 0.0 - 7.0 % Final         Passed - Valid encounter within last 6 months    Recent Outpatient Visits           2 months ago Type 2 diabetes mellitus with hyperglycemia, unspecified whether long term insulin use (HCC)   [facility] [place]. [facility] Andrew, [place], [place]   6 months ago Drug-induced constipation   [facility] [place]. [facility] Hoy Register, MD   9 months ago Type 2 diabetes mellitus with other specified complication, with long-term current use of insulin (HCC)   [facility] [place]. [facility] T[place], [place], [place]   1 year ago Type 2 diabetes mellitus with other specified complication, with long-term current use of insulin (HCC)   [facility] [place]. [facility] Hoy Register, MD   1 year ago Type 2 diabetes mellitus with other specified complication, with long-term current use of insulin (HCC)   [facility] [place]. [facility], Marzella Schlein, [place]       Future Appointments             In 1 month Hoy Register, MD [facility] [place]. [facility]

## 2023-02-26 ENCOUNTER — Ambulatory Visit: Payer: Medicaid - Out of State | Attending: Family Medicine | Admitting: Family Medicine

## 2023-02-26 ENCOUNTER — Encounter: Payer: Self-pay | Admitting: Family Medicine

## 2023-02-26 VITALS — BP 99/67 | HR 95 | Ht 73.0 in | Wt 235.8 lb

## 2023-02-26 DIAGNOSIS — Z7984 Long term (current) use of oral hypoglycemic drugs: Secondary | ICD-10-CM | POA: Diagnosis not present

## 2023-02-26 DIAGNOSIS — Z794 Long term (current) use of insulin: Secondary | ICD-10-CM | POA: Diagnosis not present

## 2023-02-26 DIAGNOSIS — I129 Hypertensive chronic kidney disease with stage 1 through stage 4 chronic kidney disease, or unspecified chronic kidney disease: Secondary | ICD-10-CM

## 2023-02-26 DIAGNOSIS — E1169 Type 2 diabetes mellitus with other specified complication: Secondary | ICD-10-CM

## 2023-02-26 DIAGNOSIS — Z7985 Long-term (current) use of injectable non-insulin antidiabetic drugs: Secondary | ICD-10-CM

## 2023-02-26 DIAGNOSIS — E1122 Type 2 diabetes mellitus with diabetic chronic kidney disease: Secondary | ICD-10-CM

## 2023-02-26 DIAGNOSIS — N1831 Chronic kidney disease, stage 3a: Secondary | ICD-10-CM

## 2023-02-26 DIAGNOSIS — E785 Hyperlipidemia, unspecified: Secondary | ICD-10-CM

## 2023-02-26 LAB — POCT GLYCOSYLATED HEMOGLOBIN (HGB A1C): HbA1c, POC (controlled diabetic range): 7.1 % — AB (ref 0.0–7.0)

## 2023-02-26 MED ORDER — SEMAGLUTIDE (1 MG/DOSE) 4 MG/3ML ~~LOC~~ SOPN
1.0000 mg | PEN_INJECTOR | SUBCUTANEOUS | 1 refills | Status: DC
Start: 2023-02-26 — End: 2023-07-22

## 2023-02-26 MED ORDER — HUMULIN 70/30 KWIKPEN (70-30) 100 UNIT/ML ~~LOC~~ SUPN: 32.0000 [IU] | PEN_INJECTOR | Freq: Two times a day (BID) | SUBCUTANEOUS | 6 refills | Status: DC

## 2023-02-26 MED ORDER — ATORVASTATIN CALCIUM 20 MG PO TABS: 20.0000 mg | ORAL_TABLET | Freq: Every day | ORAL | 1 refills | Status: DC

## 2023-02-26 MED ORDER — AMLODIPINE BESYLATE 10 MG PO TABS: 10.0000 mg | ORAL_TABLET | Freq: Every day | ORAL | 1 refills | Status: DC

## 2023-02-26 MED ORDER — EMPAGLIFLOZIN 25 MG PO TABS: 25.0000 mg | ORAL_TABLET | Freq: Every day | ORAL | 1 refills | Status: DC

## 2023-02-26 MED ORDER — LISINOPRIL-HYDROCHLOROTHIAZIDE 20-25 MG PO TABS
1.0000 | ORAL_TABLET | Freq: Every day | ORAL | 1 refills | Status: DC
Start: 2023-02-26 — End: 2023-08-26

## 2023-02-26 NOTE — Progress Notes (Signed)
 Subjective:  Patient ID: Jerry Arnold, male    DOB: Dec 13, 1985  Age: 38 y.o. MRN: [id number]  CC: Medical Management of Chronic Issues   HPI Jerry Arnold is a 38 y.o. year old male with a history of Type 2 DM (A1c 7.1), HTN, hyperlipidemia, status post right second toe amputation in 03/2017, Stage 3 CKD seen for follow-up visit   Interval History: Discussed the use of AI scribe software for clinical note transcription with the patient, who gave verbal consent to proceed.  He presents for a routine follow-up. He has lost about fifteen pounds since July, which was partially intentional. He denies any major concerns today. His blood pressure is well controlled at 99/67, and he denies any lightheadedness. He is currently on amlodipine  10mg  and lisinopril -hydrochlorothiazide  for hypertension. He has discontinued Amitiza , previously prescribed for constipation.  His blood sugars at home have been running well, with fasting levels in the low hundreds and postprandial levels up to 160-170 depending on the meal. He denies any symptoms of hypoglycemia such as sweats or shakes. He is currently on Ozempic  1mg , which he tolerates well and prefers over Trulicity  due to severe side effects with the latter.  A1c today is 7.1  The patient also has stage three kidney disease, for which he was referred to [facility] but has not yet followed up.  His chart revealed he had given him a call but could not reach him.  He has an upcoming appointment with an ophthalmologist and a retina consultant for an unspecified eye condition.        Past Medical History:  Diagnosis Date   Hypertension    Osteomyelitis (HCC) 04/11/2017   with second toe on right foot injury x 2-3 weeks (04/11/2017)   Type II diabetes mellitus (HCC)     Past Surgical History:  Procedure Laterality Date   AMPUTATION Right 04/13/2017   Procedure: SECOND FOOT RAY AMPUTATION;  Surgeon: Harden Jerona GAILS, MD;  Location: [facility] OR;   Service: Orthopedics;  Laterality: Right;   NO PAST SURGERIES      Family History  Problem Relation Age of Onset   Hypertension Mother    Diabetes Mother    Hypertension Maternal Grandmother    Hypertension Paternal Grandmother     Social History   Socioeconomic History   Marital status: Single    Spouse name: Not on file   Number of children: Not on file   Years of education: Not on file   Highest education level: Not on file  Occupational History   Not on file  Tobacco Use   Smoking status: Never   Smokeless tobacco: Never  Vaping Use   Vaping status: Never Used  Substance and Sexual Activity   Alcohol use: Yes    Comment: 04/11/2017 might have a few drinks/year   Drug use: No   Sexual activity: Yes  Other Topics Concern   Not on file  Social History Narrative   Not on file   Social Drivers of Health   Financial Resource Strain: Not on file  Food Insecurity: Not on file  Transportation Needs: Not on file  Physical Activity: Not on file  Stress: Not on file  Social Connections: Not on file    No Known Allergies  Outpatient Medications Prior to Visit  Medication Sig Dispense Refill   Accu-Chek Softclix Lancets lancets Check blood sugar once daily. 100 each 2   acetaminophen  (TYLENOL ) 500 MG tablet Take 1 tablet (500 mg total) by  mouth every 6 (six) hours as needed. 30 tablet 0   Blood Glucose Monitoring Suppl (ACCU-CHEK GUIDE) w/Device KIT Check blood sugar once daily. 1 kit 0   glucose blood (ACCU-CHEK GUIDE) test strip Check blood sugar once daily. 100 each 2   Insulin  Pen Needle (TRUEPLUS PEN NEEDLES) 32G X 4 MM MISC Use as directed to inject Trulicity  100 each 11   Insulin  Syringe-Needle U-100 (TRUEPLUS INSULIN  SYRINGE) 31G X 5/16 0.3 ML MISC Use as directed to administer insulin  twice daily 100 each 3   amLODipine  (NORVASC ) 10 MG tablet Take 1 tablet (10 mg total) by mouth daily. 90 tablet 1   atorvastatin  (LIPITOR) 20 MG tablet Take 1 tablet (20 mg  total) by mouth daily. 90 tablet 1   empagliflozin  (JARDIANCE ) 25 MG TABS tablet Take 1 tablet (25 mg total) by mouth daily before breakfast. 90 tablet 1   HUMULIN  70/30 KWIKPEN (70-30) 100 UNIT/ML KwikPen INJECT 32 UNITS SUBCUTANEOUSLY TWICE DAILY 45 mL 0   lisinopril -hydrochlorothiazide  (ZESTORETIC ) 20-25 MG tablet Take 1 tablet by mouth daily. 90 tablet 1   Semaglutide , 1 MG/DOSE, 4 MG/3ML SOPN Inject 1 mg as directed once a week. 3 mL 3   cyclobenzaprine  (FLEXERIL ) 10 MG tablet Take 1 tablet (10 mg total) by mouth 2 (two) times daily as needed for muscle spasms. (Patient not taking: Reported on 02/26/2023) 20 tablet 0   lubiprostone  (AMITIZA ) 8 MCG capsule Take 1 capsule (8 mcg total) by mouth 2 (two) times daily with a meal. (Patient not taking: Reported on 02/26/2023) 30 capsule 3   No facility-administered medications prior to visit.     ROS Review of Systems  Constitutional:  Negative for activity change and appetite change.  HENT:  Negative for sinus pressure and sore throat.   Respiratory:  Negative for chest tightness, shortness of breath and wheezing.   Cardiovascular:  Negative for chest pain and palpitations.  Gastrointestinal:  Negative for abdominal distention, abdominal pain and constipation.  Genitourinary: Negative.   Musculoskeletal: Negative.   Psychiatric/Behavioral:  Negative for behavioral problems and dysphoric mood.     Objective:  BP 99/67   Pulse 95   Ht 6' 1 (1.854 m)   Wt 235 lb 12.8 oz (107 kg)   SpO2 99%   BMI 31.11 kg/m      02/26/2023    8:54 AM 10/24/2022    9:58 AM 08/25/2022    5:30 PM  BP/Weight  Systolic BP 99 103 113  Diastolic BP 67 70 92  Wt. (Lbs) 235.8 240.4   BMI 31.11 kg/m2 31.72 kg/m2     Wt Readings from Last 3 Encounters:  02/26/23 235 lb 12.8 oz (107 kg)  10/24/22 240 lb 6.4 oz (109 kg)  08/25/22 220 lb (99.8 kg)      Physical Exam Constitutional:      Appearance: He is well-developed.  Cardiovascular:     Rate and  Rhythm: Normal rate.     Heart sounds: Normal heart sounds. No murmur heard. Pulmonary:     Effort: Pulmonary effort is normal.     Breath sounds: Normal breath sounds. No wheezing or rales.  Chest:     Chest wall: No tenderness.  Abdominal:     General: Bowel sounds are normal. There is no distension.     Palpations: Abdomen is soft. There is no mass.     Tenderness: There is no abdominal tenderness.  Musculoskeletal:        General: Normal range of motion.  Right lower leg: No edema.     Left lower leg: No edema.  Neurological:     Mental Status: He is alert and oriented to person, place, and time.  Psychiatric:        Mood and Affect: Mood normal.        Latest Ref Rng & Units 10/24/2022   11:11 AM 07/10/2022    1:53 PM 01/02/2022   10:58 AM  CMP  Glucose 70 - 99 mg/dL 98  872  894   BUN 6 - 20 mg/dL 39  29  38   Creatinine 0.76 - 1.27 mg/dL 8.16  8.23  7.89   Sodium 134 - 144 mmol/L 141  141  137   Potassium 3.5 - 5.2 mmol/L 4.9  4.8  4.7   Chloride 96 - 106 mmol/L 103  104  99   CO2 20 - 29 mmol/L 23  22  20    Calcium  8.7 - 10.2 mg/dL 9.5  9.0  9.4   Total Protein 6.0 - 8.5 g/dL 8.0  7.9  8.4   Total Bilirubin 0.0 - 1.2 mg/dL 0.4  0.3  0.5   Alkaline Phos 44 - 121 IU/L 145  154  138   AST 0 - 40 IU/L 13  18  17    ALT 0 - 44 IU/L 13  20  18      Lipid Panel     Component Value Date/Time   CHOL 140 07/10/2022 1353   TRIG 114 07/10/2022 1353   HDL 40 07/10/2022 1353   CHOLHDL 3.1 06/12/2021 0915   CHOLHDL 3.8 04/12/2017 0036   VLDL 18 04/12/2017 0036   LDLCALC 79 07/10/2022 1353    CBC    Component Value Date/Time   WBC 5.6 06/12/2021 0915   WBC 6.7 05/04/2021 1354   RBC 5.27 06/12/2021 0915   RBC 4.76 05/04/2021 1354   HGB 14.8 06/12/2021 0915   HCT 45.1 06/12/2021 0915   PLT 241 06/12/2021 0915   MCV 86 06/12/2021 0915   MCH 28.1 06/12/2021 0915   MCH 28.6 05/04/2021 1354   MCHC 32.8 06/12/2021 0915   MCHC 32.5 05/04/2021 1354   RDW 14.2  06/12/2021 0915   LYMPHSABS 2.7 06/12/2021 0915   MONOABS 0.8 05/04/2021 1354   EOSABS 0.3 06/12/2021 0915   BASOSABS 0.1 06/12/2021 0915    Lab Results  Component Value Date   HGBA1C 7.1 (A) 02/26/2023    Assessment & Plan:      Type 2 Diabetes Mellitus Good control with home glucose readings in the low 100s to 160s postprandially. A1c 7.1. Tolerating Ozempic  1mg  well. -Continue current regimen. -Counseled on Diabetic diet, my plate method, 849 minutes of moderate intensity exercise/week Blood sugar logs with fasting goals of 80-120 mg/dl, random of less than 819 and in the event of sugars less than 60 mg/dl or greater than 599 mg/dl encouraged to notify the clinic. Advised on the need for annual eye exams, annual foot exams, Pneumonia vaccine. -Check A1c in 6 months.  Hypertension Well controlled with BP 99/67. No symptoms of hypotension. On Amlodipine  10mg  and Lisinopril -Hydrochlorothiazide . -Continue current regimen.  Stage 3 Chronic Kidney Disease Fluctuating kidney function, last creatinine was 1.83. No current nephrology follow-up. -He was referred but nephrology could not reach him to schedule an appointment.  I have provided him with the number to [facility] to schedule an appointment. -Order labs to assess current kidney function.   Hyperlipidemia Continue statin Low-cholesterol diet  General Health Maintenance -Continue weight loss efforts. -Ensure ophthalmology reports are faxed to primary care office. -Follow-up in 6 months.          Meds ordered this encounter  Medications   amLODipine  (NORVASC ) 10 MG tablet    Sig: Take 1 tablet (10 mg total) by mouth daily.    Dispense:  90 tablet    Refill:  1   atorvastatin  (LIPITOR) 20 MG tablet    Sig: Take 1 tablet (20 mg total) by mouth daily.    Dispense:  90 tablet    Refill:  1   empagliflozin  (JARDIANCE ) 25 MG TABS tablet    Sig: Take 1 tablet (25 mg total) by mouth daily before  breakfast.    Dispense:  90 tablet    Refill:  1   insulin  isophane & regular human KwikPen (HUMULIN  70/30 KWIKPEN) (70-30) 100 UNIT/ML KwikPen    Sig: Inject 32 Units into the skin in the morning and at bedtime.    Dispense:  45 mL    Refill:  6   lisinopril -hydrochlorothiazide  (ZESTORETIC ) 20-25 MG tablet    Sig: Take 1 tablet by mouth daily.    Dispense:  90 tablet    Refill:  1   Semaglutide , 1 MG/DOSE, 4 MG/3ML SOPN    Sig: Inject 1 mg as directed once a week.    Dispense:  12 mL    Refill:  1    Please provide 1mg  per dose!!!!    Follow-up: Return in about 6 months (around 08/26/2023) for Chronic medical conditions.       Corrina Sabin, MD, FAAFP. [facility] [place], [place] [phone]   02/26/2023, 9:15 AM

## 2023-02-26 NOTE — Patient Instructions (Signed)
Sent referral to [facility]  Ph. # [phone] Address [address] [place] [address] .They will contact the patient to schedule an appointment.

## 2023-02-27 LAB — CMP14+EGFR
ALT: 19 [IU]/L (ref 0–44)
AST: 17 [IU]/L (ref 0–40)
Albumin: 4.6 g/dL (ref 4.1–5.1)
Alkaline Phosphatase: 141 [IU]/L — ABNORMAL HIGH (ref 44–121)
BUN/Creatinine Ratio: 20 (ref 9–20)
BUN: 39 mg/dL — ABNORMAL HIGH (ref 6–20)
Bilirubin Total: 0.4 mg/dL (ref 0.0–1.2)
CO2: 23 mmol/L (ref 20–29)
Calcium: 9.4 mg/dL (ref 8.7–10.2)
Chloride: 103 mmol/L (ref 96–106)
Creatinine, Ser: 1.92 mg/dL — ABNORMAL HIGH (ref 0.76–1.27)
Globulin, Total: 3.3 g/dL (ref 1.5–4.5)
Glucose: 64 mg/dL — ABNORMAL LOW (ref 70–99)
Potassium: 4.4 mmol/L (ref 3.5–5.2)
Sodium: 140 mmol/L (ref 134–144)
Total Protein: 7.9 g/dL (ref 6.0–8.5)
eGFR: 45 mL/min/{1.73_m2} — ABNORMAL LOW (ref >59)

## 2023-05-03 ENCOUNTER — Telehealth: Payer: Self-pay

## 2023-05-03 ENCOUNTER — Other Ambulatory Visit: Payer: Self-pay

## 2023-05-03 NOTE — Telephone Encounter (Signed)
 Pharmacy Patient Advocate Encounter   Received notification from CoverMyMeds that prior authorization for [facility] is required/requested.   Insurance verification completed.   The patient is insured through [employer] .   Per test claim: [id number]

## 2023-05-06 ENCOUNTER — Telehealth: Payer: Self-pay

## 2023-05-06 ENCOUNTER — Other Ambulatory Visit: Payer: Self-pay

## 2023-05-06 NOTE — Telephone Encounter (Signed)
 Pharmacy Patient Advocate Encounter  Received notification from [employer] [facility] that Prior Authorization for [facility] has been APPROVED from 05/03/2023 to 05/02/2024   PA #/Case ID/Reference #: [id number]  UXLKGMW NOTIFIED AND MYCHART MESSAGE SENT TO PATIENT.

## 2023-05-29 ENCOUNTER — Other Ambulatory Visit: Payer: Self-pay

## 2023-05-29 ENCOUNTER — Telehealth: Payer: Self-pay

## 2023-05-29 NOTE — Telephone Encounter (Signed)
 Copied from CRM [id number]. Topic: Referral - Prior Authorization Question >> May 29, 2023  3:25 PM Eunice Blase wrote: Reason for CRM: Pt needs prior authorization for empagliflozin (JARDIANCE) 25 MG TABS tablet. Please send to [facility] pharmacy on [address] Ph: [phone]. Please call [phone] pt when authorization acquired.

## 2023-05-29 NOTE — Telephone Encounter (Signed)
Routing to Kelly.  ?

## 2023-07-21 ENCOUNTER — Other Ambulatory Visit: Payer: Self-pay | Admitting: Family Medicine

## 2023-07-21 DIAGNOSIS — E1169 Type 2 diabetes mellitus with other specified complication: Secondary | ICD-10-CM

## 2023-07-21 DIAGNOSIS — Z794 Long term (current) use of insulin: Secondary | ICD-10-CM

## 2023-07-21 DIAGNOSIS — Z7985 Long-term (current) use of injectable non-insulin antidiabetic drugs: Secondary | ICD-10-CM

## 2023-07-22 NOTE — Telephone Encounter (Signed)
 Requested Prescriptions  Pending Prescriptions Disp Refills   OZEMPIC , 1 MG/DOSE, 4 MG/3ML SOPN [Pharmacy Med Name: Ozempic  (1 MG/DOSE) 4 MG/3ML Subcutaneous Solution Pen-injector] 3 mL 0    Sig: INJECT 1 MG SUBCUTANEOUSLY ONCE A WEEK     Endocrinology:  Diabetes - GLP-1 Receptor Agonists - semaglutide  Failed - 07/22/2023  4:16 PM      Failed - HBA1C in normal range and within 180 days    HbA1c, POC (controlled diabetic range)  Date Value Ref Range Status  02/26/2023 7.1 (A) 0.0 - 7.0 % Final         Failed - Cr in normal range and within 360 days    Creatinine, Ser  Date Value Ref Range Status  02/26/2023 1.92 (H) 0.76 - 1.27 mg/dL Final         Passed - Valid encounter within last 6 months    Recent Outpatient Visits           4 months ago Hyperlipidemia associated with type 2 diabetes mellitus (HCC)   [place] [facility] [place]. [facility] Joaquin Mulberry, MD   9 months ago Type 2 diabetes mellitus with hyperglycemia, unspecified whether long term insulin  use [facility])   [place] [facility] Dept Of [place]. [facility] [place], [place], [place]   1 year ago Drug-induced constipation   [place] [facility] Of [place]. [facility] Joaquin Mulberry, MD   1 year ago Type 2 diabetes mellitus with other specified complication, with long-term current use of insulin  [facility])    [facility] [place]. [facility] [place], [place], [place]   1 year ago Type 2 diabetes mellitus with other specified complication, with long-term current use of insulin  [facility])    [facility] Of [place]. [facility] Joaquin Mulberry, MD       Future Appointments             In 1 month Joaquin Mulberry, MD [facility] - [facility] Dept Of [place]. [facility]

## 2023-08-21 ENCOUNTER — Other Ambulatory Visit: Payer: Self-pay | Admitting: Family Medicine

## 2023-08-21 DIAGNOSIS — Z7985 Long-term (current) use of injectable non-insulin antidiabetic drugs: Secondary | ICD-10-CM

## 2023-08-21 DIAGNOSIS — E1169 Type 2 diabetes mellitus with other specified complication: Secondary | ICD-10-CM

## 2023-08-26 ENCOUNTER — Encounter: Payer: Self-pay | Admitting: Family Medicine

## 2023-08-26 ENCOUNTER — Ambulatory Visit: Payer: Medicaid - Out of State | Attending: Family Medicine | Admitting: Family Medicine

## 2023-08-26 VITALS — BP 111/77 | HR 106 | Ht 73.0 in | Wt 240.0 lb

## 2023-08-26 DIAGNOSIS — Z7984 Long term (current) use of oral hypoglycemic drugs: Secondary | ICD-10-CM | POA: Diagnosis not present

## 2023-08-26 DIAGNOSIS — N1831 Chronic kidney disease, stage 3a: Secondary | ICD-10-CM

## 2023-08-26 DIAGNOSIS — E1169 Type 2 diabetes mellitus with other specified complication: Secondary | ICD-10-CM

## 2023-08-26 DIAGNOSIS — Z794 Long term (current) use of insulin: Secondary | ICD-10-CM

## 2023-08-26 DIAGNOSIS — E1122 Type 2 diabetes mellitus with diabetic chronic kidney disease: Secondary | ICD-10-CM

## 2023-08-26 DIAGNOSIS — Z7985 Long-term (current) use of injectable non-insulin antidiabetic drugs: Secondary | ICD-10-CM

## 2023-08-26 DIAGNOSIS — I129 Hypertensive chronic kidney disease with stage 1 through stage 4 chronic kidney disease, or unspecified chronic kidney disease: Secondary | ICD-10-CM

## 2023-08-26 DIAGNOSIS — L84 Corns and callosities: Secondary | ICD-10-CM

## 2023-08-26 DIAGNOSIS — E785 Hyperlipidemia, unspecified: Secondary | ICD-10-CM

## 2023-08-26 LAB — POCT GLYCOSYLATED HEMOGLOBIN (HGB A1C): HbA1c, POC (controlled diabetic range): 7.9 % — AB (ref 0.0–7.0)

## 2023-08-26 MED ORDER — AMLODIPINE BESYLATE 10 MG PO TABS: 10.0000 mg | ORAL_TABLET | Freq: Every day | ORAL | 1 refills | Status: DC

## 2023-08-26 MED ORDER — ATORVASTATIN CALCIUM 20 MG PO TABS: 20.0000 mg | ORAL_TABLET | Freq: Every day | ORAL | 1 refills | Status: DC

## 2023-08-26 MED ORDER — LISINOPRIL-HYDROCHLOROTHIAZIDE 20-25 MG PO TABS: 1.0000 | ORAL_TABLET | Freq: Every day | ORAL | 1 refills | Status: DC

## 2023-08-26 MED ORDER — EMPAGLIFLOZIN 25 MG PO TABS: 25.0000 mg | ORAL_TABLET | Freq: Every day | ORAL | 1 refills | Status: DC

## 2023-08-26 MED ORDER — OZEMPIC (0.25 OR 0.5 MG/DOSE) 2 MG/3ML ~~LOC~~ SOPN: 0.5000 mg | PEN_INJECTOR | SUBCUTANEOUS | 6 refills | Status: DC

## 2023-08-26 NOTE — Patient Instructions (Signed)
 VISIT SUMMARY:  During your visit, we discussed your type 2 diabetes management, issues with Ozempic , chronic kidney disease, hypertension, hyperlipidemia, and foot care. We reviewed your current medications and made some adjustments to better manage your conditions.  YOUR PLAN:  -TYPE 2 DIABETES MELLITUS: Type 2 diabetes is a condition where your body does not use insulin  properly, leading to high blood sugar levels. We will reduce your Ozempic  dose to 0.5 mg weekly to help with nausea and increase your Humulin  70/30 to 32 units twice daily. Continue taking Jardiance  25 mg daily. Be aware of potential weight gain due to the reduced Ozempic  dose. Please monitor your blood glucose levels and A1c regularly.  -CHRONIC KIDNEY DISEASE STAGE 3: Chronic kidney disease stage 3 means your kidneys are moderately damaged and not working as well as they should. We will order blood work to assess your current kidney function and may refer you to a kidney specialist based on the results.  -HYPERTENSION: Hypertension, or high blood pressure, is when the force of your blood against your artery walls is too high. Your blood pressure is well-controlled with your current medications, so please continue taking amlodipine , lisinopril , and hydrochlorothiazide  as prescribed.  -HYPERLIPIDEMIA: Hyperlipidemia is when you have high levels of fats (lipids) in your blood, which can increase your risk of heart disease. You are currently taking atorvastatin , and we will order labs to check your cholesterol levels.  -FOOT CARE: Due to your history of foot issues, including a previous toe amputation, calluses, and corns, it is important to take good care of your feet. We will refer you to a podiatrist for specialized foot care and management of calluses and corns.  INSTRUCTIONS:  Please schedule a follow-up appointment in 3 months to monitor your diabetes management and overall health. We will also need to review the results of  your blood work and cholesterol levels. Additionally, we will request records of your recent eye exam from [facility].

## 2023-08-26 NOTE — Progress Notes (Addendum)
 Subjective:  Patient ID: Jerry Arnold, male    DOB: Nov 12, 1985  Age: 38 y.o. MRN: [id number]  CC: Medical Management of Chronic Issues (Discuss Ozempic )     Discussed the use of AI scribe software for clinical note transcription with the patient, who gave verbal consent to proceed.  History of Present Illness Jerry Arnold is a 38 year old male with a history of Type 2 DM (A1c 7.1), HTN, hyperlipidemia, status post right second toe amputation in 03/2017, Stage 3 CKD   who presents with issues related to Ozempic  use.  He experiences nausea after taking Ozempic , which is less severe than with Trulicity . Due to nausea, he is inconsistent with Ozempic , leading to an increase in A1c from 7.1 in January to 7.9 currently. He is on a 1 mg dose of Ozempic  weekly but did not experience nausea on the 0.5 mg dose.  Up-to-date on eye exam with last eye exam in 05/2023 with an ophthalmologist in [place] . He has a history of a second toe amputation and very dry feet, using diabetic lotion.  He was seen by podiatry over a year ago.  He manages diabetes with Jardiance  25 mg daily and Humulin  70/30, taking 29 units in the morning and evening (rather than 32 units twice daily on his med list). He also has hypertension, treated with amlodipine , lisinopril , and hydrochlorothiazide , and stage 3 chronic kidney disease with previous abnormal creatinine levels.  Currently not under the care of nephrology.  He takes atorvastatin  daily for cholesterol management, with the last check in May 2024. He has regular bowel movements with probiotic fiber pills and denies constipation.     Past Medical History:  Diagnosis Date   Hypertension    Osteomyelitis (HCC) 04/11/2017   with second toe on right foot injury x 2-3 weeks (04/11/2017)   Type II diabetes mellitus (HCC)     Past Surgical History:  Procedure Laterality Date   AMPUTATION Right 04/13/2017   Procedure: SECOND FOOT RAY AMPUTATION;  Surgeon: Harden Jerona GAILS, MD;  Location: [facility] OR;  Service: Orthopedics;  Laterality: Right;   NO PAST SURGERIES      Family History  Problem Relation Age of Onset   Hypertension Mother    Diabetes Mother    Hypertension Maternal Grandmother    Hypertension Paternal Grandmother     Social History   Socioeconomic History   Marital status: Single    Spouse name: Not on file   Number of children: Not on file   Years of education: Not on file   Highest education level: Not on file  Occupational History   Not on file  Tobacco Use   Smoking status: Never   Smokeless tobacco: Never  Vaping Use   Vaping status: Never Used  Substance and Sexual Activity   Alcohol use: Yes    Comment: 04/11/2017 might have a few drinks/year   Drug use: No   Sexual activity: Yes  Other Topics Concern   Not on file  Social History Narrative   Not on file   Social Drivers of Health   Financial Resource Strain: Not on file  Food Insecurity: Not on file  Transportation Needs: Not on file  Physical Activity: Not on file  Stress: Not on file  Social Connections: Not on file    No Known Allergies  Outpatient Medications Prior to Visit  Medication Sig Dispense Refill   Accu-Chek Softclix Lancets lancets Check blood sugar once daily. 100 each 2  acetaminophen  (TYLENOL ) 500 MG tablet Take 1 tablet (500 mg total) by mouth every 6 (six) hours as needed. 30 tablet 0   Blood Glucose Monitoring Suppl (ACCU-CHEK GUIDE) w/Device KIT Check blood sugar once daily. 1 kit 0   glucose blood (ACCU-CHEK GUIDE) test strip Check blood sugar once daily. 100 each 2   insulin  isophane & regular human KwikPen (HUMULIN  70/30 KWIKPEN) (70-30) 100 UNIT/ML KwikPen Inject 32 Units into the skin in the morning and at bedtime. 45 mL 6   Insulin  Pen Needle (TRUEPLUS PEN NEEDLES) 32G X 4 MM MISC Use as directed to inject Trulicity  100 each 11   Insulin  Syringe-Needle U-100 (TRUEPLUS INSULIN  SYRINGE) 31G X 5/16 0.3 ML MISC Use as directed to  administer insulin  twice daily 100 each 3   amLODipine  (NORVASC ) 10 MG tablet Take 1 tablet (10 mg total) by mouth daily. 90 tablet 1   atorvastatin  (LIPITOR) 20 MG tablet Take 1 tablet (20 mg total) by mouth daily. 90 tablet 1   empagliflozin  (JARDIANCE ) 25 MG TABS tablet Take 1 tablet (25 mg total) by mouth daily before breakfast. 90 tablet 1   lisinopril -hydrochlorothiazide  (ZESTORETIC ) 20-25 MG tablet Take 1 tablet by mouth daily. 90 tablet 1   OZEMPIC , 1 MG/DOSE, 4 MG/3ML SOPN INJECT 1 MG SUBCUTANEOUSLY ONCE A WEEK 3 mL 0   cyclobenzaprine  (FLEXERIL ) 10 MG tablet Take 1 tablet (10 mg total) by mouth 2 (two) times daily as needed for muscle spasms. (Patient not taking: Reported on 08/26/2023) 20 tablet 0   No facility-administered medications prior to visit.     ROS Review of Systems  Constitutional:  Negative for activity change and appetite change.  HENT:  Negative for sinus pressure and sore throat.   Respiratory:  Negative for chest tightness, shortness of breath and wheezing.   Cardiovascular:  Negative for chest pain and palpitations.  Gastrointestinal:  Positive for nausea. Negative for abdominal distention, abdominal pain and constipation. Blood in stool: .SABRA Genitourinary: Negative.   Musculoskeletal: Negative.   Psychiatric/Behavioral:  Negative for behavioral problems and dysphoric mood.     Objective:  BP 111/77   Pulse (!) 106   Ht 6' 1 (1.854 m)   Wt 240 lb (108.9 kg)   SpO2 99%   BMI 31.66 kg/m      08/26/2023    9:22 AM 02/26/2023    8:54 AM 10/24/2022    9:58 AM  BP/Weight  Systolic BP 111 99 103  Diastolic BP 77 67 70  Wt. (Lbs) 240 235.8 240.4  BMI 31.66 kg/m2 31.11 kg/m2 31.72 kg/m2      Physical Exam Constitutional:      Appearance: He is well-developed.  Cardiovascular:     Rate and Rhythm: Tachycardia present.     Heart sounds: Normal heart sounds. No murmur heard. Pulmonary:     Effort: Pulmonary effort is normal.     Breath sounds: Normal  breath sounds. No wheezing or rales.  Chest:     Chest wall: No tenderness.  Abdominal:     General: Bowel sounds are normal. There is no distension.     Palpations: Abdomen is soft. There is no mass.     Tenderness: There is no abdominal tenderness.  Musculoskeletal:        General: Normal range of motion.     Right lower leg: No edema.     Left lower leg: No edema.  Neurological:     Mental Status: He is alert and oriented to person,  place, and time.  Psychiatric:        Mood and Affect: Mood normal.    Diabetic Foot Exam - Simple   Simple Foot Form Diabetic Foot exam was performed with the following findings: Yes 08/26/2023  9:36 AM  Visual Inspection See comments: Yes Sensation Testing Intact to touch and monofilament testing bilaterally: Yes Pulse Check Posterior Tibialis and Dorsalis pulse intact bilaterally: Yes Comments Right second toe amputation.  Hammertoes bilaterally.  Calluses on anterior aspect of  sole of right foot One callus on lateral aspect of sole of left foot        Latest Ref Rng & Units 02/26/2023    9:24 AM 10/24/2022   11:11 AM 07/10/2022    1:53 PM  CMP  Glucose 70 - 99 mg/dL 64  98  872   BUN 6 - 20 mg/dL 39  39  29   Creatinine 0.76 - 1.27 mg/dL 8.07  8.16  8.23   Sodium 134 - 144 mmol/L 140  141  141   Potassium 3.5 - 5.2 mmol/L 4.4  4.9  4.8   Chloride 96 - 106 mmol/L 103  103  104   CO2 20 - 29 mmol/L 23  23  22    Calcium  8.7 - 10.2 mg/dL 9.4  9.5  9.0   Total Protein 6.0 - 8.5 g/dL 7.9  8.0  7.9   Total Bilirubin 0.0 - 1.2 mg/dL 0.4  0.4  0.3   Alkaline Phos 44 - 121 IU/L 141  145  154   AST 0 - 40 IU/L 17  13  18    ALT 0 - 44 IU/L 19  13  20      Lipid Panel     Component Value Date/Time   CHOL 140 07/10/2022 1353   TRIG 114 07/10/2022 1353   HDL 40 07/10/2022 1353   CHOLHDL 3.1 06/12/2021 0915   CHOLHDL 3.8 04/12/2017 0036   VLDL 18 04/12/2017 0036   LDLCALC 79 07/10/2022 1353    CBC    Component Value Date/Time   WBC 5.6  06/12/2021 0915   WBC 6.7 05/04/2021 1354   RBC 5.27 06/12/2021 0915   RBC 4.76 05/04/2021 1354   HGB 14.8 06/12/2021 0915   HCT 45.1 06/12/2021 0915   PLT 241 06/12/2021 0915   MCV 86 06/12/2021 0915   MCH 28.1 06/12/2021 0915   MCH 28.6 05/04/2021 1354   MCHC 32.8 06/12/2021 0915   MCHC 32.5 05/04/2021 1354   RDW 14.2 06/12/2021 0915   LYMPHSABS 2.7 06/12/2021 0915   MONOABS 0.8 05/04/2021 1354   EOSABS 0.3 06/12/2021 0915   BASOSABS 0.1 06/12/2021 0915    Lab Results  Component Value Date   HGBA1C 7.9 (A) 08/26/2023     Lab Results  Component Value Date   HGBA1C 7.9 (A) 08/26/2023   HGBA1C 7.1 (A) 02/26/2023   HGBA1C 7.6 (A) 10/24/2022       Assessment & Plan Type 2 Diabetes Mellitus with other specified complication with long-term insulin  use Nausea with 1 mg Ozempic  causing inconsistent use. A1c increased from 7.1% to 7.9%. - Reduce Ozempic  dose to 0.5 mg weekly. - Increase Humulin  70/30 to 32 units twice daily (as he has been taking 29 units twice daily) - Continue Jardiance  25 mg daily. - Educated on potential weight gain due to reduced Ozempic  dose. - Monitor blood glucose levels and A1c. -Counseled on Diabetic diet, the healthy plate, 849 minutes of moderate intensity exercise/week Blood sugar logs  with fasting goals of 80-120 mg/dl, random of less than 819 and in the event of sugars less than 60 mg/dl or greater than 599 mg/dl encouraged to notify the clinic. Advised on the need for annual eye exams, annual foot exams, Pneumonia vaccine.   Hypertension associated with stage III chronic kidney disease secondary to type 2 diabetes mellitus Blood pressure well-controlled with current regimen. - Continue current antihypertensive medications: amlodipine , lisinopril , and hydrochlorothiazide . -Stage 3 CKD with previously abnormal creatinine levels. Last test in January. - Order blood work to assess current kidney function. - Consider referral to a nephrologist  based on blood work results.   Hyperlipidemia associated with type 2 diabetes mellitus On atorvastatin . Last cholesterol check in May 2024. - Order labs to check cholesterol levels.  Callus History of foot issues, including previous toe amputation, calluses, and corns. Reports very dry feet. - Make a referral to a podiatrist for foot care and management of calluses and corns.  General Health Maintenance Had an eye exam on May 21, 2023, at [facility]. - Request records of the recent eye exam from [place], [place].  Follow-up Needs follow-up to assess diabetes management and ongoing health issues. - Schedule follow-up appointment in 3 months to monitor diabetes management and overall health.    Meds ordered this encounter  Medications   amLODipine  (NORVASC ) 10 MG tablet    Sig: Take 1 tablet (10 mg total) by mouth daily.    Dispense:  90 tablet    Refill:  1   atorvastatin  (LIPITOR) 20 MG tablet    Sig: Take 1 tablet (20 mg total) by mouth daily.    Dispense:  90 tablet    Refill:  1   empagliflozin  (JARDIANCE ) 25 MG TABS tablet    Sig: Take 1 tablet (25 mg total) by mouth daily before breakfast.    Dispense:  90 tablet    Refill:  1   lisinopril -hydrochlorothiazide  (ZESTORETIC ) 20-25 MG tablet    Sig: Take 1 tablet by mouth daily.    Dispense:  90 tablet    Refill:  1   Semaglutide ,0.25 or 0.5MG /DOS, (OZEMPIC , 0.25 OR 0.5 MG/DOSE,) 2 MG/3ML SOPN    Sig: Inject 0.5 mg into the skin once a week.    Dispense:  3 mL    Refill:  6    Dose decrease    Follow-up: Return in about 3 months (around 11/26/2023) for Chronic medical conditions.       Corrina Sabin, MD, FAAFP. [facility] [place], [place] [phone]   08/26/2023, 2:22 PM

## 2023-08-27 ENCOUNTER — Ambulatory Visit: Payer: Self-pay | Admitting: Family Medicine

## 2023-08-27 DIAGNOSIS — R748 Abnormal levels of other serum enzymes: Secondary | ICD-10-CM

## 2023-08-28 ENCOUNTER — Other Ambulatory Visit

## 2023-08-28 LAB — LP+NON-HDL CHOLESTEROL
Cholesterol, Total: 148 mg/dL (ref 100–199)
HDL: 43 mg/dL (ref >39)
LDL Chol Calc (NIH): 86 mg/dL (ref 0–99)
Total Non-HDL-Chol (LDL+VLDL): 105 mg/dL (ref 0–129)
Triglycerides: 106 mg/dL (ref 0–149)
VLDL Cholesterol Cal: 19 mg/dL (ref 5–40)

## 2023-08-28 LAB — CMP14+EGFR
ALT: 13 IU/L (ref 0–44)
AST: 14 IU/L (ref 0–40)
Albumin: 4.5 g/dL (ref 4.1–5.1)
Alkaline Phosphatase: 152 IU/L — ABNORMAL HIGH (ref 44–121)
BUN/Creatinine Ratio: 13 (ref 9–20)
BUN: 26 mg/dL — ABNORMAL HIGH (ref 6–20)
Bilirubin Total: 0.5 mg/dL (ref 0.0–1.2)
CO2: 19 mmol/L — ABNORMAL LOW (ref 20–29)
Calcium: 9.6 mg/dL (ref 8.7–10.2)
Chloride: 102 mmol/L (ref 96–106)
Creatinine, Ser: 1.98 mg/dL — ABNORMAL HIGH (ref 0.76–1.27)
Globulin, Total: 3.6 g/dL (ref 1.5–4.5)
Glucose: 163 mg/dL — ABNORMAL HIGH (ref 70–99)
Potassium: 5.1 mmol/L (ref 3.5–5.2)
Sodium: 139 mmol/L (ref 134–144)
Total Protein: 8.1 g/dL (ref 6.0–8.5)
eGFR: 44 mL/min/1.73 — ABNORMAL LOW (ref >59)

## 2023-08-28 LAB — MICROALBUMIN / CREATININE URINE RATIO
Creatinine, Urine: 103.3 mg/dL
Microalb/Creat Ratio: 28 mg/g{creat} (ref 0–29)
Microalbumin, Urine: 29 ug/mL

## 2023-09-20 ENCOUNTER — Other Ambulatory Visit: Payer: Self-pay | Admitting: Family Medicine

## 2023-09-20 DIAGNOSIS — E1169 Type 2 diabetes mellitus with other specified complication: Secondary | ICD-10-CM

## 2023-09-20 DIAGNOSIS — Z7985 Long-term (current) use of injectable non-insulin antidiabetic drugs: Secondary | ICD-10-CM

## 2023-11-21 ENCOUNTER — Telehealth: Payer: Self-pay | Admitting: Family Medicine

## 2023-11-21 NOTE — Telephone Encounter (Signed)
 Called pt to confirm appt. Pt did not answer and lvm

## 2023-11-26 ENCOUNTER — Ambulatory Visit: Admitting: Family Medicine

## 2024-01-06 ENCOUNTER — Ambulatory Visit: Payer: Self-pay | Admitting: Family Medicine

## 2024-02-02 ENCOUNTER — Other Ambulatory Visit: Payer: Self-pay | Admitting: Family Medicine

## 2024-02-02 DIAGNOSIS — N1831 Chronic kidney disease, stage 3a: Secondary | ICD-10-CM

## 2024-03-03 ENCOUNTER — Ambulatory Visit: Attending: Family Medicine | Admitting: Family Medicine

## 2024-03-03 ENCOUNTER — Encounter: Payer: Self-pay | Admitting: Family Medicine

## 2024-03-03 ENCOUNTER — Other Ambulatory Visit: Payer: Self-pay

## 2024-03-03 VITALS — BP 112/76 | HR 98 | Temp 98.9°F | Ht 73.0 in | Wt 260.0 lb

## 2024-03-03 DIAGNOSIS — N1831 Chronic kidney disease, stage 3a: Secondary | ICD-10-CM

## 2024-03-03 DIAGNOSIS — E1122 Type 2 diabetes mellitus with diabetic chronic kidney disease: Secondary | ICD-10-CM

## 2024-03-03 DIAGNOSIS — E11621 Type 2 diabetes mellitus with foot ulcer: Secondary | ICD-10-CM | POA: Diagnosis not present

## 2024-03-03 DIAGNOSIS — Z23 Encounter for immunization: Secondary | ICD-10-CM

## 2024-03-03 DIAGNOSIS — L97521 Non-pressure chronic ulcer of other part of left foot limited to breakdown of skin: Secondary | ICD-10-CM | POA: Diagnosis not present

## 2024-03-03 DIAGNOSIS — Z7985 Long-term (current) use of injectable non-insulin antidiabetic drugs: Secondary | ICD-10-CM | POA: Diagnosis not present

## 2024-03-03 DIAGNOSIS — I129 Hypertensive chronic kidney disease with stage 1 through stage 4 chronic kidney disease, or unspecified chronic kidney disease: Secondary | ICD-10-CM

## 2024-03-03 DIAGNOSIS — E1165 Type 2 diabetes mellitus with hyperglycemia: Secondary | ICD-10-CM

## 2024-03-03 DIAGNOSIS — E785 Hyperlipidemia, unspecified: Secondary | ICD-10-CM | POA: Diagnosis not present

## 2024-03-03 DIAGNOSIS — Z794 Long term (current) use of insulin: Secondary | ICD-10-CM

## 2024-03-03 DIAGNOSIS — Z7984 Long term (current) use of oral hypoglycemic drugs: Secondary | ICD-10-CM

## 2024-03-03 DIAGNOSIS — L84 Corns and callosities: Secondary | ICD-10-CM

## 2024-03-03 DIAGNOSIS — E1169 Type 2 diabetes mellitus with other specified complication: Secondary | ICD-10-CM

## 2024-03-03 LAB — POCT GLYCOSYLATED HEMOGLOBIN (HGB A1C): HbA1c, POC (controlled diabetic range): 11.5 % — AB (ref 0.0–7.0)

## 2024-03-03 MED ORDER — TIRZEPATIDE 7.5 MG/0.5ML ~~LOC~~ SOAJ: 7.5000 mg | SUBCUTANEOUS | 0 refills | Status: AC

## 2024-03-03 MED ORDER — AMLODIPINE BESYLATE 10 MG PO TABS: 10.0000 mg | ORAL_TABLET | Freq: Every day | ORAL | 0 refills | Status: AC

## 2024-03-03 MED ORDER — LISINOPRIL-HYDROCHLOROTHIAZIDE 20-25 MG PO TABS: 1.0000 | ORAL_TABLET | Freq: Every day | ORAL | 1 refills | Status: AC

## 2024-03-03 MED ORDER — EMPAGLIFLOZIN 25 MG PO TABS: 25.0000 mg | ORAL_TABLET | Freq: Every day | ORAL | 1 refills | Status: AC

## 2024-03-03 MED ORDER — ATORVASTATIN CALCIUM 20 MG PO TABS: 20.0000 mg | ORAL_TABLET | Freq: Every day | ORAL | 1 refills | Status: AC

## 2024-03-03 MED ORDER — TIRZEPATIDE 5 MG/0.5ML ~~LOC~~ SOAJ: 5.0000 mg | SUBCUTANEOUS | 6 refills | Status: AC

## 2024-03-03 MED ORDER — HUMULIN 70/30 KWIKPEN (70-30) 100 UNIT/ML ~~LOC~~ SUPN: 32.0000 [IU] | PEN_INJECTOR | Freq: Two times a day (BID) | SUBCUTANEOUS | 6 refills | Status: AC

## 2024-03-03 MED ORDER — TIRZEPATIDE 10 MG/0.5ML ~~LOC~~ SOAJ: 10.0000 mg | SUBCUTANEOUS | 0 refills | Status: AC

## 2024-03-03 NOTE — Patient Instructions (Signed)
 VISIT SUMMARY:  During today's visit, we discussed your diabetes management, hypertension, and a sore on your foot. We reviewed your current medications and made some adjustments to improve your diabetes control. We also addressed your difficulty accessing nephrology services and provided a referral to a specialist in [place] . Additionally, we provided care instructions for the sore on your foot and referred you to a podiatrist.  YOUR PLAN:  -TYPE 2 DIABETES MELLITUS WITH HYPERGLYCEMIA AND OTHER SPECIFIED COMPLICATIONS: Your blood sugar levels have been high, which is why your A1c increased to 11.5. We switched your medication from Ozempic  to Mounjaro  5 mg weekly, as you had a better response to Mounjaro  previously. Please monitor for any side effects and adjust the dose if necessary. Continue taking Humulin  70/30 at 32 units twice daily. We also ordered a comprehensive metabolic panel to check your overall health.  -HYPERTENSION ASSOCIATED WITH STAGE 3A CHRONIC KIDNEY DISEASE DUE TO TYPE 2 DIABETES MELLITUS: Your blood pressure is well-controlled with your current medications. However, you have had difficulty accessing nephrology services. Continue taking your current medications: amlodipine  and lisinopril -hydrochlorothiazide . We have referred you to a nephrologist in [place]  to help manage your kidney health.  -DIABETIC FOOT ULCER WITH SKIN BREAKDOWN: You have a sore on your foot that developed after peeling a callus. It is important to keep the sore clean and perform warm soaks with salt. We have referred you to a podiatrist for further care. Please monitor the sore for any signs of infection, such as increased redness, swelling, or drainage.  INSTRUCTIONS:  Please follow up with the nephrologist in [place]  as soon as possible. Continue monitoring your blood sugar levels and blood pressure regularly. Schedule an appointment with the podiatrist for your foot sore. If you  experience any side effects from the new diabetes medication or notice signs of infection in the sore on your foot, contact our office immediately.

## 2024-03-03 NOTE — Progress Notes (Signed)
 "  Subjective:  Patient ID: Jerry Arnold, male    DOB: 1985-03-13  Age: 39 y.o. MRN: [id number]  CC: Medical Management of Chronic Issues (Discuss Ozempic /Sore on bottom of foot/)     Discussed the use of AI scribe software for clinical note transcription with the patient, who gave verbal consent to proceed.  History of Present Illness Jerry Arnold is a 39 year old male with a history of Type 2 DM (A1c 7.1), HTN, hyperlipidemia, status post right second toe amputation in 03/2017, Stage 3 CKD   who presents for follow-up and to discuss medication issues.  He has had poor diabetes control since switching from Mounjaro  to Ozempic  per insurance insurance. He stopped Ozempic  about a month ago because of severe nausea and abdominal pain and has been inconsistent with it. Since the change his A1c increased from 7.9 to 11.5 and he gained 20 pounds over six months. He currently uses Humulin  32 units twice daily plus Jardiance .  About two weeks ago he developed a sore on the bottom of his L foot after peeling a hard callus. It is painful with no significant drainage. He has not seen a podiatrist.  For hypertension he takes amlodipine  and lisinopril  hydrochlorothiazide  and is also on atorvastatin . He reports blood pressure is controlled.  He has financial barriers to nephrology follow-up and could not complete a recommended ultrasound due to cost. His [place] of [place]. He travels frequently to [place]  and is willing to see specialists there if that improves access.    Past Medical History:  Diagnosis Date   Hypertension    Osteomyelitis (HCC) 04/11/2017   with second toe on right foot injury x 2-3 weeks (04/11/2017)   Type II diabetes mellitus (HCC)     Past Surgical History:  Procedure Laterality Date   AMPUTATION Right 04/13/2017   Procedure: SECOND FOOT RAY AMPUTATION;  Surgeon: Harden Jerona GAILS, MD;  Location: [facility] OR;  Service: Orthopedics;  Laterality: Right;   NO PAST  SURGERIES      Family History  Problem Relation Age of Onset   Hypertension Mother    Diabetes Mother    Hypertension Maternal Grandmother    Hypertension Paternal Grandmother     Social History   Socioeconomic History   Marital status: Single    Spouse name: Not on file   Number of children: Not on file   Years of education: Not on file   Highest education level: Not on file  Occupational History   Not on file  Tobacco Use   Smoking status: Never   Smokeless tobacco: Never  Vaping Use   Vaping status: Never Used  Substance and Sexual Activity   Alcohol use: Yes    Comment: 04/11/2017 might have a few drinks/year   Drug use: No   Sexual activity: Yes  Other Topics Concern   Not on file  Social History Narrative   Not on file   Social Drivers of Health   Tobacco Use: Low Risk (03/03/2024)   Patient History    Smoking Tobacco Use: Never    Smokeless Tobacco Use: Never    Passive Exposure: Not on file  Financial Resource Strain: Not on file  Food Insecurity: Not on file  Transportation Needs: Not on file  Physical Activity: Not on file  Stress: Not on file  Social Connections: Not on file  Depression (PHQ2-9): Low Risk (08/26/2023)   Depression (PHQ2-9)    PHQ-2 Score: 0  Alcohol Screen: Not on  file  Housing: Not on file  Utilities: Not on file  Health Literacy: Not on file    Allergies[1]  Outpatient Medications Prior to Visit  Medication Sig Dispense Refill   Accu-Chek Softclix Lancets lancets Check blood sugar once daily. 100 each 2   acetaminophen  (TYLENOL ) 500 MG tablet Take 1 tablet (500 mg total) by mouth every 6 (six) hours as needed. 30 tablet 0   Blood Glucose Monitoring Suppl (ACCU-CHEK GUIDE) w/Device KIT Check blood sugar once daily. 1 kit 0   glucose blood (ACCU-CHEK GUIDE) test strip Check blood sugar once daily. 100 each 2   Insulin  Pen Needle (TRUEPLUS PEN NEEDLES) 32G X 4 MM MISC Use as directed to inject Trulicity  100 each 11   Insulin   Syringe-Needle U-100 (TRUEPLUS INSULIN  SYRINGE) 31G X 5/16 0.3 ML MISC Use as directed to administer insulin  twice daily 100 each 3   amLODipine  (NORVASC ) 10 MG tablet Take 1 tablet by mouth once daily 90 tablet 0   atorvastatin  (LIPITOR) 20 MG tablet Take 1 tablet (20 mg total) by mouth daily. 90 tablet 1   empagliflozin  (JARDIANCE ) 25 MG TABS tablet Take 1 tablet (25 mg total) by mouth daily before breakfast. 90 tablet 1   insulin  isophane & regular human KwikPen (HUMULIN  70/30 KWIKPEN) (70-30) 100 UNIT/ML KwikPen Inject 32 Units into the skin in the morning and at bedtime. 45 mL 6   lisinopril -hydrochlorothiazide  (ZESTORETIC ) 20-25 MG tablet Take 1 tablet by mouth daily. 90 tablet 1   Semaglutide ,0.25 or 0.5MG /DOS, (OZEMPIC , 0.25 OR 0.5 MG/DOSE,) 2 MG/3ML SOPN Inject 0.5 mg into the skin once a week. 3 mL 6   cyclobenzaprine  (FLEXERIL ) 10 MG tablet Take 1 tablet (10 mg total) by mouth 2 (two) times daily as needed for muscle spasms. (Patient not taking: Reported on 03/03/2024) 20 tablet 0   No facility-administered medications prior to visit.     ROS Review of Systems  Constitutional:  Negative for activity change and appetite change.  HENT:  Negative for sinus pressure and sore throat.   Respiratory:  Negative for chest tightness, shortness of breath and wheezing.   Cardiovascular:  Negative for chest pain and palpitations.  Gastrointestinal:  Positive for nausea. Negative for abdominal distention, abdominal pain and constipation.  Genitourinary: Negative.   Musculoskeletal: Negative.   Psychiatric/Behavioral:  Negative for behavioral problems and dysphoric mood.     Objective:  BP 112/76   Pulse 98   Temp 98.9 F (37.2 C) (Oral)   Ht 6' 1 (1.854 m)   Wt 260 lb (117.9 kg)   SpO2 98%   BMI 34.30 kg/m      03/03/2024   10:12 AM 08/26/2023    9:22 AM 02/26/2023    8:54 AM  BP/Weight  Systolic BP 112 111 99  Diastolic BP 76 77 67  Wt. (Lbs) 260 240 235.8  BMI 34.3 kg/m2 31.66  kg/m2 31.11 kg/m2      Physical Exam Constitutional:      Appearance: He is well-developed.  Cardiovascular:     Rate and Rhythm: Normal rate.     Heart sounds: Normal heart sounds. No murmur heard. Pulmonary:     Effort: Pulmonary effort is normal.     Breath sounds: Normal breath sounds. No wheezing or rales.  Chest:     Chest wall: No tenderness.  Abdominal:     General: Bowel sounds are normal. There is no distension.     Palpations: Abdomen is soft. There is no mass.  Tenderness: There is no abdominal tenderness.  Musculoskeletal:        General: Normal range of motion.     Right lower leg: No edema.     Left lower leg: No edema.     Comments: See HPI  Neurological:     Mental Status: He is alert and oriented to person, place, and time.  Psychiatric:        Mood and Affect: Mood normal.        Latest Ref Rng & Units 08/26/2023    9:59 AM 02/26/2023    9:24 AM 10/24/2022   11:11 AM  CMP  Glucose 70 - 99 mg/dL 836  64  98   BUN 6 - 20 mg/dL 26  39  39   Creatinine 0.76 - 1.27 mg/dL 8.01  8.07  8.16   Sodium 134 - 144 mmol/L 139  140  141   Potassium 3.5 - 5.2 mmol/L 5.1  4.4  4.9   Chloride 96 - 106 mmol/L 102  103  103   CO2 20 - 29 mmol/L 19  23  23    Calcium  8.7 - 10.2 mg/dL 9.6  9.4  9.5   Total Protein 6.0 - 8.5 g/dL 8.1  7.9  8.0   Total Bilirubin 0.0 - 1.2 mg/dL 0.5  0.4  0.4   Alkaline Phos 44 - 121 IU/L 152  141  145   AST 0 - 40 IU/L 14  17  13    ALT 0 - 44 IU/L 13  19  13      Lipid Panel     Component Value Date/Time   CHOL 148 08/26/2023 0959   TRIG 106 08/26/2023 0959   HDL 43 08/26/2023 0959   CHOLHDL 3.1 06/12/2021 0915   CHOLHDL 3.8 04/12/2017 0036   VLDL 18 04/12/2017 0036   LDLCALC 86 08/26/2023 0959    CBC    Component Value Date/Time   WBC 5.6 06/12/2021 0915   WBC 6.7 05/04/2021 1354   RBC 5.27 06/12/2021 0915   RBC 4.76 05/04/2021 1354   HGB 14.8 06/12/2021 0915   HCT 45.1 06/12/2021 0915   PLT 241 06/12/2021 0915   MCV  86 06/12/2021 0915   MCH 28.1 06/12/2021 0915   MCH 28.6 05/04/2021 1354   MCHC 32.8 06/12/2021 0915   MCHC 32.5 05/04/2021 1354   RDW 14.2 06/12/2021 0915   LYMPHSABS 2.7 06/12/2021 0915   MONOABS 0.8 05/04/2021 1354   EOSABS 0.3 06/12/2021 0915   BASOSABS 0.1 06/12/2021 0915    Lab Results  Component Value Date   HGBA1C 11.5 (A) 03/03/2024   Lab Results  Component Value Date   HGBA1C 11.5 (A) 03/03/2024   HGBA1C 7.9 (A) 08/26/2023   HGBA1C 7.1 (A) 02/26/2023        Assessment & Plan Type 2 diabetes mellitus with hyperglycemia and other specified complications A1c increased to 11.5 due to poor tolerance of Ozempic . Previous good response to Mounjaro . - Switched from Ozempic  to Mounjaro  5 mg weekly. - Instructed to monitor for adverse effects and adjust dose if necessary. - Coordinated with pharmacy for insurance approval of Mounjaro ; will likely need prior authorization.  Advised to notify me if Mounjaro  is not approved so we can increase his Humulin  70/30 dose - Continue Humulin  70/30 at 32 units twice daily. - Ordered comprehensive metabolic panel. -Counseled on Diabetic diet, the healthy plate, 849 minutes of moderate intensity exercise/week Blood sugar logs with fasting goals of 80-120 mg/dl, random  of less than 180 and in the event of sugars less than 60 mg/dl or greater than 599 mg/dl encouraged to notify the clinic. Advised on the need for annual eye exams, annual foot exams, Pneumonia vaccine.   Hypertension associated with stage 3a chronic kidney disease due to type 2 diabetes mellitus Blood pressure well-controlled. Difficulty accessing nephrology services. - Continue current antihypertensive regimen: amlodipine  and lisinopril -hydrochlorothiazide . - Referred to nephrologist in [place] .  Diabetic foot ulcer with skin breakdown Sore on foot, initially a callus, peeled two weeks ago. No infection, drainage or significant pain.  No antibiotic indicated -  Clean sore and perform warm soaks with salt. - Referred to podiatrist. - Monitor for signs of infection.  Hyperlipidemia associated with type 2 diabetes mellitus - Controlled - Continue statin - Low-cholesterol diet    Meds ordered this encounter  Medications   amLODipine  (NORVASC ) 10 MG tablet    Sig: Take 1 tablet (10 mg total) by mouth daily.    Dispense:  90 tablet    Refill:  0   tirzepatide  (MOUNJARO ) 5 MG/0.5ML Pen    Sig: Inject 5 mg into the skin once a week.    Dispense:  6 mL    Refill:  6    Discontinue Ozempic    tirzepatide  (MOUNJARO ) 7.5 MG/0.5ML Pen    Sig: Inject 7.5 mg into the skin once a week. For 4 weeks then increase to 10 mg once a week    Dispense:  6 mL    Refill:  0   tirzepatide  (MOUNJARO ) 10 MG/0.5ML Pen    Sig: Inject 10 mg into the skin once a week. For 4 weeks then increase to 12.5 mg once a week    Dispense:  6 mL    Refill:  0   atorvastatin  (LIPITOR) 20 MG tablet    Sig: Take 1 tablet (20 mg total) by mouth daily.    Dispense:  90 tablet    Refill:  1   empagliflozin  (JARDIANCE ) 25 MG TABS tablet    Sig: Take 1 tablet (25 mg total) by mouth daily before breakfast.    Dispense:  90 tablet    Refill:  1   insulin  isophane & regular human KwikPen (HUMULIN  70/30 KWIKPEN) (70-30) 100 UNIT/ML KwikPen    Sig: Inject 32 Units into the skin in the morning and at bedtime.    Dispense:  45 mL    Refill:  6   lisinopril -hydrochlorothiazide  (ZESTORETIC ) 20-25 MG tablet    Sig: Take 1 tablet by mouth daily.    Dispense:  90 tablet    Refill:  1    Follow-up: Return in about 3 months (around 06/01/2024) for Chronic medical conditions.       Corrina Sabin, MD, FAAFP. [facility] [place], [place] [phone]   03/03/2024, 11:16 AM    [1] No Known Allergies  "

## 2024-03-04 ENCOUNTER — Ambulatory Visit: Payer: Self-pay | Admitting: Family Medicine

## 2024-03-04 LAB — CMP14+EGFR
ALT: 22 IU/L (ref 0–44)
AST: 16 IU/L (ref 0–40)
Albumin: 4.5 g/dL (ref 4.1–5.1)
Alkaline Phosphatase: 139 IU/L — ABNORMAL HIGH (ref 47–123)
BUN/Creatinine Ratio: 20 (ref 9–20)
BUN: 43 mg/dL — ABNORMAL HIGH (ref 6–20)
Bilirubin Total: 0.5 mg/dL (ref 0.0–1.2)
CO2: 18 mmol/L — ABNORMAL LOW (ref 20–29)
Calcium: 9.7 mg/dL (ref 8.7–10.2)
Chloride: 100 mmol/L (ref 96–106)
Creatinine, Ser: 2.14 mg/dL — ABNORMAL HIGH (ref 0.76–1.27)
Globulin, Total: 3.4 g/dL (ref 1.5–4.5)
Glucose: 253 mg/dL — ABNORMAL HIGH (ref 70–99)
Potassium: 5.1 mmol/L (ref 3.5–5.2)
Sodium: 135 mmol/L (ref 134–144)
Total Protein: 7.9 g/dL (ref 6.0–8.5)
eGFR: 39 mL/min/1.73 — ABNORMAL LOW

## 2024-03-04 LAB — MICROALBUMIN / CREATININE URINE RATIO
Creatinine, Urine: 93.3 mg/dL
Microalb/Creat Ratio: 12 mg/g{creat} (ref 0–29)
Microalbumin, Urine: 11 ug/mL

## 2024-03-05 ENCOUNTER — Telehealth: Payer: Self-pay

## 2024-03-05 NOTE — Telephone Encounter (Signed)
 Please advise

## 2024-03-05 NOTE — Telephone Encounter (Signed)
 Copied from CRM #[id number]. Topic: Clinical - Medication Prior Auth >> Mar 05, 2024 12:51 PM Hadassah PARAS wrote: Reason for CRM: Pt is needing PA for Med tirzepatide  (MOUNJARO ). Please advise pt on #[id number]

## 2024-03-06 NOTE — Telephone Encounter (Signed)
 Noted

## 2024-03-11 ENCOUNTER — Telehealth: Payer: Self-pay

## 2024-03-11 ENCOUNTER — Other Ambulatory Visit: Payer: Self-pay

## 2024-03-11 NOTE — Telephone Encounter (Addendum)
 Good morning, Kelly.  Do you have any updates regarding the PA? Thanks.

## 2024-03-11 NOTE — Telephone Encounter (Signed)
 Pharmacy Patient Advocate Encounter  Received notification from [employer] that Prior Authorization for MOUNJARO  has been APPROVED from 03/11/2024 to 09/07/2024   PA #/Case ID/Reference #: [id number]

## 2024-03-11 NOTE — Telephone Encounter (Addendum)
 Good morning, Jerry Arnold.  Can you please assist with new referral for Podiatry? Thank you!

## 2024-03-11 NOTE — Telephone Encounter (Signed)
 Copied from CRM #[id number]. Topic: Referral - Status >> Mar 11, 2024 10:24 AM   Jerry Arnold wrote:  Pt needs another foot specialist bc the place the referral was sent to don't take his insurance Pt is also needing the status of the Mounjaro 

## 2024-03-13 NOTE — Telephone Encounter (Unsigned)
 Copied from CRM [id number]. Topic: Clinical - Prescription Issue >> Mar 13, 2024  3:37 PM Fonda T wrote: Reason for CRM: Pt calling, reports he went to pic up medication, tirzepatide  (MOUNJARO ) 5 MG/0.5ML Pen at pharamacy, and medication was denied. Per pt reports pharamcy needs PA.  Per pt he was informed previously that medication was approved through insurance as of 03/11/24.  Per chart review:  Received notification from [employer] that Prior Authorization for MOUNJARO  has been APPROVED from 03/11/2024 to 09/07/2024  Pt requesting a follow up call to advise, as he states pharmacy informed medication is not covered.  Can be reached back at [phone] To discuss further.   Pt aware of follow call.    [facility] - [place], [place] - [address] [place] [address] Phone: [phone] Fax: [phone]

## 2024-03-13 NOTE — Telephone Encounter (Signed)
 Patient has been contacted by pharmacy to pick up his medication.

## 2024-06-01 ENCOUNTER — Ambulatory Visit: Payer: Self-pay | Admitting: Family Medicine
